# Patient Record
Sex: Female | Born: 1940 | Race: White | Hispanic: No | Marital: Married | Smoking: Never smoker
Health system: Southern US, Community
[De-identification: ages and names within clinical notes are randomized; demographics above are authoritative.]

## PROBLEM LIST (undated history)

## (undated) DIAGNOSIS — R1900 Intra-abdominal and pelvic swelling, mass and lump, unspecified site: Secondary | ICD-10-CM

## (undated) DIAGNOSIS — E7800 Pure hypercholesterolemia, unspecified: Secondary | ICD-10-CM

## (undated) DIAGNOSIS — I10 Essential (primary) hypertension: Secondary | ICD-10-CM

## (undated) DIAGNOSIS — C569 Malignant neoplasm of unspecified ovary: Secondary | ICD-10-CM

## (undated) HISTORY — DX: Intra-abdominal and pelvic swelling, mass and lump, unspecified site: R19.00

## (undated) HISTORY — DX: Malignant neoplasm of unspecified ovary: C56.9

## (undated) HISTORY — DX: Pure hypercholesterolemia, unspecified: E78.00

## (undated) HISTORY — DX: Essential (primary) hypertension: I10

---

## 1998-10-17 ENCOUNTER — Encounter: Payer: Self-pay | Admitting: Internal Medicine

## 1998-10-17 ENCOUNTER — Ambulatory Visit (HOSPITAL_COMMUNITY): Admission: RE | Admit: 1998-10-17 | Discharge: 1998-10-17 | Payer: Self-pay | Admitting: Internal Medicine

## 1999-10-30 ENCOUNTER — Encounter: Payer: Self-pay | Admitting: Internal Medicine

## 1999-10-30 ENCOUNTER — Ambulatory Visit (HOSPITAL_COMMUNITY): Admission: RE | Admit: 1999-10-30 | Discharge: 1999-10-30 | Payer: Self-pay | Admitting: Internal Medicine

## 1999-11-06 ENCOUNTER — Emergency Department (HOSPITAL_COMMUNITY): Admission: EM | Admit: 1999-11-06 | Discharge: 1999-11-06 | Payer: Self-pay | Admitting: Emergency Medicine

## 2000-12-02 ENCOUNTER — Ambulatory Visit (HOSPITAL_COMMUNITY): Admission: RE | Admit: 2000-12-02 | Discharge: 2000-12-02 | Payer: Self-pay | Admitting: Internal Medicine

## 2000-12-02 ENCOUNTER — Encounter: Payer: Self-pay | Admitting: Internal Medicine

## 2001-12-08 ENCOUNTER — Encounter: Payer: Self-pay | Admitting: Internal Medicine

## 2001-12-08 ENCOUNTER — Ambulatory Visit (HOSPITAL_COMMUNITY): Admission: RE | Admit: 2001-12-08 | Discharge: 2001-12-08 | Payer: Self-pay | Admitting: Internal Medicine

## 2003-01-11 ENCOUNTER — Ambulatory Visit (HOSPITAL_COMMUNITY): Admission: RE | Admit: 2003-01-11 | Discharge: 2003-01-11 | Payer: Self-pay | Admitting: Obstetrics and Gynecology

## 2003-01-11 ENCOUNTER — Encounter: Payer: Self-pay | Admitting: Obstetrics and Gynecology

## 2004-06-27 ENCOUNTER — Encounter: Admission: RE | Admit: 2004-06-27 | Discharge: 2004-06-27 | Payer: Self-pay | Admitting: Internal Medicine

## 2005-07-08 ENCOUNTER — Ambulatory Visit (HOSPITAL_COMMUNITY): Admission: RE | Admit: 2005-07-08 | Discharge: 2005-07-08 | Payer: Self-pay | Admitting: Internal Medicine

## 2005-07-30 ENCOUNTER — Encounter: Admission: RE | Admit: 2005-07-30 | Discharge: 2005-07-30 | Payer: Self-pay | Admitting: Internal Medicine

## 2006-07-31 ENCOUNTER — Encounter: Admission: RE | Admit: 2006-07-31 | Discharge: 2006-07-31 | Payer: Self-pay | Admitting: Internal Medicine

## 2010-09-21 ENCOUNTER — Emergency Department (HOSPITAL_COMMUNITY): Payer: Medicare Other

## 2010-09-21 ENCOUNTER — Inpatient Hospital Stay (HOSPITAL_COMMUNITY)
Admission: EM | Admit: 2010-09-21 | Discharge: 2010-09-24 | DRG: 690 | Disposition: A | Discharge status: Home / Self Care | Payer: Medicare Other | Attending: Internal Medicine | Admitting: Internal Medicine

## 2010-09-21 DIAGNOSIS — Z79899 Other long term (current) drug therapy: Secondary | ICD-10-CM

## 2010-09-21 DIAGNOSIS — E785 Hyperlipidemia, unspecified: Secondary | ICD-10-CM | POA: Diagnosis present

## 2010-09-21 DIAGNOSIS — I10 Essential (primary) hypertension: Secondary | ICD-10-CM | POA: Diagnosis present

## 2010-09-21 DIAGNOSIS — N179 Acute kidney failure, unspecified: Secondary | ICD-10-CM | POA: Diagnosis present

## 2010-09-21 DIAGNOSIS — Z7982 Long term (current) use of aspirin: Secondary | ICD-10-CM

## 2010-09-21 DIAGNOSIS — N12 Tubulo-interstitial nephritis, not specified as acute or chronic: Principal | ICD-10-CM | POA: Diagnosis present

## 2010-09-21 DIAGNOSIS — E876 Hypokalemia: Secondary | ICD-10-CM | POA: Diagnosis present

## 2010-09-21 LAB — COMPREHENSIVE METABOLIC PANEL
ALT: 15 U/L (ref 0–35)
AST: 24 U/L (ref 0–37)
Alkaline Phosphatase: 69 U/L (ref 39–117)
BUN: 43 mg/dL — ABNORMAL HIGH (ref 6–23)
CO2: 27 mEq/L (ref 19–32)
Calcium: 9.3 mg/dL (ref 8.4–10.5)
Creatinine, Ser: 2.66 mg/dL — ABNORMAL HIGH (ref 0.4–1.2)
GFR calc Af Amer: 22 mL/min — ABNORMAL LOW (ref >60)
GFR calc non Af Amer: 18 mL/min — ABNORMAL LOW (ref >60)
Glucose, Bld: 156 mg/dL — ABNORMAL HIGH (ref 70–99)
Potassium: 2.8 mEq/L — ABNORMAL LOW (ref 3.5–5.1)
Sodium: 131 mEq/L — ABNORMAL LOW (ref 135–145)
Total Protein: 7 g/dL (ref 6.0–8.3)

## 2010-09-21 LAB — DIFFERENTIAL
Basophils Absolute: 0 10*3/uL (ref 0.0–0.1)
Basophils Relative: 0 % (ref 0–1)
Eosinophils Absolute: 0 10*3/uL (ref 0.0–0.7)
Eosinophils Relative: 0 % (ref 0–5)
Lymphocytes Relative: 4 % — ABNORMAL LOW (ref 12–46)
Lymphs Abs: 0.6 10*3/uL — ABNORMAL LOW (ref 0.7–4.0)
Monocytes Absolute: 0.8 10*3/uL (ref 0.1–1.0)
Monocytes Relative: 6 % (ref 3–12)
Neutro Abs: 12.6 10*3/uL — ABNORMAL HIGH (ref 1.7–7.7)
Neutrophils Relative %: 90 % — ABNORMAL HIGH (ref 43–77)

## 2010-09-21 LAB — URINALYSIS, ROUTINE W REFLEX MICROSCOPIC
Ketones, ur: NEGATIVE mg/dL
Nitrite: NEGATIVE
Protein, ur: 100 mg/dL — AB
Specific Gravity, Urine: 1.018 (ref 1.005–1.030)
Urine Glucose, Fasting: NEGATIVE mg/dL
pH: 5.5 (ref 5.0–8.0)

## 2010-09-21 LAB — URINE MICROSCOPIC-ADD ON

## 2010-09-21 LAB — LIPASE, BLOOD: Lipase: 20 U/L (ref 11–59)

## 2010-09-21 LAB — CBC
HCT: 37.3 % (ref 36.0–46.0)
Hemoglobin: 13.1 g/dL (ref 12.0–15.0)
MCHC: 35.1 g/dL (ref 30.0–36.0)
WBC: 14 10*3/uL — ABNORMAL HIGH (ref 4.0–10.5)

## 2010-09-21 LAB — LACTIC ACID, PLASMA: Lactic Acid, Venous: 1.1 mmol/L (ref 0.5–2.2)

## 2010-09-22 ENCOUNTER — Other Ambulatory Visit: Payer: Self-pay | Admitting: Internal Medicine

## 2010-09-22 LAB — COMPREHENSIVE METABOLIC PANEL
ALT: 23 U/L (ref 0–35)
AST: 43 U/L — ABNORMAL HIGH (ref 0–37)
Albumin: 2.1 g/dL — ABNORMAL LOW (ref 3.5–5.2)
Alkaline Phosphatase: 64 U/L (ref 39–117)
CO2: 24 mEq/L (ref 19–32)
Calcium: 8.4 mg/dL (ref 8.4–10.5)
Creatinine, Ser: 2.08 mg/dL — ABNORMAL HIGH (ref 0.4–1.2)
GFR calc Af Amer: 29 mL/min — ABNORMAL LOW (ref >60)
GFR calc non Af Amer: 24 mL/min — ABNORMAL LOW (ref >60)
Potassium: 3 mEq/L — ABNORMAL LOW (ref 3.5–5.1)
Total Bilirubin: 0.8 mg/dL (ref 0.3–1.2)
Total Protein: 6.1 g/dL (ref 6.0–8.3)

## 2010-09-22 LAB — CBC
HCT: 36.8 % (ref 36.0–46.0)
MCH: 30 pg (ref 26.0–34.0)
MCHC: 34 g/dL (ref 30.0–36.0)
Platelets: 174 10*3/uL (ref 150–400)
RBC: 4.17 MIL/uL (ref 3.87–5.11)
RDW: 12.8 % (ref 11.5–15.5)

## 2010-09-23 LAB — CBC
HCT: 33.5 % — ABNORMAL LOW (ref 36.0–46.0)
Hemoglobin: 11.6 g/dL — ABNORMAL LOW (ref 12.0–15.0)
MCH: 30.4 pg (ref 26.0–34.0)
MCHC: 34.6 g/dL (ref 30.0–36.0)
MCV: 87.7 fL (ref 78.0–100.0)
Platelets: 209 10*3/uL (ref 150–400)
RBC: 3.82 MIL/uL — ABNORMAL LOW (ref 3.87–5.11)
RDW: 12.8 % (ref 11.5–15.5)
WBC: 12.3 10*3/uL — ABNORMAL HIGH (ref 4.0–10.5)

## 2010-09-23 LAB — DIFFERENTIAL
Basophils Absolute: 0 10*3/uL (ref 0.0–0.1)
Basophils Relative: 0 % (ref 0–1)
Eosinophils Absolute: 0.1 10*3/uL (ref 0.0–0.7)
Eosinophils Relative: 1 % (ref 0–5)
Lymphocytes Relative: 11 % — ABNORMAL LOW (ref 12–46)
Lymphs Abs: 1.4 10*3/uL (ref 0.7–4.0)
Monocytes Absolute: 1.4 10*3/uL — ABNORMAL HIGH (ref 0.1–1.0)
Monocytes Relative: 11 % (ref 3–12)
Neutro Abs: 9.4 10*3/uL — ABNORMAL HIGH (ref 1.7–7.7)
Neutrophils Relative %: 77 % (ref 43–77)

## 2010-09-23 LAB — BASIC METABOLIC PANEL
BUN: 25 mg/dL — ABNORMAL HIGH (ref 6–23)
CO2: 23 mEq/L (ref 19–32)
Calcium: 8.3 mg/dL — ABNORMAL LOW (ref 8.4–10.5)
Chloride: 102 mEq/L (ref 96–112)
Creatinine, Ser: 1.68 mg/dL — ABNORMAL HIGH (ref 0.4–1.2)
GFR calc Af Amer: 37 mL/min — ABNORMAL LOW (ref >60)
GFR calc non Af Amer: 30 mL/min — ABNORMAL LOW (ref >60)
Glucose, Bld: 107 mg/dL — ABNORMAL HIGH (ref 70–99)
Potassium: 3.3 mEq/L — ABNORMAL LOW (ref 3.5–5.1)
Sodium: 134 mEq/L — ABNORMAL LOW (ref 135–145)

## 2010-09-24 LAB — CBC
MCH: 29.7 pg (ref 26.0–34.0)
MCHC: 33.9 g/dL (ref 30.0–36.0)
MCV: 87.5 fL (ref 78.0–100.0)
Platelets: 239 10*3/uL (ref 150–400)
RBC: 3.91 MIL/uL (ref 3.87–5.11)
RDW: 13 % (ref 11.5–15.5)
WBC: 13 10*3/uL — ABNORMAL HIGH (ref 4.0–10.5)

## 2010-09-24 LAB — URINE CULTURE
Colony Count: NO GROWTH
Culture: NO GROWTH

## 2010-09-24 LAB — BASIC METABOLIC PANEL
BUN: 21 mg/dL (ref 6–23)
CO2: 23 mEq/L (ref 19–32)
Calcium: 8.7 mg/dL (ref 8.4–10.5)
Chloride: 108 mEq/L (ref 96–112)
Creatinine, Ser: 1.54 mg/dL — ABNORMAL HIGH (ref 0.4–1.2)
GFR calc Af Amer: 41 mL/min — ABNORMAL LOW (ref >60)
GFR calc non Af Amer: 34 mL/min — ABNORMAL LOW (ref >60)
Glucose, Bld: 106 mg/dL — ABNORMAL HIGH (ref 70–99)
Potassium: 3.5 mEq/L (ref 3.5–5.1)

## 2010-09-28 LAB — CULTURE, BLOOD (ROUTINE X 2)
Culture  Setup Time: 201202050047
Culture: NO GROWTH
Culture: NO GROWTH

## 2010-10-07 NOTE — Discharge Summary (Signed)
Joanne Copeland, Joanne Copeland                 ACCOUNT NO.:  [id number]  MEDICAL RECORD NO.:  [id number]           PATIENT TYPE:  I  LOCATION:  1503                         FACILITY:  [facility]  PHYSICIAN:  Jeoffrey Massed, MD    DATE OF BIRTH:  1940/09/13  DATE OF ADMISSION:  09/21/2010 DATE OF DISCHARGE:                        DISCHARGE SUMMARY - REFERRING   PRIMARY CARE PRACTITIONER:  Dr. Gwenyth Bender in [place], [place].  PRIMARY DISCHARGE DIAGNOSES: 1. Bilateral pyelonephritis. 2. Acute renal failure.  SECONDARY DISCHARGE DIAGNOSES: 1. Hypertension. 2. Dyslipidemia.  DISCHARGE MEDICATIONS: 1. Ciprofloxacin 500 mg 1 tablet p.o. twice daily for 7 more days. 2. Aspirin 81 mg 1 tablet p.o. daily. 3. Atenolol 25 mg 1 tablet p.o. daily. 4. Benadryl 25 mg 1 tablet p.o. daily p.r.n. 5. Crestor 5 mg 1 tablet p.o. at bedtime. 6. Ibuprofen 200 mg 2 tablets p.o. daily p.r.n. 7. Fenofibrate acid 45 mg 1 capsule p.o. at bedtime.  CONSULTATIONS:  None.  BRIEF HPI:  The patient is a very pleasant 70 year old female who is from [place]. [place], [place], and was here visiting her daughter when she started having nausea, vomiting, and lower abdominal pain along with fever.  She was then evaluated in the emergency room and was found to have urinary tract infection consistent with pyelonephritis.  She was then admitted to the hospitalist service for further evaluation and treatment.  PERTINENT LABORATORY DATA: 1. Urine culture negative but this was done when the patient was     already on antibiotics. 2. Blood cultures are negative to date. 3. Discharge laboratory data shows a WBC of 13, hemoglobin of 11.6,     and a platelet count of 239, 000. 4. Chemistries on discharge shows a creatinine of 1.54. 5. CBC on admission showed a WBC of 14.0 and a creatinine of 2.66.  RADIOLOGICAL STUDIES:  CT of the abdomen and pelvis showed kidneys appeared to be swollen with perinephric edema.  Findings  suggest bilateral pyelonephritis.  There is no evidence of stone disease or hydroureteronephrosis.  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  BRIEF HOSPITAL COURSE: 1. Bilateral pyelonephritis.  The patient presented with a few days'     history of nausea, vomiting, abdominal pain, and fever.  She had     evidence of leukocytosis on her labs as well as acute renal     failure.  Her urinalysis was positive for leukocytes, and a     microscopic analysis showed many bacteria with too numerous to     count wbc's and rbc's.  Her symptomatology and radiological studies     were consistent with urinary tract infection.  She was then started     on IV fluids and ciprofloxacin.  Urine cultures were obtained, and     blood cultures were also obtained.  However, there are so far     negative.  However, the patient has seen significant clinical     improvement and has been afebrile for the past 48 hours.  Her renal     failure has also been down  trending.  The plan at this point is to     discontinue IV ciprofloxacin and then to transition to p.o.     ciprofloxacin for 7 more days starting from tomorrow.  She has been     told to return to the emergency room if she redevelops some of     these symptoms when she goes home.  Otherwise, she will follow up     with her primary care practitioner in [place] in the next 1-2     weeks. 2. Acute renal failure.  This is almost resolved with IV fluids.  The     patient does not have any nausea or vomiting and is now able to     tolerate oral diet with no issues at all.  It is suggested that she     continue to keep herself well hydrated and not to take any     nonsteroidal anti-inflammatory medications until she sees her     primary care practitioner.  She will need repeat chemistries in the     next few weeks to make sure that her renal failure has totally     resolved. 3.  Hypertension.  This has been stable.  Because of her renal failure,     her diuretics were on hold.  She was only continued on atenolol.     Her blood pressure is controlled only with atenolol while here in     the hospital.  She will follow up with her primary care     practitioner in the next few weeks to see if she needs to come back     on to the diuretics. 4. Dyslipidemia.  This was stable.  She is to resume her usual     medications.  DISPOSITION:  The patient will be discharged home today.  FOLLOWUP INSTRUCTIONS: 1. The patient will follow up with her primary care practitioner     within 1-2 weeks upon discharge.  The patient will need repeat     chemistries at the time of followup. 2. The patient is to return to any local emergency room if she has     recurrence of these symptoms prior to her returning back to     [place].  TOTAL TIME SPENT:  45 minutes.     Jeoffrey Massed, MD     SG/MEDQ  D:  09/24/2010  T:  09/24/2010  Job:  [id number]  cc:   Dr. Gwenyth Bender [address] [place], [address] [place]. [place] [address]  Electronically Signed by Jeoffrey Massed  on 10/07/2010 04:23:26 PM

## 2010-10-09 NOTE — H&P (Signed)
NAMEABIAGEAL, BLOWE NO.:  [id number]  MEDICAL RECORD NO.:  [id number]           PATIENT TYPE:  I  LOCATION:  1503                         FACILITY:  [facility]  PHYSICIAN:  Massie Maroon, MD        DATE OF BIRTH:  05/06/1941  DATE OF ADMISSION:  09/21/2010 DATE OF DISCHARGE:                             HISTORY & PHYSICAL   CHIEF COMPLAINT:  Nausea, vomiting, shaking, and lower abdominal cramping.  HISTORY OF PRESENT ILLNESS:  A 70 year old female with a history of hypertension, hyperlipidemia, apparently started having stomach cramping which she describes as bilateral lower abdominal cramps and shaking and freezing and fever starting Wednesday.  It was unremitting and then she developed nausea and vomiting (no blood) and continued to have this lower abdominal cramping.  She presented to the ED today because of continued fever and was noted to have urinary tract infection as well as evidence of pyelonephritis on CT scan.  The patient will be admitted for nausea, vomiting, and pyelonephritis.  PAST MEDICAL HISTORY: 1. Hypertension. 2. Hyperlipidemia.  PAST SURGICAL HISTORY:  Vein stripping.  SOCIAL HISTORY:  The patient does not smoke.  She occasionally drinks socially.  She has 11 siblings, 8 of whom are alive.  FAMILY HISTORY:  Mother died of stroke at age 52, was a nonsmoker.  Her father died at age 69 of heart attack and was a smoker.  ALLERGIES:  HONEYDEW AND WATERMELON AND CANTALOUPE, but no known drug allergies.  MEDICATIONS: 1. Enteric-coated aspirin 81 mg p.o. daily. 2. Atenolol 25 mg p.o. daily. 3. Triamterene/hydrochlorothiazide 37.5/25 mg 1 p.o. daily. 4. Crestor 5 mg p.o. daily. 5. Trilipix 45 mg p.o. daily  REVIEW OF SYSTEMS:  Negative for all 10 organ systems except for pertinent positives stated above, no evidence of gross hematuria.  PHYSICAL EXAMINATION:  VITAL SIGNS:  Temperature 98.5, pulse 63, blood pressure 98/64, pulse oximetry  100%. HEENT:  Anicteric, EOMI, no nystagmus, pupils 1.5 mm, symmetric direct consensual, near reflexes intact.  Mucous membranes moist. NECK:  No JVD.  No bruit.  No thyromegaly.  No adenopathy. HEART:  Regular rate and rhythm.  S1 and S2.  No murmurs, gallops, or rubs. LUNGS:  Clear to auscultation bilaterally. ABDOMEN:  Soft, nontender, nondistended.  Positive bowel sounds. EXTREMITIES:  No cyanosis, clubbing, or edema. SKIN:  No rashes, lymph nodes, or adenopathy. NEURO EXAM:  Nonfocal, cranial nerves II through XII intact.  Reflexes 2+, symmetric, diffuse with downgoing toes bilaterally, motor strength 5/5 in all 4 extremities, pinprick intact.  LABORATORY DATA:  WBC 14.0, hemoglobin 13.1, platelet count 193.  Sodium 131, potassium 2.8 (low), BUN 43, creatinine 2.66, AST 24, ALT 15, glucose 156.  Lipase 20.  Urinalysis, nitrite negative, leukocyte esterase large.  Urinalysis, WBCs too numerous to count, RBCs too numerous to count, granular casts.  Lactic acid 1.1.  CT scan of the abdomen and pelvis shows, impression, the kidneys appear swollen and there is perinephric edema, suggestive of bilateral pyelonephritis.  I do not see evidence of stone disease or hydronephrosis.  Incidentally, the patient apprise 1 cm angiomyolipoma dorsally  in the right kidney, not of acute clinical relevance.  ASSESSMENT/PLAN: 1. Pyelonephritis:  Blood cultures x2 sets pending.  Urine culture     obviously pending.  The patient placed on Cipro IV, pharmacy to     dose. 2. Acute renal failure:  Check urine sodium, urine creatinine, urine     eosinophils.  The patient's kidney function should improve with     hydration.  If not, may need further evaluation obviously.  We will     also discontinue triamterene/hydrochlorothiazide for now. 3. Hypokalemia:  Replete potassium. 4. Hypertension.  Continue atenolol. 5. Hyperlipidemia.  Continue Crestor and Trilipix. 6. Hyponatremia:  Should resolve with  normal saline.  However, if the     patient remains persistently hyponatremic, consider checking a     serum osm, TSH, cortisol, urine osm, urine sodium. 7. Deep venous thrombosis prophylaxis.  SCDs.     Massie Maroon, MD     JYK/MEDQ  D:  09/21/2010  T:  09/22/2010  Job:  [id number]  cc:   Dr. Mervin Kung, [facility]  Electronically Signed by Pearson Grippe MD on 10/09/2010 02:06:27 PM

## 2014-01-05 DIAGNOSIS — C5700 Malignant neoplasm of unspecified fallopian tube: Secondary | ICD-10-CM | POA: Insufficient documentation

## 2014-01-05 HISTORY — PX: OTHER SURGICAL HISTORY: SHX169

## 2014-01-05 HISTORY — PX: TOTAL ABDOMINAL HYSTERECTOMY W/ BILATERAL SALPINGOOPHORECTOMY: SHX83

## 2014-02-07 ENCOUNTER — Encounter: Payer: Self-pay | Admitting: *Deleted

## 2014-02-09 ENCOUNTER — Telehealth: Payer: Self-pay | Admitting: Oncology

## 2014-02-09 ENCOUNTER — Ambulatory Visit: Payer: Medicare Other | Attending: Gynecologic Oncology | Admitting: Gynecologic Oncology

## 2014-02-09 ENCOUNTER — Encounter: Payer: Self-pay | Admitting: Gynecologic Oncology

## 2014-02-09 VITALS — BP 160/76 | HR 85 | Temp 97.9°F | Resp 18 | Ht 64.5 in | Wt 152.0 lb

## 2014-02-09 DIAGNOSIS — C569 Malignant neoplasm of unspecified ovary: Secondary | ICD-10-CM | POA: Insufficient documentation

## 2014-02-09 DIAGNOSIS — E7800 Pure hypercholesterolemia, unspecified: Secondary | ICD-10-CM | POA: Insufficient documentation

## 2014-02-09 DIAGNOSIS — C563 Malignant neoplasm of bilateral ovaries: Secondary | ICD-10-CM

## 2014-02-09 DIAGNOSIS — Z7982 Long term (current) use of aspirin: Secondary | ICD-10-CM | POA: Insufficient documentation

## 2014-02-09 DIAGNOSIS — Z79899 Other long term (current) drug therapy: Secondary | ICD-10-CM | POA: Insufficient documentation

## 2014-02-09 DIAGNOSIS — C5700 Malignant neoplasm of unspecified fallopian tube: Secondary | ICD-10-CM

## 2014-02-09 DIAGNOSIS — C561 Malignant neoplasm of right ovary: Secondary | ICD-10-CM

## 2014-02-09 DIAGNOSIS — C562 Malignant neoplasm of left ovary: Secondary | ICD-10-CM

## 2014-02-09 DIAGNOSIS — Z90710 Acquired absence of both cervix and uterus: Secondary | ICD-10-CM | POA: Insufficient documentation

## 2014-02-09 DIAGNOSIS — I10 Essential (primary) hypertension: Secondary | ICD-10-CM | POA: Insufficient documentation

## 2014-02-09 NOTE — Telephone Encounter (Signed)
LEFT MESSAGE FOR PATIENT TO RETURN CALL TO SCHEDULE NP APPT.  °

## 2014-02-09 NOTE — Patient Instructions (Signed)
Follow-up with Dr. Marko Plume Please consider reading material at [place].gov for more information about your cancer.  Follow-up with Gyn Onc in 2 months  Thank you very much Ms. KHILA PAPP for allowing me to provide care for you today.  I appreciate your confidence in choosing our Gynecologic Oncology team.  If you have any questions about your visit today please call our office and we will get back to you as soon as possible.  Francetta Found. Brewster MD., PhD Gynecologic Oncology
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
## 2014-02-10 ENCOUNTER — Telehealth: Payer: Self-pay | Admitting: Oncology

## 2014-02-10 NOTE — Telephone Encounter (Signed)
S/W PATIENT AND GAVE NP APPT FOR 07/08 @ 3 W/DR. LIVESAY.  REFERRING DR. Abigail Butts BREWSTER DX- OVARIAN CA, UNSPECIFIED LATERALITY

## 2014-02-10 NOTE — Telephone Encounter (Signed)
C/D 02/10/14 for appt. 02/22/14

## 2014-02-21 ENCOUNTER — Other Ambulatory Visit: Payer: Self-pay | Admitting: Oncology

## 2014-02-21 DIAGNOSIS — C5700 Malignant neoplasm of unspecified fallopian tube: Secondary | ICD-10-CM

## 2014-02-22 ENCOUNTER — Encounter: Payer: Self-pay | Admitting: Oncology

## 2014-02-22 ENCOUNTER — Encounter: Payer: Self-pay | Admitting: *Deleted

## 2014-02-22 ENCOUNTER — Other Ambulatory Visit: Payer: Self-pay | Admitting: Oncology

## 2014-02-22 ENCOUNTER — Telehealth: Payer: Self-pay | Admitting: Oncology

## 2014-02-22 ENCOUNTER — Encounter: Payer: Self-pay | Admitting: General Practice

## 2014-02-22 ENCOUNTER — Ambulatory Visit: Payer: Medicare Other

## 2014-02-22 ENCOUNTER — Ambulatory Visit (HOSPITAL_BASED_OUTPATIENT_CLINIC_OR_DEPARTMENT_OTHER): Payer: Medicare Other | Admitting: Oncology

## 2014-02-22 ENCOUNTER — Other Ambulatory Visit (HOSPITAL_BASED_OUTPATIENT_CLINIC_OR_DEPARTMENT_OTHER): Payer: Medicare Other

## 2014-02-22 ENCOUNTER — Telehealth: Payer: Self-pay | Admitting: *Deleted

## 2014-02-22 ENCOUNTER — Other Ambulatory Visit: Payer: Medicare Other

## 2014-02-22 VITALS — BP 175/85 | HR 85 | Temp 98.2°F | Resp 18 | Ht 64.0 in | Wt 152.0 lb

## 2014-02-22 DIAGNOSIS — C5700 Malignant neoplasm of unspecified fallopian tube: Secondary | ICD-10-CM

## 2014-02-22 DIAGNOSIS — C5702 Malignant neoplasm of left fallopian tube: Secondary | ICD-10-CM

## 2014-02-22 LAB — CBC WITH DIFFERENTIAL/PLATELET
BASO%: 0.7 % (ref 0.0–2.0)
Basophils Absolute: 0.1 10*3/uL (ref 0.0–0.1)
EOS%: 3.2 % (ref 0.0–7.0)
Eosinophils Absolute: 0.2 10*3/uL (ref 0.0–0.5)
HCT: 43.4 % (ref 34.8–46.6)
HGB: 14.1 g/dL (ref 11.6–15.9)
LYMPH%: 29.5 % (ref 14.0–49.7)
MCH: 29.7 pg (ref 25.1–34.0)
MCHC: 32.4 g/dL (ref 31.5–36.0)
MCV: 91.6 fL (ref 79.5–101.0)
MONO#: 0.6 10*3/uL (ref 0.1–0.9)
MONO%: 8.8 % (ref 0.0–14.0)
NEUT#: 4.1 10*3/uL (ref 1.5–6.5)
NEUT%: 57.8 % (ref 38.4–76.8)
PLATELETS: 276 10*3/uL (ref 145–400)
RBC: 4.73 10*6/uL (ref 3.70–5.45)
RDW: 16.3 % — ABNORMAL HIGH (ref 11.2–14.5)
WBC: 7.1 10*3/uL (ref 3.9–10.3)
lymph#: 2.1 10*3/uL (ref 0.9–3.3)

## 2014-02-22 LAB — COMPREHENSIVE METABOLIC PANEL (CC13)
ALT: 13 U/L (ref 0–55)
AST: 17 U/L (ref 5–34)
Albumin: 4 g/dL (ref 3.5–5.0)
Alkaline Phosphatase: 53 U/L (ref 40–150)
Anion Gap: 10 mEq/L (ref 3–11)
BUN: 17.8 mg/dL (ref 7.0–26.0)
CALCIUM: 10.2 mg/dL (ref 8.4–10.4)
CO2: 28 meq/L (ref 22–29)
Chloride: 100 mEq/L (ref 98–109)
Creatinine: 1 mg/dL (ref 0.6–1.1)
Glucose: 120 mg/dl (ref 70–140)
POTASSIUM: 4 meq/L (ref 3.5–5.1)
Sodium: 139 mEq/L (ref 136–145)
Total Bilirubin: 0.5 mg/dL (ref 0.20–1.20)
Total Protein: 7.3 g/dL (ref 6.4–8.3)

## 2014-02-22 NOTE — Telephone Encounter (Signed)
Patient scheduled as a new patient to see Dr. Marko Plume today for discussion of chemotherapy.  Dr. Marko Plume requests that patient attend chemotherapy class prior to her appointment. I arranged for patient to have a class today at 2:00 PM.  I called to discuss with patient who said she would come for the class at 2:00.  Scheduling notified.

## 2014-02-22 NOTE — Progress Notes (Signed)
Met Ms Mowbray and her family (husband, dtr Bonnita Nasuti, [employer]) at chemo class and followed up with family at art table.  Ms Menter is from this area and is staying with her dtr locally, but she also has her own home in [place] and recently sold a home in [place] [facility] (where she and her husband spent ca half their time).  Family members appear to have strong, supportive, loving relationships.  [place], [place]

## 2014-02-22 NOTE — Telephone Encounter (Signed)
per pof to sch pt appt-sent email to MW to sch trmt-adv pt I will call with times for trmt once MW reply-pt understood

## 2014-02-22 NOTE — Patient Instructions (Addendum)
If you have copies of most recent mammograms and of CT scans done around surgery, please bring to next visit; Dr Marko Plume will get these reports from [place] if you don't have them.   We will send prescriptions to your pharmacy   1.decadron (dexamethasone, steroid) 4 mg. Take five tablets +(=20 mg) with food 12 hrs before taxol chemotherapy and five tablets with food 6 hrs before taxol   2.zofran (ondansetron) 8mg  One tablet every 8 hrs as needed for nausea. Will not make you drowsy. Fine to take one tablet AM after chemo whether or not any nausea then, to extend coverage for nausea a bit longer. Other than that dose, fine to take just as needed for nausea   3.ativan (lorazepam) 0.5 mg. One tablet swallow or dissolve under tongue every 6 hrs as needed for nausea. WIll make you drowsy and a little forgetful around each dose. Fine to take one tablet at bedtime night of chemo whether or not any nausea.   You can call any time if needed [phone]

## 2014-02-22 NOTE — Progress Notes (Signed)
[place] NEW PATIENT EVALUATION   Name: Joanne Copeland Date: 02/22/2014 MRN: [id number] DOB: May 14, 1941  REFERRING PHYSICIAN: Janie Morning, MD CC:(Joanne Copeland, gyn onc [address], [place]), ([address] [place], [place], [place], PCP) Joanne Copeland)   REASON FOR REFERRAL: IIIA serous fallopian carcinoma, for adjuvant chemotherapy.  Records from physician in [place], including pathology, as well as Joanne Copeland consultation information in this EMR reviewed by this MD. Records from [place] will be scanned into this EMR Patient reports also having had CT in [place], which I do not find in accompanying records.  History also from patient and daughter now.   HISTORY OF PRESENT ILLNESS:Joanne Copeland is a 73 y.o. female who is seen in consultation, together with daughter and husband, at the request of Joanne Copeland, for consideration of adjuvant chemotherapy for IIIA serous fallopian carcinoma which was optimally debulked in [place] on 01-02-14. Start of treatment has been delayed as patient moved back to [place] after follow up visit with Joanne Joanne Copeland on 01-18-14. She saw Joanne Copeland in consultation on 02-09-14, with discussion of intraperitoneal vs dose dense chemotherapy; at the time of that consultation patient seemed reluctant to consider chemotherapy and mentioned alternative treatments.  She and family attended chemotherapy education class prior to visit today.  Patient had been in usual good health until acute abdominal pain for which she was seen in ED in [place] on 01-02-14 and admitted to [facility] Per patient, CT scans were done, tho I do not have those reports; laboratory results also do not accompany (pre op CA125 not known). She was taken to optimal debulking by Joanne Joanne Copeland on 01-05-14 (hysterectomy, BSO, PPLND,omentectomy, pelvic peritonectomy and appendectomy. 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 No washings reported in this information. Pathologic stage [id number] (stage IIIA). She was discharged home on 01-11-14. She was doing well from the surgery when seen by Joanne Joanne Copeland on 01-18-14, and has contiued to recover from that without problems. She was not on home lovenox.   Daughter very pleasant and supportive; husband pleasant but does not contribute to discussion at all (note some dementia).  REVIEW OF SYSTEMS as above, also: Eating regular diet. Weight down 12-15 lbs with surgery, usual weight ~ 155. No residual pain from zoster left abdomen. No HA. Good visual acuity with contacts No environmental allergies. No dental concerns, no thyroid symptoms, no respiratory or cardiac symptoms Easy IV access. No bleeding or blood clots. Constipation controlled with senokot. No arthritis. No swelling LE.   Remainder of full 10 point review of systems negative.   ALLERGIES: Review of patient's allergies indicates no known allergies.  No known drug allergies, tho possible nausea with Percocet. 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 That report is not included in outside medical records available now Last mammograms done in [place] ~ March 2015, also not included in outside information available presently. Patient tells me that there was  some finding on exam of left breast which apparently was stable by  mammogram (?). Last mammogram in this EMR was from [facility] 2007 without findings of concern, however she also had left follow up in 2006 for question left mass. Zoster left abdomen 10-2013  CURRENT MEDICATIONS: reviewed as listed now in EMR. Prescriptions sent for ativan, zofran and decadron. Written and oral instructions given as follows: "We will send prescriptions to your pharmacy   1.decadron (dexamethasone, steroid) 4 mg. Take five tablets +(=20 mg) with food 12 hrs before taxol chemotherapy and five tablets with food 6 hrs before taxol   2.zofran (ondansetron) 8mg  One tablet every 8 hrs as needed for nausea. Will not make you drowsy. Fine to take one tablet AM after chemo whether or not any nausea then, to extend coverage for nausea a bit longer. Other than that dose, fine to take just as needed for nausea   3.ativan (lorazepam) 0.5 mg. One tablet swallow or dissolve under tongue every 6 hrs as needed for nausea. WIll make you drowsy and a little forgetful around each dose. Fine to take one tablet at bedtime night of chemo whether or not any nausea. "  PHARMACY [facility]  SOCIAL HISTORY:  Originally from [place], moved to [place] 17 years ago, had just sold home in [place] before this diagnosis and surgery. She and husband are staying at a lake in [place] for the summer; daughter lives in [place] and patient plans to stay with her as needed during chemotherapy; daughter runs home cleaning service, can adjust work as needed to assist.  Never smoker, wine + cocktail qhs. Retired from office work, previously volunteered at [employer]. Husband has some dementia. Two daughters, 4 grandchildren ages 43 to 87. Prefer Mondays for treatments.    FAMILY HISTORY:   Father died MI age 60 Mother died CVA age 10 9 siblings: brother with prostate cancer, sister died of "medical overdose", sister with thyroid disease, brother HTN 2 daughters healthy 4 grands healthy          PHYSICAL EXAM:  height is 5\' 4"  (1.626 m) and weight is 152 lb (68.947 kg). Her oral temperature is 98.2 F (36.8 C). Her blood pressure is 175/85 and her pulse is 85. Her respiration is 18.  Alert, pleasant, cooperative lady, easily mobile and ambulatory, looks stated age.  HEENT: normal hair pattern, PERRL, not icteric. Oral mucosa moist and clear, no obvious acute dental problems. Neck supple without JVD or thyroid mass  RESPIRATORY:lungs clear to A and P  CARDIAC/ VASCULAR: heart RRR, no gallop. Peripheral pulses symmetrical and intact  ABDOMEN: Soft, not tender, not distended. Surgical incision appears to be healing well, entirely closed, not tender. Normal bowel sounds. No appreciable mass or HSM.  LYMPH NODES: no cervical, supraclavicular, axillary or inguinal adenopathy  BREASTS: Right without dominant mass, skin or nipple findings, axilla benign. Left breast with thickening from 1200 - 2:00 extending ~ 3 x 4 cm, no overlying skin change, no other dominant mass or skin/nipple findings of concern and nothing palpable left axilla.  NEUROLOGIC: speech fluent and appropriate. CN, motor, sensory, cerebellar nonfocal. PSYCH normal mood and affect  SKIN: without rash, ecchymosis, petechiae  MUSCULOSKELETAL: back not tender. Good and symmetrical muscle mass. LE without edema, cords, tenderness    LABORATORY DATA:  Results for orders placed in visit on 02/22/14 (from the past 48 hour(s))  COMPREHENSIVE METABOLIC PANEL (PF79)     Status: None   Collection Time  02/22/14  3:17 PM      Result Value Ref Range   Sodium 139  136 - 145 mEq/L   Potassium 4.0  3.5 - 5.1 mEq/L   Chloride 100  98 - 109 mEq/L   CO2 28  22 - 29 mEq/L   Glucose 120  70 - 140 mg/dl   BUN 17.8  7.0 - 26.0 mg/dL   Creatinine 1.0  0.6 - 1.1 mg/dL   Total Bilirubin 0.50  0.20 - 1.20 mg/dL   Alkaline Phosphatase 53  40 - 150 U/L   AST 17  5 - 34 U/L   ALT 13  0 - 55 U/L   Total Protein 7.3  6.4 - 8.3  g/dL   Albumin 4.0  3.5 - 5.0 g/dL   Calcium 10.2  8.4 - 10.4 mg/dL   Anion Gap 10  3 - 11 mEq/L  CBC WITH DIFFERENTIAL     Status: Abnormal   Collection Time    02/22/14  3:17 PM      Result Value Ref Range   WBC 7.1  3.9 - 10.3 10e3/uL   NEUT# 4.1  1.5 - 6.5 10e3/uL   HGB 14.1  11.6 - 15.9 g/dL   HCT 43.4  34.8 - 46.6 %   Platelets 276  145 - 400 10e3/uL   MCV 91.6  79.5 - 101.0 fL   MCH 29.7  25.1 - 34.0 pg   MCHC 32.4  31.5 - 36.0 g/dL   RBC 4.73  3.70 - 5.45 10e6/uL   RDW 16.3 (*) 11.2 - 14.5 %   lymph# 2.1  0.9 - 3.3 10e3/uL   MONO# 0.6  0.1 - 0.9 10e3/uL   Eosinophils Absolute 0.2  0.0 - 0.5 10e3/uL   Basophils Absolute 0.1  0.0 - 0.1 10e3/uL   NEUT% 57.8  38.4 - 76.8 %   LYMPH% 29.5  14.0 - 49.7 %   MONO% 8.8  0.0 - 14.0 %   EOS% 3.2  0.0 - 7.0 %   BASO% 0.7  0.0 - 2.0 %      CA 125 11.3.   Note preoperative CA125 not available at time of this visit  PATHOLOGY: As noted above from [facility] 01-05-14  RADIOGRAPHY: No results found. Per patient, CT done during admission in [place] and mammograms done in [place] ~ March 2015. She will bring copies to next visit if she has these in other records at home, otherwise we will need to request this information. (Other information faxed from [facility] by Gigi Gin, phone [phone])  DISCUSSION: Patient and daughter understand reasons for adjuvant chemotherapy recommendation and are now in agreement with this. We have discussed schedule for dose dense carbo taxol, reviewed possible side effects and discussed antiemetics, hydration, diet and activity. She prefers treatment on Mondays if possible. I will see her back prior to day 8 cycle 1. She will do full decadron premeds for day 1 cycle 1, then we will decrease to just 12 hour prior dose if not problems.     IMPRESSION / PLAN:  1.IIIA high grade serous carcinoma of left fallopian tube: post optimal debulking 01-05-14 and to begin dose dense  taxol carboplatin early next week. I will see her back 03-03-14 with labs. 2.hypertension 3.colon polyps: due colonoscopy fall 2015 4.thickening left breast on PE with history of some finding on that side previously: will review recent mammogram information when that is received 5.elevated cholesterol 6.social: has just moved  back to [place] area after 17 years away. Needs to reestablish with PCP etc. Husband with some dementia.      Patient and accompanying individuals have had questions answered to their satisfaction and are in agreement with plan above. They can contact this office for questions or concerns at any time prior to next scheduled visit.  Time spent 55 min, including >50% discussion and coordination of care.  Consent obtained. 6 cycles dose dense carbo taxol planned, in curative attempt. Antiemetics prescribed.    LIVESAY,LENNIS P, MD 02/22/2014 5:18 PM

## 2014-02-22 NOTE — Progress Notes (Signed)
Checked in new patient with no financial issues prior to seeing the dr. She has appt card and has not been out of the country. °

## 2014-02-23 ENCOUNTER — Telehealth: Payer: Self-pay | Admitting: *Deleted

## 2014-02-23 ENCOUNTER — Other Ambulatory Visit: Payer: Self-pay | Admitting: *Deleted

## 2014-02-23 LAB — CA 125: CA 125: 11.3 U/mL (ref 0.0–30.2)

## 2014-02-23 MED ORDER — DEXAMETHASONE 4 MG PO TABS: ORAL_TABLET | ORAL | Status: DC

## 2014-02-23 MED ORDER — ONDANSETRON HCL 8 MG PO TABS: 8.0000 mg | ORAL_TABLET | Freq: Three times a day (TID) | ORAL | Status: DC | PRN

## 2014-02-23 MED ORDER — LORAZEPAM 0.5 MG PO TABS: ORAL_TABLET | ORAL | Status: DC

## 2014-02-23 NOTE — Telephone Encounter (Signed)
Per staff message and POF I have scheduled appts. Advised scheduler of appts. No available on 7/13 for treatment, moved to 7/14. Advised schedule to move labs  JMW

## 2014-02-27 ENCOUNTER — Telehealth: Payer: Self-pay | Admitting: *Deleted

## 2014-02-27 ENCOUNTER — Other Ambulatory Visit (HOSPITAL_BASED_OUTPATIENT_CLINIC_OR_DEPARTMENT_OTHER): Payer: Medicare Other

## 2014-02-27 ENCOUNTER — Other Ambulatory Visit: Payer: Self-pay | Admitting: *Deleted

## 2014-02-27 DIAGNOSIS — C5700 Malignant neoplasm of unspecified fallopian tube: Secondary | ICD-10-CM

## 2014-02-27 LAB — CBC WITH DIFFERENTIAL/PLATELET
BASO%: 0.1 % (ref 0.0–2.0)
Basophils Absolute: 0 10*3/uL (ref 0.0–0.1)
EOS%: 0 % (ref 0.0–7.0)
Eosinophils Absolute: 0 10*3/uL (ref 0.0–0.5)
HCT: 43.9 % (ref 34.8–46.6)
HGB: 14.3 g/dL (ref 11.6–15.9)
LYMPH%: 7.3 % — ABNORMAL LOW (ref 14.0–49.7)
MCH: 29.9 pg (ref 25.1–34.0)
MCHC: 32.6 g/dL (ref 31.5–36.0)
MCV: 91.6 fL (ref 79.5–101.0)
MONO#: 0 10*3/uL — ABNORMAL LOW (ref 0.1–0.9)
MONO%: 0.4 % (ref 0.0–14.0)
NEUT#: 7.2 10*3/uL — ABNORMAL HIGH (ref 1.5–6.5)
NEUT%: 92.2 % — ABNORMAL HIGH (ref 38.4–76.8)
Platelets: 264 10*3/uL (ref 145–400)
RBC: 4.79 10*6/uL (ref 3.70–5.45)
RDW: 16.2 % — ABNORMAL HIGH (ref 11.2–14.5)
WBC: 7.8 10*3/uL (ref 3.9–10.3)
lymph#: 0.6 10*3/uL — ABNORMAL LOW (ref 0.9–3.3)

## 2014-02-27 NOTE — Telephone Encounter (Signed)
Attempted to call patient and daughter to assure understanding of instructions for Dex tonight at 12 and 6hours pre chemo. Left detailed voicemail to call us back if needed and speak to oncall, otherwise we will see with treatment tomorrow.

## 2014-02-27 NOTE — Telephone Encounter (Signed)
Message copied by Verlon Setting on Mon Feb 27, 2014  5:30 PM ------      Message from: Gordy Levan      Created: Sun Feb 26, 2014  8:25 PM       For first chemo 7-14, dose dense taxol (weekly, no skip week) and [place] every 3 weeks.      Please speak with patient or daughter on 7-13:       review decadron five tabs with food 12 hrs and 6 hrs before first taxol. Remind her to take ativan night of first chem whether or not any nausea and take zofran am after first chemo whether or not any nausea.'      She may need to take senokot for a few days after chemo if constipation      Be sure she knows appointments and how to contact office if needed.             ------

## 2014-02-28 ENCOUNTER — Other Ambulatory Visit: Payer: Self-pay | Admitting: Oncology

## 2014-02-28 ENCOUNTER — Ambulatory Visit (HOSPITAL_BASED_OUTPATIENT_CLINIC_OR_DEPARTMENT_OTHER): Payer: Medicare Other

## 2014-02-28 ENCOUNTER — Telehealth: Payer: Self-pay | Admitting: Oncology

## 2014-02-28 VITALS — BP 186/70 | HR 69 | Temp 97.0°F | Resp 18

## 2014-02-28 DIAGNOSIS — C5702 Malignant neoplasm of left fallopian tube: Secondary | ICD-10-CM

## 2014-02-28 DIAGNOSIS — C5700 Malignant neoplasm of unspecified fallopian tube: Secondary | ICD-10-CM

## 2014-02-28 DIAGNOSIS — Z5111 Encounter for antineoplastic chemotherapy: Secondary | ICD-10-CM

## 2014-02-28 MED ORDER — DEXAMETHASONE SODIUM PHOSPHATE 20 MG/5ML IJ SOLN
INTRAMUSCULAR | Status: AC
  Filled 2014-02-28: qty 5

## 2014-02-28 MED ORDER — FAMOTIDINE IN NACL 20-0.9 MG/50ML-% IV SOLN
INTRAVENOUS | Status: AC
  Filled 2014-02-28: qty 50

## 2014-02-28 MED ORDER — DIPHENHYDRAMINE HCL 50 MG/ML IJ SOLN
50.0000 mg | Freq: Once | INTRAMUSCULAR | Status: AC
  Administered 2014-02-28: 50 mg via INTRAVENOUS

## 2014-02-28 MED ORDER — DEXAMETHASONE SODIUM PHOSPHATE 20 MG/5ML IJ SOLN
20.0000 mg | Freq: Once | INTRAMUSCULAR | Status: AC
  Administered 2014-02-28: 20 mg via INTRAVENOUS

## 2014-02-28 MED ORDER — PACLITAXEL CHEMO INJECTION 300 MG/50ML
80.0000 mg/m2 | Freq: Once | INTRAVENOUS | Status: AC
  Administered 2014-02-28: 138 mg via INTRAVENOUS
  Filled 2014-02-28: qty 23

## 2014-02-28 MED ORDER — ONDANSETRON 16 MG/50ML IVPB (CHCC)
16.0000 mg | Freq: Once | INTRAVENOUS | Status: AC
  Administered 2014-02-28: 16 mg via INTRAVENOUS

## 2014-02-28 MED ORDER — CARBOPLATIN CHEMO INJECTION 600 MG/60ML
486.6000 mg | Freq: Once | INTRAVENOUS | Status: AC
  Administered 2014-02-28: 490 mg via INTRAVENOUS
  Filled 2014-02-28: qty 49

## 2014-02-28 MED ORDER — DIPHENHYDRAMINE HCL 50 MG/ML IJ SOLN
INTRAMUSCULAR | Status: AC
  Filled 2014-02-28: qty 1

## 2014-02-28 MED ORDER — ONDANSETRON 16 MG/50ML IVPB (CHCC)
INTRAVENOUS | Status: AC
  Filled 2014-02-28: qty 16

## 2014-02-28 MED ORDER — FAMOTIDINE IN NACL 20-0.9 MG/50ML-% IV SOLN
20.0000 mg | Freq: Once | INTRAVENOUS | Status: AC
Start: 2014-02-28 — End: 2014-02-28
  Administered 2014-02-28: 20 mg via INTRAVENOUS

## 2014-02-28 MED ORDER — SODIUM CHLORIDE 0.9 % IV SOLN
Freq: Once | INTRAVENOUS | Status: AC
  Administered 2014-02-28: 09:00:00 via INTRAVENOUS

## 2014-02-28 NOTE — Progress Notes (Signed)
Ok to treat without magnesium level today per Dr. Marko Plume.  Patients blood pressure slowly increasing, Dr. Marko Plume notified.   Hold Taxol for about 20 minutes, retake blood pressure, if no improvement call back per Dr. Marko Plume.  [place] Dr. Marko Plume paged in regards to patient's blood pressure, continue infusion per Dr. Marko Plume.

## 2014-02-28 NOTE — Telephone Encounter (Signed)
cld pt to adv of time & date of sch-pt stated got updated copy today

## 2014-02-28 NOTE — Patient Instructions (Addendum)
[facility] Discharge Instructions for Patients Receiving Chemotherapy  Today you received the following chemotherapy agents Taxol/Carboplatin To help prevent nausea and vomiting after your treatment, we encourage you to take your nausea medication as prescribed.   If you develop nausea and vomiting that is not controlled by your nausea medication, call the clinic.   BELOW ARE SYMPTOMS THAT SHOULD BE REPORTED IMMEDIATELY:  *FEVER GREATER THAN 100.5 F  *CHILLS WITH OR WITHOUT FEVER  NAUSEA AND VOMITING THAT IS NOT CONTROLLED WITH YOUR NAUSEA MEDICATION  *UNUSUAL SHORTNESS OF BREATH  *UNUSUAL BRUISING OR BLEEDING  TENDERNESS IN MOUTH AND THROAT WITH OR WITHOUT PRESENCE OF ULCERS  *URINARY PROBLEMS  *BOWEL PROBLEMS  UNUSUAL RASH Items with * indicate a potential emergency and should be followed up as soon as possible.  Feel free to call the clinic you have any questions or concerns. The clinic phone number is [phone].    Paclitaxel injection (Taxol) What is this medicine? PACLITAXEL (PAK li TAX el) is a chemotherapy drug. It targets fast dividing cells, like cancer cells, and causes these cells to die. This medicine is used to treat ovarian cancer, breast cancer, and other cancers. This medicine may be used for other purposes; ask your health care provider or pharmacist if you have questions. COMMON BRAND NAME(S): Onxol, Taxol What should I tell my health care provider before I take this medicine? They need to know if you have any of these conditions: -blood disorders -irregular heartbeat -infection (especially a virus infection such as chickenpox, cold sores, or herpes) -liver disease -previous or ongoing radiation therapy -an unusual or allergic reaction to paclitaxel, alcohol, polyoxyethylated castor oil, other chemotherapy agents, other medicines, foods, dyes, or preservatives -pregnant or trying to get pregnant -breast-feeding How should I use  this medicine? This drug is given as an infusion into a vein. It is administered in a hospital or clinic by a specially trained health care professional. Talk to your pediatrician regarding the use of this medicine in children. Special care may be needed. Overdosage: If you think you have taken too much of this medicine contact a poison control center or emergency room at once. NOTE: This medicine is only for you. Do not share this medicine with others. What if I miss a dose? It is important not to miss your dose. Call your doctor or health care professional if you are unable to keep an appointment. What may interact with this medicine? Do not take this medicine with any of the following medications: -disulfiram -metronidazole This medicine may also interact with the following medications: -cyclosporine -diazepam -ketoconazole -medicines to increase blood counts like filgrastim, pegfilgrastim, sargramostim -other chemotherapy drugs like cisplatin, doxorubicin, epirubicin, etoposide, teniposide, vincristine -quinidine -testosterone -vaccines -verapamil Talk to your doctor or health care professional before taking any of these medicines: -acetaminophen -aspirin -ibuprofen -ketoprofen -naproxen This list may not describe all possible interactions. Give your health care provider a list of all the medicines, herbs, non-prescription drugs, or dietary supplements you use. Also tell them if you smoke, drink alcohol, or use illegal drugs. Some items may interact with your medicine. What should I watch for while using this medicine? Your condition will be monitored carefully while you are receiving this medicine. You will need important blood work done while you are taking this medicine. This drug may make you feel generally unwell. This is not uncommon, as chemotherapy can affect healthy cells as well as cancer cells. Report any side effects. Continue your course of   treatment even though you  feel ill unless your doctor tells you to stop. In some cases, you may be given additional medicines to help with side effects. Follow all directions for their use. Call your doctor or health care professional for advice if you get a fever, chills or sore throat, or other symptoms of a cold or flu. Do not treat yourself. This drug decreases your body's ability to fight infections. Try to avoid being around people who are sick. This medicine may increase your risk to bruise or bleed. Call your doctor or health care professional if you notice any unusual bleeding. Be careful brushing and flossing your teeth or using a toothpick because you may get an infection or bleed more easily. If you have any dental work done, tell your dentist you are receiving this medicine. Avoid taking products that contain aspirin, acetaminophen, ibuprofen, naproxen, or ketoprofen unless instructed by your doctor. These medicines may hide a fever. Do not become pregnant while taking this medicine. Women should inform their doctor if they wish to become pregnant or think they might be pregnant. There is a potential for serious side effects to an unborn child. Talk to your health care professional or pharmacist for more information. Do not breast-feed an infant while taking this medicine. Men are advised not to father a child while receiving this medicine. What side effects may I notice from receiving this medicine? Side effects that you should report to your doctor or health care professional as soon as possible: -allergic reactions like skin rash, itching or hives, swelling of the face, lips, or tongue -low blood counts - This drug may decrease the number of white blood cells, red blood cells and platelets. You may be at increased risk for infections and bleeding. -signs of infection - fever or chills, cough, sore throat, pain or difficulty passing urine -signs of decreased platelets or bleeding - bruising, pinpoint red spots on  the skin, black, tarry stools, nosebleeds -signs of decreased red blood cells - unusually weak or tired, fainting spells, lightheadedness -breathing problems -chest pain -high or low blood pressure -mouth sores -nausea and vomiting -pain, swelling, redness or irritation at the injection site -pain, tingling, numbness in the hands or feet -slow or irregular heartbeat -swelling of the ankle, feet, hands Side effects that usually do not require medical attention (report to your doctor or health care professional if they continue or are bothersome): -bone pain -complete hair loss including hair on your head, underarms, pubic hair, eyebrows, and eyelashes -changes in the color of fingernails -diarrhea -loosening of the fingernails -loss of appetite -muscle or joint pain -red flush to skin -sweating This list may not describe all possible side effects. Call your doctor for medical advice about side effects. You may report side effects to FDA at 1-800-FDA-1088. Where should I keep my medicine? This drug is given in a hospital or clinic and will not be stored at home. NOTE: This sheet is a summary. It may not cover all possible information. If you have questions about this medicine, talk to your doctor, pharmacist, or health care provider.  2015, Elsevier/Gold Standard. (2012-09-27 16:41:21)   Carboplatin injection What is this medicine? CARBOPLATIN (KAR boe pla tin) is a chemotherapy drug. It targets fast dividing cells, like cancer cells, and causes these cells to die. This medicine is used to treat ovarian cancer and many other cancers. This medicine may be used for other purposes; ask your health care provider or pharmacist if you have questions.   COMMON BRAND NAME(S): Paraplatin What should I tell my health care provider before I take this medicine? They need to know if you have any of these conditions: -blood disorders -hearing problems -kidney disease -recent or ongoing radiation  therapy -an unusual or allergic reaction to carboplatin, cisplatin, other chemotherapy, other medicines, foods, dyes, or preservatives -pregnant or trying to get pregnant -breast-feeding How should I use this medicine? This drug is usually given as an infusion into a vein. It is administered in a hospital or clinic by a specially trained health care professional. Talk to your pediatrician regarding the use of this medicine in children. Special care may be needed. Overdosage: If you think you have taken too much of this medicine contact a poison control center or emergency room at once. NOTE: This medicine is only for you. Do not share this medicine with others. What if I miss a dose? It is important not to miss a dose. Call your doctor or health care professional if you are unable to keep an appointment. What may interact with this medicine? -medicines for seizures -medicines to increase blood counts like filgrastim, pegfilgrastim, sargramostim -some antibiotics like amikacin, gentamicin, neomycin, streptomycin, tobramycin -vaccines Talk to your doctor or health care professional before taking any of these medicines: -acetaminophen -aspirin -ibuprofen -ketoprofen -naproxen This list may not describe all possible interactions. Give your health care provider a list of all the medicines, herbs, non-prescription drugs, or dietary supplements you use. Also tell them if you smoke, drink alcohol, or use illegal drugs. Some items may interact with your medicine. What should I watch for while using this medicine? Your condition will be monitored carefully while you are receiving this medicine. You will need important blood work done while you are taking this medicine. This drug may make you feel generally unwell. This is not uncommon, as chemotherapy can affect healthy cells as well as cancer cells. Report any side effects. Continue your course of treatment even though you feel ill unless your doctor  tells you to stop. In some cases, you may be given additional medicines to help with side effects. Follow all directions for their use. Call your doctor or health care professional for advice if you get a fever, chills or sore throat, or other symptoms of a cold or flu. Do not treat yourself. This drug decreases your body's ability to fight infections. Try to avoid being around people who are sick. This medicine may increase your risk to bruise or bleed. Call your doctor or health care professional if you notice any unusual bleeding. Be careful brushing and flossing your teeth or using a toothpick because you may get an infection or bleed more easily. If you have any dental work done, tell your dentist you are receiving this medicine. Avoid taking products that contain aspirin, acetaminophen, ibuprofen, naproxen, or ketoprofen unless instructed by your doctor. These medicines may hide a fever. Do not become pregnant while taking this medicine. Women should inform their doctor if they wish to become pregnant or think they might be pregnant. There is a potential for serious side effects to an unborn child. Talk to your health care professional or pharmacist for more information. Do not breast-feed an infant while taking this medicine. What side effects may I notice from receiving this medicine? Side effects that you should report to your doctor or health care professional as soon as possible: -allergic reactions like skin rash, itching or hives, swelling of the face, lips, or tongue -signs of infection -   fever or chills, cough, sore throat, pain or difficulty passing urine -signs of decreased platelets or bleeding - bruising, pinpoint red spots on the skin, black, tarry stools, nosebleeds -signs of decreased red blood cells - unusually weak or tired, fainting spells, lightheadedness -breathing problems -changes in hearing -changes in vision -chest pain -high blood pressure -low blood counts - This  drug may decrease the number of white blood cells, red blood cells and platelets. You may be at increased risk for infections and bleeding. -nausea and vomiting -pain, swelling, redness or irritation at the injection site -pain, tingling, numbness in the hands or feet -problems with balance, talking, walking -trouble passing urine or change in the amount of urine Side effects that usually do not require medical attention (report to your doctor or health care professional if they continue or are bothersome): -hair loss -loss of appetite -metallic taste in the mouth or changes in taste This list may not describe all possible side effects. Call your doctor for medical advice about side effects. You may report side effects to FDA at 1-800-FDA-1088. Where should I keep my medicine? This drug is given in a hospital or clinic and will not be stored at home. NOTE: This sheet is a summary. It may not cover all possible information. If you have questions about this medicine, talk to your doctor, pharmacist, or health care provider.  2015, Elsevier/Gold Standard. (2007-11-09 14:38:05)   

## 2014-03-01 ENCOUNTER — Telehealth: Payer: Self-pay | Admitting: *Deleted

## 2014-03-01 NOTE — Telephone Encounter (Signed)
Called Joanne Copeland for chemotherapy F/U.  Patient is doing well.  Reports feeling nauseated upon arrival home last evening "because I was empty".  I ate after I took a nausea pill and felt better.  Denies vomiting.  Denies any new side effects or symptoms.  Bowel and bladder is functioning well.  Eating and drinking well and I instructed to drink 64 oz minimum daily or at least the day before, of and after treatment.  Denies questions at this time and encouraged to call if needed.  Reviewed how to call after hours in the case of an emergency.

## 2014-03-01 NOTE — Telephone Encounter (Signed)
Message copied by Cherylynn Ridges on Wed Mar 01, 2014  2:47 PM ------      Message from: Perlie Gold      Created: Tue Feb 28, 2014 12:23 PM      Regarding: Chemo Follow up call      Contact: [phone]       First time Taxol/Carboplatin. Dr. Marko Plume patient. Please call.             Thanks,            Barnetta Chapel, RN ------

## 2014-03-02 ENCOUNTER — Other Ambulatory Visit: Payer: Self-pay | Admitting: Oncology

## 2014-03-03 ENCOUNTER — Other Ambulatory Visit: Payer: Self-pay

## 2014-03-03 ENCOUNTER — Encounter: Payer: Self-pay | Admitting: Oncology

## 2014-03-03 ENCOUNTER — Other Ambulatory Visit (HOSPITAL_BASED_OUTPATIENT_CLINIC_OR_DEPARTMENT_OTHER): Payer: Medicare Other

## 2014-03-03 ENCOUNTER — Ambulatory Visit (HOSPITAL_BASED_OUTPATIENT_CLINIC_OR_DEPARTMENT_OTHER): Payer: Medicare Other | Admitting: Oncology

## 2014-03-03 VITALS — BP 108/53 | HR 98 | Temp 96.0°F | Resp 18 | Ht 64.0 in | Wt 152.0 lb

## 2014-03-03 DIAGNOSIS — C5700 Malignant neoplasm of unspecified fallopian tube: Secondary | ICD-10-CM

## 2014-03-03 DIAGNOSIS — I10 Essential (primary) hypertension: Secondary | ICD-10-CM

## 2014-03-03 DIAGNOSIS — D126 Benign neoplasm of colon, unspecified: Secondary | ICD-10-CM

## 2014-03-03 DIAGNOSIS — C5702 Malignant neoplasm of left fallopian tube: Secondary | ICD-10-CM

## 2014-03-03 LAB — CBC WITH DIFFERENTIAL/PLATELET
BASO%: 0.4 % (ref 0.0–2.0)
Basophils Absolute: 0 10*3/uL (ref 0.0–0.1)
EOS%: 2.5 % (ref 0.0–7.0)
Eosinophils Absolute: 0.2 10*3/uL (ref 0.0–0.5)
HEMATOCRIT: 45.2 % (ref 34.8–46.6)
HGB: 14.6 g/dL (ref 11.6–15.9)
LYMPH%: 16.4 % (ref 14.0–49.7)
MCH: 29.6 pg (ref 25.1–34.0)
MCHC: 32.2 g/dL (ref 31.5–36.0)
MCV: 91.7 fL (ref 79.5–101.0)
MONO#: 0.2 10*3/uL (ref 0.1–0.9)
MONO%: 2.6 % (ref 0.0–14.0)
NEUT#: 6.4 10*3/uL (ref 1.5–6.5)
NEUT%: 78.1 % — AB (ref 38.4–76.8)
PLATELETS: 243 10*3/uL (ref 145–400)
RBC: 4.92 10*6/uL (ref 3.70–5.45)
RDW: 15.9 % — ABNORMAL HIGH (ref 11.2–14.5)
WBC: 8.2 10*3/uL (ref 3.9–10.3)
lymph#: 1.3 10*3/uL (ref 0.9–3.3)

## 2014-03-03 LAB — COMPREHENSIVE METABOLIC PANEL (CC13)
ALBUMIN: 3.4 g/dL — AB (ref 3.5–5.0)
ALT: 16 U/L (ref 0–55)
AST: 16 U/L (ref 5–34)
Alkaline Phosphatase: 43 U/L (ref 40–150)
Anion Gap: 9 mEq/L (ref 3–11)
BILIRUBIN TOTAL: 1.24 mg/dL — AB (ref 0.20–1.20)
BUN: 25.5 mg/dL (ref 7.0–26.0)
CO2: 28 mEq/L (ref 22–29)
Calcium: 9.5 mg/dL (ref 8.4–10.4)
Chloride: 100 mEq/L (ref 98–109)
Creatinine: 1.3 mg/dL — ABNORMAL HIGH (ref 0.6–1.1)
Glucose: 115 mg/dl (ref 70–140)
Potassium: 4.2 mEq/L (ref 3.5–5.1)
SODIUM: 137 meq/L (ref 136–145)
Total Protein: 6.5 g/dL (ref 6.4–8.3)

## 2014-03-03 LAB — MAGNESIUM (CC13): MAGNESIUM: 2.1 mg/dL (ref 1.5–2.5)

## 2014-03-03 MED ORDER — DEXAMETHASONE 4 MG PO TABS: ORAL_TABLET | ORAL | Status: DC

## 2014-03-03 NOTE — Progress Notes (Signed)
OFFICE PROGRESS NOTE   03/03/2014   Physicians:Brewster, Abigail Butts, MD  CC:(Megan Indermaur, gyn onc [address] [place]), (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 She took 2 extra doses of steroids in error 2 days prior to chemo, then had elevated blood pressure during treatment which required brief delay, but otherwise did well with chemo administration. She had vomiting abruptly after supper on day 4 and abruptly after breakfast on day 5, none since then using antiemetics regularly. Peripheral IV access was adequate, no peripheral neuropathy symptoms, bowels ok.  She has reestablished with Dr [name] for PCP, antihypertensive changed this week and has follow up with him next week.  She does not have PAC.   ONCOLOGIC HISTORY Patient had been in usual good health until acute abdominal pain for which she was seen in ED in [place] on 01-02-14 and admitted to [facility]. Per patient, CT scans were done, tho I do not have those reports; laboratory results also do not accompany (pre op CA125 not known). She was taken to optimal debulking by Dr Milana Kidney on 01-05-14 (hysterectomy, BSO, PPLND,omentectomy, pelvic peritonectomy and appendectomy. 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 No washings reported in this  information. Pathologic stage pT3aN0 (stage IIIA). She was discharged home on 01-11-14. She was doing well from the surgery when seen by Dr James Ivanoff on 01-18-14, and has contiued to recover from that without problems. She was not on home lovenox. Start of treatment has been delayed as patient moved back to [place] after follow up visit with Dr Robley Fries on 01-18-14. She saw Dr Skeet Latch in consultation on 02-09-14, also with recommendation for adjuvant chemotherapy, which began with dose dense taxol carboplatin on 02-28-14.   Review of systems as above, also: No fever, taste ok, no bladder symptoms, no bleeding. No LE swelling. No SOB. No new or different pain. Remainder of 10 point Review of Systems negative.  Objective:  Vital signs in last 24 hours:  BP 108/53  Pulse 98  Temp(Src) 96 F (35.6 C) (Oral)  Resp 18  Ht 5\' 4"  (1.626 m)  Wt 152 lb (68.947 kg)  BMI 26.08 kg/m2 Weight stable Alert, oriented and appropriate. Ambulatory without difficulty, looks comfortable.  No alopecia  HEENT:PERRL, sclerae not icteric. Oral mucosa moist without lesions, posterior pharynx clear.  Neck supple. No JVD.  Lymphatics:no cervical,suraclavicular, axillary or inguinal adenopathy Resp: clear to auscultation bilaterally and normal percussion bilaterally Cardio: regular rate and rhythm. No gallop. GI: soft, nontender, not distended, no mass or organomegaly. Normally active bowel sounds. Surgical incision not remarkable. Musculoskeletal/ Extremities: without pitting edema, cords, tenderness Neuro: no peripheral neuropathy. Otherwise nonfocal. PSYCH normal mood and affect Skin without rash, ecchymosis, petechiae   Lab Results:  Results for orders placed in visit on 03/03/14  CBC WITH DIFFERENTIAL      Result Value Ref Range   WBC 8.2  3.9 - 10.3 10e3/uL   NEUT# 6.4  1.5 - 6.5 10e3/uL   HGB 14.6  11.6 - 15.9 g/dL   HCT 45.2  34.8 - 46.6 %   Platelets 243  145 - 400 10e3/uL   MCV 91.7  79.5 -  101.0 fL   MCH 29.6  25.1 - 34.0 pg   MCHC 32.2  31.5 - 36.0 g/dL   RBC 4.92  3.70 - 5.45 10e6/uL   RDW 15.9 (*) 11.2 - 14.5 %   lymph# 1.3  0.9 - 3.3 10e3/uL   MONO# 0.2  0.1 - 0.9 10e3/uL   Eosinophils Absolute 0.2  0.0 - 0.5 10e3/uL   Basophils Absolute 0.0  0.0 - 0.1 10e3/uL   NEUT% 78.1 (*) 38.4 - 76.8 %   LYMPH% 16.4  14.0 - 49.7 %   MONO% 2.6  0.0 - 14.0 %   EOS% 2.5  0.0 - 7.0 %   BASO% 0.4  0.0 - 2.0 %  COMPREHENSIVE METABOLIC PANEL (OK59)      Result Value Ref Range   Sodium 137  136 - 145 mEq/L   Potassium 4.2  3.5 - 5.1 mEq/L   Chloride 100  98 - 109 mEq/L   CO2 28  22 - 29 mEq/L   Glucose 115  70 - 140 mg/dl   BUN 25.5  7.0 - 26.0 mg/dL   Creatinine 1.3 (*) 0.6 - 1.1 mg/dL   Total Bilirubin 1.24 (*) 0.20 - 1.20 mg/dL   Alkaline Phosphatase 43  40 - 150 U/L   AST 16  5 - 34 U/L   ALT 16  0 - 55 U/L   Total Protein 6.5  6.4 - 8.3 g/dL   Albumin 3.4 (*) 3.5 - 5.0 g/dL   Calcium 9.5  8.4 - 10.4 mg/dL   Anion Gap 9  3 - 11 mEq/L  MAGNESIUM (CC13)      Result Value Ref Range   Magnesium 2.1  1.5 - 2.5 mg/dl     Studies/Results:  No results found. She did not bring outside mammogram report this visit.  Medications: I have reviewed the patient's current medications. Will decrease premed decadron for taxol to 20 mg 12 hrs prior (no 6 hour prior dose). Have suggested she try back to prn on antiemetics for next 2 days, as she may not need them regularly for full week between each treatment.   DISCUSSION: Patient is pleased with how she has tolerated start of chemotherapy and in agreement with continuing as planned.  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 Decrease decadron premed as above. Day 8 cycle 1 on 7-20 and day 15 cycle 1 on 7-27 as long as ANC >= 1.5 and plt >=100k, then day 1 cycle 2 on 03-21-14.  2.hypertension: appreciate adjustment in  medications by Dr Mancel Bale 3.colon polyps: due colonoscopy fall 2015  4.thickening left breast on PE with history of some finding on that side previously: will review recent mammogram information when that is received  5.elevated cholesterol  6.social: has just moved back to [place] area after 17 years away. Husband with some dementia.    All questions answered. Chemo orders confirmed. Patient to pick up further schedule when she is back for treatment on 03-06-14.    Meghann Landing P, MD   03/03/2014, 1:05 PM

## 2014-03-04 ENCOUNTER — Other Ambulatory Visit: Payer: Self-pay | Admitting: Oncology

## 2014-03-04 DIAGNOSIS — C5700 Malignant neoplasm of unspecified fallopian tube: Secondary | ICD-10-CM

## 2014-03-05 ENCOUNTER — Telehealth: Payer: Self-pay | Admitting: Oncology

## 2014-03-05 NOTE — Telephone Encounter (Signed)
s.w. pt and advised on appts.Marland KitchenMarland Kitchenpt will pick up new sched tomorrow

## 2014-03-06 ENCOUNTER — Other Ambulatory Visit: Payer: PRIVATE HEALTH INSURANCE

## 2014-03-06 ENCOUNTER — Other Ambulatory Visit (HOSPITAL_BASED_OUTPATIENT_CLINIC_OR_DEPARTMENT_OTHER): Payer: Medicare Other

## 2014-03-06 ENCOUNTER — Ambulatory Visit (HOSPITAL_BASED_OUTPATIENT_CLINIC_OR_DEPARTMENT_OTHER): Payer: Medicare Other

## 2014-03-06 VITALS — BP 136/75 | HR 88 | Temp 98.2°F | Resp 18

## 2014-03-06 DIAGNOSIS — Z5111 Encounter for antineoplastic chemotherapy: Secondary | ICD-10-CM

## 2014-03-06 DIAGNOSIS — C5700 Malignant neoplasm of unspecified fallopian tube: Secondary | ICD-10-CM

## 2014-03-06 DIAGNOSIS — C5702 Malignant neoplasm of left fallopian tube: Secondary | ICD-10-CM

## 2014-03-06 LAB — COMPREHENSIVE METABOLIC PANEL (CC13)
ALBUMIN: 3.5 g/dL (ref 3.5–5.0)
ALT: 15 U/L (ref 0–55)
ANION GAP: 12 meq/L — AB (ref 3–11)
AST: 15 U/L (ref 5–34)
Alkaline Phosphatase: 45 U/L (ref 40–150)
BUN: 29.2 mg/dL — ABNORMAL HIGH (ref 7.0–26.0)
CALCIUM: 9.5 mg/dL (ref 8.4–10.4)
CHLORIDE: 102 meq/L (ref 98–109)
CO2: 23 meq/L (ref 22–29)
Creatinine: 1.2 mg/dL — ABNORMAL HIGH (ref 0.6–1.1)
Glucose: 276 mg/dl — ABNORMAL HIGH (ref 70–140)
POTASSIUM: 4.2 meq/L (ref 3.5–5.1)
SODIUM: 137 meq/L (ref 136–145)
TOTAL PROTEIN: 6.9 g/dL (ref 6.4–8.3)
Total Bilirubin: 0.6 mg/dL (ref 0.20–1.20)

## 2014-03-06 LAB — CBC WITH DIFFERENTIAL/PLATELET
BASO%: 0.3 % (ref 0.0–2.0)
Basophils Absolute: 0 10*3/uL (ref 0.0–0.1)
EOS ABS: 0 10*3/uL (ref 0.0–0.5)
EOS%: 0 % (ref 0.0–7.0)
HCT: 41.4 % (ref 34.8–46.6)
HGB: 13.6 g/dL (ref 11.6–15.9)
LYMPH#: 0.3 10*3/uL — AB (ref 0.9–3.3)
LYMPH%: 7.2 % — ABNORMAL LOW (ref 14.0–49.7)
MCH: 29.5 pg (ref 25.1–34.0)
MCHC: 32.9 g/dL (ref 31.5–36.0)
MCV: 89.8 fL (ref 79.5–101.0)
MONO#: 0 10*3/uL — ABNORMAL LOW (ref 0.1–0.9)
MONO%: 0.3 % (ref 0.0–14.0)
NEUT%: 92.2 % — ABNORMAL HIGH (ref 38.4–76.8)
NEUTROS ABS: 3.6 10*3/uL (ref 1.5–6.5)
NRBC: 0 % (ref 0–0)
Platelets: 218 10*3/uL (ref 145–400)
RBC: 4.61 10*6/uL (ref 3.70–5.45)
RDW: 14.8 % — AB (ref 11.2–14.5)
WBC: 3.9 10*3/uL (ref 3.9–10.3)

## 2014-03-06 MED ORDER — DIPHENHYDRAMINE HCL 50 MG/ML IJ SOLN
INTRAMUSCULAR | Status: AC
  Filled 2014-03-06: qty 1

## 2014-03-06 MED ORDER — ONDANSETRON 8 MG/50ML IVPB (CHCC)
8.0000 mg | Freq: Once | INTRAVENOUS | Status: AC
  Administered 2014-03-06: 8 mg via INTRAVENOUS

## 2014-03-06 MED ORDER — SODIUM CHLORIDE 0.9 % IV SOLN
Freq: Once | INTRAVENOUS | Status: AC
  Administered 2014-03-06: 10:00:00 via INTRAVENOUS

## 2014-03-06 MED ORDER — DEXAMETHASONE SODIUM PHOSPHATE 20 MG/5ML IJ SOLN
20.0000 mg | Freq: Once | INTRAMUSCULAR | Status: AC
  Administered 2014-03-06: 20 mg via INTRAVENOUS

## 2014-03-06 MED ORDER — FAMOTIDINE IN NACL 20-0.9 MG/50ML-% IV SOLN
INTRAVENOUS | Status: AC
  Filled 2014-03-06: qty 50

## 2014-03-06 MED ORDER — PACLITAXEL CHEMO INJECTION 300 MG/50ML
80.0000 mg/m2 | Freq: Once | INTRAVENOUS | Status: AC
  Administered 2014-03-06: 138 mg via INTRAVENOUS
  Filled 2014-03-06: qty 23

## 2014-03-06 MED ORDER — DIPHENHYDRAMINE HCL 50 MG/ML IJ SOLN
50.0000 mg | Freq: Once | INTRAMUSCULAR | Status: AC
  Administered 2014-03-06: 50 mg via INTRAVENOUS

## 2014-03-06 MED ORDER — DEXAMETHASONE SODIUM PHOSPHATE 20 MG/5ML IJ SOLN
INTRAMUSCULAR | Status: AC
  Filled 2014-03-06: qty 5

## 2014-03-06 MED ORDER — FAMOTIDINE IN NACL 20-0.9 MG/50ML-% IV SOLN
20.0000 mg | Freq: Once | INTRAVENOUS | Status: AC
  Administered 2014-03-06: 20 mg via INTRAVENOUS

## 2014-03-06 MED ORDER — ONDANSETRON 8 MG/NS 50 ML IVPB
INTRAVENOUS | Status: AC
  Filled 2014-03-06: qty 8

## 2014-03-06 NOTE — Patient Instructions (Signed)
[facility] Discharge Instructions for Patients Receiving Chemotherapy  Today you received the following chemotherapy agents: taxol  To help prevent nausea and vomiting after your treatment, we encourage you to take your nausea medication.  Take it as often as prescribed.     If you develop nausea and vomiting that is not controlled by your nausea medication, call the clinic. If it is after clinic hours your family physician or the after hours number for the clinic or go to the Emergency Department.   BELOW ARE SYMPTOMS THAT SHOULD BE REPORTED IMMEDIATELY:  *FEVER GREATER THAN 100.5 F  *CHILLS WITH OR WITHOUT FEVER  NAUSEA AND VOMITING THAT IS NOT CONTROLLED WITH YOUR NAUSEA MEDICATION  *UNUSUAL SHORTNESS OF BREATH  *UNUSUAL BRUISING OR BLEEDING  TENDERNESS IN MOUTH AND THROAT WITH OR WITHOUT PRESENCE OF ULCERS  *URINARY PROBLEMS  *BOWEL PROBLEMS  UNUSUAL RASH Items with * indicate a potential emergency and should be followed up as soon as possible.  Feel free to call the clinic you have any questions or concerns. The clinic phone number is [phone].   I have been informed and understand all the instructions given to me. I know to contact the clinic, my physician, or go to the Emergency Department if any problems should occur. I do not have any questions at this time, but understand that I may call the clinic during office hours   should I have any questions or need assistance in obtaining follow up care.    __________________________________________  _____________  __________ Signature of Patient or Authorized Representative            Date                   Time    __________________________________________ Nurse's Signature    

## 2014-03-10 ENCOUNTER — Other Ambulatory Visit: Payer: PRIVATE HEALTH INSURANCE

## 2014-03-10 ENCOUNTER — Encounter: Payer: PRIVATE HEALTH INSURANCE | Admitting: Physician Assistant

## 2014-03-13 ENCOUNTER — Ambulatory Visit: Payer: PRIVATE HEALTH INSURANCE

## 2014-03-13 ENCOUNTER — Other Ambulatory Visit: Payer: PRIVATE HEALTH INSURANCE

## 2014-03-13 ENCOUNTER — Other Ambulatory Visit (HOSPITAL_BASED_OUTPATIENT_CLINIC_OR_DEPARTMENT_OTHER): Payer: Medicare Other

## 2014-03-13 DIAGNOSIS — C5700 Malignant neoplasm of unspecified fallopian tube: Secondary | ICD-10-CM

## 2014-03-13 LAB — COMPREHENSIVE METABOLIC PANEL (CC13)
ALBUMIN: 3.5 g/dL (ref 3.5–5.0)
ALK PHOS: 49 U/L (ref 40–150)
ALT: 16 U/L (ref 0–55)
AST: 15 U/L (ref 5–34)
Anion Gap: 11 mEq/L (ref 3–11)
BUN: 19.3 mg/dL (ref 7.0–26.0)
CO2: 23 mEq/L (ref 22–29)
Calcium: 9.5 mg/dL (ref 8.4–10.4)
Chloride: 108 mEq/L (ref 98–109)
Creatinine: 1 mg/dL (ref 0.6–1.1)
Glucose: 278 mg/dl — ABNORMAL HIGH (ref 70–140)
POTASSIUM: 4.1 meq/L (ref 3.5–5.1)
SODIUM: 142 meq/L (ref 136–145)
Total Bilirubin: 0.23 mg/dL (ref 0.20–1.20)
Total Protein: 6.8 g/dL (ref 6.4–8.3)

## 2014-03-13 LAB — CBC WITH DIFFERENTIAL/PLATELET
BASO%: 0.3 % (ref 0.0–2.0)
Basophils Absolute: 0 10*3/uL (ref 0.0–0.1)
EOS ABS: 0 10*3/uL (ref 0.0–0.5)
EOS%: 0 % (ref 0.0–7.0)
HCT: 38.9 % (ref 34.8–46.6)
HGB: 12.6 g/dL (ref 11.6–15.9)
LYMPH%: 32.3 % (ref 14.0–49.7)
MCH: 29.6 pg (ref 25.1–34.0)
MCHC: 32.4 g/dL (ref 31.5–36.0)
MCV: 91.2 fL (ref 79.5–101.0)
MONO#: 0 10*3/uL — ABNORMAL LOW (ref 0.1–0.9)
MONO%: 2.6 % (ref 0.0–14.0)
NEUT%: 64.8 % (ref 38.4–76.8)
NEUTROS ABS: 0.9 10*3/uL — AB (ref 1.5–6.5)
Platelets: 221 10*3/uL (ref 145–400)
RBC: 4.27 10*6/uL (ref 3.70–5.45)
RDW: 15.8 % — ABNORMAL HIGH (ref 11.2–14.5)
WBC: 1.4 10*3/uL — ABNORMAL LOW (ref 3.9–10.3)
lymph#: 0.5 10*3/uL — ABNORMAL LOW (ref 0.9–3.3)

## 2014-03-13 NOTE — Progress Notes (Signed)
Spoke with Awilda Metro PA-C regarding treatment next week on 03-20-14 as being day 15 of Cycle 1 or Day 1 of Cycle 2.   Adrena said that Cycle 1 day 15 will not be given and on 03-20-14 Ms. Pharo will receive Cycle 2 day 1 if lab parameters met.  Notified Lew in [place] of this information.

## 2014-03-13 NOTE — Progress Notes (Signed)
ANC: 0.9. Awilda Metro PA notified. Treatment held. Patient notified.

## 2014-03-20 ENCOUNTER — Ambulatory Visit (HOSPITAL_BASED_OUTPATIENT_CLINIC_OR_DEPARTMENT_OTHER): Payer: Medicare Other

## 2014-03-20 ENCOUNTER — Other Ambulatory Visit: Payer: Self-pay | Admitting: Oncology

## 2014-03-20 ENCOUNTER — Other Ambulatory Visit (HOSPITAL_BASED_OUTPATIENT_CLINIC_OR_DEPARTMENT_OTHER): Payer: Medicare Other

## 2014-03-20 VITALS — BP 149/60 | HR 95 | Temp 98.0°F | Resp 20

## 2014-03-20 DIAGNOSIS — Z5111 Encounter for antineoplastic chemotherapy: Secondary | ICD-10-CM

## 2014-03-20 DIAGNOSIS — C5700 Malignant neoplasm of unspecified fallopian tube: Secondary | ICD-10-CM

## 2014-03-20 DIAGNOSIS — C5702 Malignant neoplasm of left fallopian tube: Secondary | ICD-10-CM

## 2014-03-20 LAB — CBC WITH DIFFERENTIAL/PLATELET
BASO%: 0.2 % (ref 0.0–2.0)
Basophils Absolute: 0 10*3/uL (ref 0.0–0.1)
EOS%: 0 % (ref 0.0–7.0)
Eosinophils Absolute: 0 10*3/uL (ref 0.0–0.5)
HCT: 41.3 % (ref 34.8–46.6)
HGB: 13.5 g/dL (ref 11.6–15.9)
LYMPH%: 11.9 % — AB (ref 14.0–49.7)
MCH: 29.9 pg (ref 25.1–34.0)
MCHC: 32.6 g/dL (ref 31.5–36.0)
MCV: 91.9 fL (ref 79.5–101.0)
MONO#: 0 10*3/uL — ABNORMAL LOW (ref 0.1–0.9)
MONO%: 0.9 % (ref 0.0–14.0)
NEUT#: 4.6 10*3/uL (ref 1.5–6.5)
NEUT%: 87 % — ABNORMAL HIGH (ref 38.4–76.8)
PLATELETS: 190 10*3/uL (ref 145–400)
RBC: 4.49 10*6/uL (ref 3.70–5.45)
RDW: 16.3 % — ABNORMAL HIGH (ref 11.2–14.5)
WBC: 5.3 10*3/uL (ref 3.9–10.3)
lymph#: 0.6 10*3/uL — ABNORMAL LOW (ref 0.9–3.3)

## 2014-03-20 LAB — COMPREHENSIVE METABOLIC PANEL (CC13)
ALBUMIN: 3.5 g/dL (ref 3.5–5.0)
ALK PHOS: 55 U/L (ref 40–150)
ALT: 14 U/L (ref 0–55)
ANION GAP: 13 meq/L — AB (ref 3–11)
AST: 17 U/L (ref 5–34)
BUN: 20.6 mg/dL (ref 7.0–26.0)
CALCIUM: 9.7 mg/dL (ref 8.4–10.4)
CO2: 21 mEq/L — ABNORMAL LOW (ref 22–29)
Chloride: 105 mEq/L (ref 98–109)
Creatinine: 1.1 mg/dL (ref 0.6–1.1)
Glucose: 286 mg/dl — ABNORMAL HIGH (ref 70–140)
Potassium: 4 mEq/L (ref 3.5–5.1)
SODIUM: 139 meq/L (ref 136–145)
TOTAL PROTEIN: 6.9 g/dL (ref 6.4–8.3)
Total Bilirubin: 0.31 mg/dL (ref 0.20–1.20)

## 2014-03-20 MED ORDER — DEXAMETHASONE SODIUM PHOSPHATE 20 MG/5ML IJ SOLN
20.0000 mg | Freq: Once | INTRAMUSCULAR | Status: AC
  Administered 2014-03-20: 20 mg via INTRAVENOUS

## 2014-03-20 MED ORDER — ONDANSETRON 8 MG/NS 50 ML IVPB
INTRAVENOUS | Status: AC
  Filled 2014-03-20: qty 8

## 2014-03-20 MED ORDER — DIPHENHYDRAMINE HCL 50 MG/ML IJ SOLN
50.0000 mg | Freq: Once | INTRAMUSCULAR | Status: AC
  Administered 2014-03-20: 50 mg via INTRAVENOUS

## 2014-03-20 MED ORDER — PACLITAXEL CHEMO INJECTION 300 MG/50ML
80.0000 mg/m2 | Freq: Once | INTRAVENOUS | Status: AC
  Administered 2014-03-20: 138 mg via INTRAVENOUS
  Filled 2014-03-20: qty 23

## 2014-03-20 MED ORDER — DEXAMETHASONE SODIUM PHOSPHATE 20 MG/5ML IJ SOLN
INTRAMUSCULAR | Status: AC
  Filled 2014-03-20: qty 5

## 2014-03-20 MED ORDER — SODIUM CHLORIDE 0.9 % IV SOLN
Freq: Once | INTRAVENOUS | Status: AC
  Administered 2014-03-20: 11:00:00 via INTRAVENOUS

## 2014-03-20 MED ORDER — DIPHENHYDRAMINE HCL 50 MG/ML IJ SOLN
INTRAMUSCULAR | Status: AC
  Filled 2014-03-20: qty 1

## 2014-03-20 MED ORDER — ONDANSETRON 8 MG/50ML IVPB (CHCC)
8.0000 mg | Freq: Once | INTRAVENOUS | Status: AC
  Administered 2014-03-20: 8 mg via INTRAVENOUS

## 2014-03-20 MED ORDER — FAMOTIDINE IN NACL 20-0.9 MG/50ML-% IV SOLN
20.0000 mg | Freq: Once | INTRAVENOUS | Status: AC
  Administered 2014-03-20: 20 mg via INTRAVENOUS

## 2014-03-20 MED ORDER — FAMOTIDINE IN NACL 20-0.9 MG/50ML-% IV SOLN
INTRAVENOUS | Status: AC
  Filled 2014-03-20: qty 50

## 2014-03-20 NOTE — Patient Instructions (Signed)
[facility] Discharge Instructions for Patients Receiving Chemotherapy  Today you received the following chemotherapy agents:  Taxol  To help prevent nausea and vomiting after your treatment, we encourage you to take your nausea medication as ordered per MD.   If you develop nausea and vomiting that is not controlled by your nausea medication, call the clinic.   BELOW ARE SYMPTOMS THAT SHOULD BE REPORTED IMMEDIATELY:  *FEVER GREATER THAN 100.5 F  *CHILLS WITH OR WITHOUT FEVER  NAUSEA AND VOMITING THAT IS NOT CONTROLLED WITH YOUR NAUSEA MEDICATION  *UNUSUAL SHORTNESS OF BREATH  *UNUSUAL BRUISING OR BLEEDING  TENDERNESS IN MOUTH AND THROAT WITH OR WITHOUT PRESENCE OF ULCERS  *URINARY PROBLEMS  *BOWEL PROBLEMS  UNUSUAL RASH Items with * indicate a potential emergency and should be followed up as soon as possible.  Feel free to call the clinic you have any questions or concerns. The clinic phone number is [phone].    

## 2014-03-21 ENCOUNTER — Other Ambulatory Visit: Payer: Self-pay | Admitting: Oncology

## 2014-03-22 ENCOUNTER — Ambulatory Visit: Payer: PRIVATE HEALTH INSURANCE | Admitting: Oncology

## 2014-03-27 ENCOUNTER — Other Ambulatory Visit (HOSPITAL_BASED_OUTPATIENT_CLINIC_OR_DEPARTMENT_OTHER): Payer: Medicare Other

## 2014-03-27 ENCOUNTER — Ambulatory Visit (HOSPITAL_BASED_OUTPATIENT_CLINIC_OR_DEPARTMENT_OTHER): Payer: Medicare Other | Admitting: Oncology

## 2014-03-27 ENCOUNTER — Encounter: Payer: Self-pay | Admitting: Oncology

## 2014-03-27 ENCOUNTER — Ambulatory Visit (HOSPITAL_BASED_OUTPATIENT_CLINIC_OR_DEPARTMENT_OTHER): Payer: Medicare Other

## 2014-03-27 VITALS — BP 149/51 | HR 94 | Temp 98.2°F | Resp 18 | Ht 64.0 in | Wt 152.9 lb

## 2014-03-27 VITALS — BP 140/63 | HR 82

## 2014-03-27 DIAGNOSIS — C5700 Malignant neoplasm of unspecified fallopian tube: Secondary | ICD-10-CM

## 2014-03-27 DIAGNOSIS — Z5111 Encounter for antineoplastic chemotherapy: Secondary | ICD-10-CM

## 2014-03-27 DIAGNOSIS — T380X5A Adverse effect of glucocorticoids and synthetic analogues, initial encounter: Secondary | ICD-10-CM | POA: Insufficient documentation

## 2014-03-27 DIAGNOSIS — C5702 Malignant neoplasm of left fallopian tube: Secondary | ICD-10-CM

## 2014-03-27 DIAGNOSIS — R7309 Other abnormal glucose: Secondary | ICD-10-CM

## 2014-03-27 DIAGNOSIS — R739 Hyperglycemia, unspecified: Secondary | ICD-10-CM | POA: Insufficient documentation

## 2014-03-27 LAB — CBC WITH DIFFERENTIAL/PLATELET
BASO%: 0.5 % (ref 0.0–2.0)
Basophils Absolute: 0 10*3/uL (ref 0.0–0.1)
EOS ABS: 0 10*3/uL (ref 0.0–0.5)
EOS%: 0 % (ref 0.0–7.0)
HCT: 40.9 % (ref 34.8–46.6)
HGB: 13.5 g/dL (ref 11.6–15.9)
LYMPH%: 7.1 % — AB (ref 14.0–49.7)
MCH: 30.3 pg (ref 25.1–34.0)
MCHC: 33 g/dL (ref 31.5–36.0)
MCV: 91.6 fL (ref 79.5–101.0)
MONO#: 0 10*3/uL — AB (ref 0.1–0.9)
MONO%: 0.8 % (ref 0.0–14.0)
NEUT%: 91.6 % — AB (ref 38.4–76.8)
NEUTROS ABS: 5.3 10*3/uL (ref 1.5–6.5)
PLATELETS: 327 10*3/uL (ref 145–400)
RBC: 4.46 10*6/uL (ref 3.70–5.45)
RDW: 16.9 % — ABNORMAL HIGH (ref 11.2–14.5)
WBC: 5.8 10*3/uL (ref 3.9–10.3)
lymph#: 0.4 10*3/uL — ABNORMAL LOW (ref 0.9–3.3)

## 2014-03-27 LAB — COMPREHENSIVE METABOLIC PANEL
ALT: 12 U/L (ref 0–35)
AST: 18 U/L (ref 0–37)
Albumin: 3.8 g/dL (ref 3.5–5.2)
Alkaline Phosphatase: 65 U/L (ref 39–117)
BILIRUBIN TOTAL: 0.2 mg/dL — AB (ref 0.2–1.2)
BUN: 21 mg/dL (ref 6–23)
CO2: 21 meq/L (ref 19–32)
Calcium: 10 mg/dL (ref 8.4–10.5)
Chloride: 97 mEq/L (ref 96–112)
Creatinine, Ser: 0.88 mg/dL (ref 0.50–1.10)
Glucose, Bld: 321 mg/dL — ABNORMAL HIGH (ref 70–99)
Potassium: 4.3 mEq/L (ref 3.5–5.3)
SODIUM: 139 meq/L (ref 135–145)
TOTAL PROTEIN: 7.3 g/dL (ref 6.0–8.3)

## 2014-03-27 MED ORDER — INSULIN REGULAR HUMAN 100 UNIT/ML IJ SOLN
2.0000 [IU] | Freq: Once | INTRAMUSCULAR | Status: AC
  Administered 2014-03-27: 2 [IU] via SUBCUTANEOUS
  Filled 2014-03-27: qty 0.02

## 2014-03-27 MED ORDER — ONDANSETRON 16 MG/50ML IVPB (CHCC)
16.0000 mg | Freq: Once | INTRAVENOUS | Status: AC
  Administered 2014-03-27: 16 mg via INTRAVENOUS

## 2014-03-27 MED ORDER — FAMOTIDINE IN NACL 20-0.9 MG/50ML-% IV SOLN
INTRAVENOUS | Status: AC
  Filled 2014-03-27: qty 50

## 2014-03-27 MED ORDER — DEXAMETHASONE SODIUM PHOSPHATE 20 MG/5ML IJ SOLN
20.0000 mg | Freq: Once | INTRAMUSCULAR | Status: AC
  Administered 2014-03-27: 20 mg via INTRAVENOUS

## 2014-03-27 MED ORDER — DIPHENHYDRAMINE HCL 50 MG/ML IJ SOLN
25.0000 mg | Freq: Once | INTRAMUSCULAR | Status: AC
  Administered 2014-03-27: 25 mg via INTRAVENOUS

## 2014-03-27 MED ORDER — DEXAMETHASONE SODIUM PHOSPHATE 20 MG/5ML IJ SOLN
INTRAMUSCULAR | Status: AC
  Filled 2014-03-27: qty 5

## 2014-03-27 MED ORDER — DEXTROSE 5 % IV SOLN
80.0000 mg/m2 | Freq: Once | INTRAVENOUS | Status: AC
  Administered 2014-03-27: 138 mg via INTRAVENOUS
  Filled 2014-03-27: qty 23

## 2014-03-27 MED ORDER — ONDANSETRON 16 MG/50ML IVPB (CHCC)
INTRAVENOUS | Status: AC
  Filled 2014-03-27: qty 16

## 2014-03-27 MED ORDER — SENNA-DOCUSATE SODIUM 8.6-50 MG PO TABS: 1.0000 | ORAL_TABLET | Freq: Every day | ORAL | Status: DC

## 2014-03-27 MED ORDER — DIPHENHYDRAMINE HCL 50 MG/ML IJ SOLN
INTRAMUSCULAR | Status: AC
  Filled 2014-03-27: qty 1

## 2014-03-27 MED ORDER — SODIUM CHLORIDE 0.9 % IV SOLN
Freq: Once | INTRAVENOUS | Status: AC
  Administered 2014-03-27: 10:00:00 via INTRAVENOUS

## 2014-03-27 MED ORDER — FAMOTIDINE IN NACL 20-0.9 MG/50ML-% IV SOLN
20.0000 mg | Freq: Once | INTRAVENOUS | Status: AC
  Administered 2014-03-27: 20 mg via INTRAVENOUS

## 2014-03-27 MED ORDER — CARBOPLATIN CHEMO INJECTION 450 MG/45ML
341.0000 mg | Freq: Once | INTRAVENOUS | Status: AC
  Administered 2014-03-27: 340 mg via INTRAVENOUS
  Filled 2014-03-27: qty 34

## 2014-03-27 NOTE — Progress Notes (Signed)
OFFICE PROGRESS NOTE   03/27/2014   Physicians:Brewster, Abigail Butts, MD, [name]; (PCP))   INTERVAL HISTORY:  Patient is seen, alone for visit, in continuing attention to adjuvant chemotherapy with dose dense carbo taxol for IIIA left fallopian tube carcinoma. She is due day 1 cycle 2 today. Patient has done well with chemotherapy thus far other than neutropenia on day 15 cycle 1 such that treatment was delayed a week, with "day 15" cycle 1 given on 03-20-14. Due to the neutropenia, carboplatin dose has been reduced slightly today and we will use granix 300 mg on days 2 and 9. Only problems other than the neutropenia have been agitation with benadryl 50 mg premed for taxol, difficulty sleeping after premed decadron (20 mg 12 hrs prior to taxol) and multiple attempts needed to establish IV access for treatments. As she has had no reactions to taxol, will decrease premed benadryl to 25 mg. She is willing to have PAC placed by IR, which we have discussed now. She comes to [facility] on Sundays and returns to [place] on Wednesdays, so will try to coordinate [facility] placement with her schedule. She prefers appointments no earlier than 0900 due to traffic getting to office.  Otherwise, she has had no nausea, taste is fine and appetite good. Bowels are moving regularly with senokot S. She denies peripheral neuropathy symptoms. She is energetic after steroids and cleans house, but is not overly fatigued after that exertion.    ONCOLOGIC HISTORY Patient had been in usual good health until acute abdominal pain for which she was seen in ED in [place] on 01-02-14 and admitted to [facility]. Per patient, CT scans were done, tho I do not have those reports; laboratory results also do not accompany (pre op CA125 not known). She was taken to optimal debulking by Dr Milana Kidney on 01-05-14 (hysterectomy, BSO, PPLND,omentectomy, pelvic peritonectomy and appendectomy. 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 No washings reported in this information. Pathologic stage [id number] (stage IIIA). She was discharged home on 01-11-14. She was doing well from the surgery when seen by Dr James Ivanoff on 01-18-14, and has contiued to recover from that without problems. She was not on home lovenox.  Start of treatment has been delayed as patient moved back to [place] after follow up visit with Dr Robley Fries on 01-18-14. She saw Dr Skeet Latch in consultation on 02-09-14, also with recommendation for adjuvant chemotherapy, which began with dose dense taxol carboplatin on 02-28-14. She was neutropenic by day 15 cycle 1, with that treatment delayed x 1 week.  Review of systems as above, also: No fever or symptoms of infection. She has lost hair. She has started gentle exercises for legs, as she had done prior to surgery.  Remainder of 10 point Review of Systems negative.  Objective:  Vital signs in last 24 hours:  BP 149/51  Pulse 94  Temp(Src) 98.2 F (36.8 C) (Oral)  Resp 18  Ht 5\' 4"  (1.626 m)  Wt 152 lb 14.4 oz (69.355 kg)  BMI 26.23 kg/m2  SpO2 98% weight is up 1 lb.  Alert, oriented and  appropriate. Ambulatory without difficulty.  Alopecia  HEENT:PERRL, sclerae not icteric. Oral mucosa moist without lesions, posterior pharynx clear.  Neck supple. No JVD.  Lymphatics:no cervical,suraclavicular adenopathy Resp: clear to auscultation bilaterally and normal percussion bilaterally Cardio: regular rate and rhythm. No gallop. GI: soft,  nontender, not distended, no mass or organomegaly. Normally active bowel sounds. Surgical incision not remarkable. Musculoskeletal/ Extremities: without pitting edema, cords, tenderness Neuro: no peripheral neuropathy. Otherwise nonfocal Skin without rash, ecchymosis, petechiae. No problems at site of most recent IV   Lab Results:  Results for orders placed in visit on 03/27/14  CBC WITH DIFFERENTIAL      Result Value Ref Range   WBC 5.8  3.9 - 10.3 10e3/uL   NEUT# 5.3  1.5 - 6.5 10e3/uL   HGB 13.5  11.6 - 15.9 g/dL   HCT 40.9  34.8 - 46.6 %   Platelets 327  145 - 400 10e3/uL   MCV 91.6  79.5 - 101.0 fL   MCH 30.3  25.1 - 34.0 pg   MCHC 33.0  31.5 - 36.0 g/dL   RBC 4.46  3.70 - 5.45 10e6/uL   RDW 16.9 (*) 11.2 - 14.5 %   lymph# 0.4 (*) 0.9 - 3.3 10e3/uL   MONO# 0.0 (*) 0.1 - 0.9 10e3/uL   Eosinophils Absolute 0.0  0.0 - 0.5 10e3/uL   Basophils Absolute 0.0  0.0 - 0.1 10e3/uL   NEUT% 91.6 (*) 38.4 - 76.8 %   LYMPH% 7.1 (*) 14.0 - 49.7 %   MONO% 0.8  0.0 - 14.0 %   EOS% 0.0  0.0 - 7.0 %   BASO% 0.5  0.0 - 2.0 %   CMET available prior to treatment today has glucose 321, creat good at 0.88  Studies/Results:  No results found.  Medications: I have reviewed the patient's current medications. Benadryl decreased to 25 mg with taxol premeds. Humulin regular insulin 2 units now in addition to full liter of NS IV today. Add granix 300 days 2 and 9.  DISCUSSION: PAC as above, needed prior to treatment 8-17 if possible. She does not think she wants EMLA cream, tho fine to call this later if prefers it. Overall she is very pleased with how she is tolerating treatment thus far.  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 Adjustments with chemo and premeds as noted. Add granix days 2 and 9 2.steroid induced hyperglycemia: additional NS with [place] today and cover with low dose  insulin x 1 now.  3.inadequate peripheral IV access for full course of chemo: nursing aware for today and will request PAC by IR prior to next week's treatment. 4.thickening left breast on PE with history of some finding on that side previously: will review recent mammogram information when that is received  5.elevated cholesterol  6..hypertension: appreciate adjustment in medications by Dr Mancel Bale 7.colon polyps: due colonoscopy fall 2015  8.social: has just moved back to [place] area after 17 years away. Husband with some dementia.  I will see her at least 8-31 or sooner if needed.   Rondale Nies P, MD   03/27/2014, 9:30 AM

## 2014-03-27 NOTE — Progress Notes (Signed)
Ok to treat today per Dr Marko Plume, will give patient insulin and extra IVF.

## 2014-03-27 NOTE — Patient Instructions (Signed)
[facility] Discharge Instructions for Patients Receiving Chemotherapy  Today you received the following chemotherapy agents taxol/carboplatin  To help prevent nausea and vomiting after your treatment, we encourage you to take your nausea medication as directed   If you develop nausea and vomiting that is not controlled by your nausea medication, call the clinic.   BELOW ARE SYMPTOMS THAT SHOULD BE REPORTED IMMEDIATELY:  *FEVER GREATER THAN 100.5 F  *CHILLS WITH OR WITHOUT FEVER  NAUSEA AND VOMITING THAT IS NOT CONTROLLED WITH YOUR NAUSEA MEDICATION  *UNUSUAL SHORTNESS OF BREATH  *UNUSUAL BRUISING OR BLEEDING  TENDERNESS IN MOUTH AND THROAT WITH OR WITHOUT PRESENCE OF ULCERS  *URINARY PROBLEMS  *BOWEL PROBLEMS  UNUSUAL RASH Items with * indicate a potential emergency and should be followed up as soon as possible.  Feel free to call the clinic you have any questions or concerns. The clinic phone number is [phone].  

## 2014-03-28 ENCOUNTER — Other Ambulatory Visit: Payer: Self-pay | Admitting: Oncology

## 2014-03-29 ENCOUNTER — Telehealth: Payer: Self-pay | Admitting: Oncology

## 2014-03-29 ENCOUNTER — Other Ambulatory Visit: Payer: Self-pay | Admitting: Oncology

## 2014-03-29 DIAGNOSIS — C5702 Malignant neoplasm of left fallopian tube: Secondary | ICD-10-CM

## 2014-03-29 NOTE — Telephone Encounter (Signed)
ADDED APPTS PER 8/10 AND 8/12 POF. PT WAS TO HAVE INJ 8/11 PER 8/12 POF. POF NOT MARKED URGENT AND NOT PICKED UP UNTIL 8/12. S/W DESK NURSE AND INJ CAN BE GIVEN TOMORROW. ALSO LL HAS NO AVIALABILITY FOR F/U 8/31 AND PORT PLACEMENT SCHEDULED FOR 8/18 NOT 8/17. PER DESK NURSE USE NP SLOT FOR 8/31 AND PORT OK ON 8/18 BUT LEAVE TX AS SCHEDULED 8/17. CALLED PT BUT WAS NOT ABLE TO REACH HER. LM RE NEXT FEW APPTS INCLUDING 8/13 AND ASKED THAT PT CALL ME TO CONFIRM AND GET NEW SCHEDULE WHEN SHE COMES IN [facility]

## 2014-03-30 ENCOUNTER — Telehealth: Payer: Self-pay | Admitting: *Deleted

## 2014-03-30 ENCOUNTER — Telehealth: Payer: Self-pay | Admitting: Oncology

## 2014-03-30 ENCOUNTER — Ambulatory Visit: Payer: Medicare Other

## 2014-03-30 NOTE — Telephone Encounter (Signed)
pt lmonvm today that she would not be able to make inj appt due to she is in [place]. message sent to LL re r/s appt. per response from LL - ok not to try to r/s due to pt will not return until sunday night.

## 2014-03-30 NOTE — Telephone Encounter (Signed)
Called patient about missed appointment.  Left message to call back about missed appointment.

## 2014-03-31 ENCOUNTER — Encounter: Payer: Self-pay | Admitting: *Deleted

## 2014-03-31 NOTE — Progress Notes (Signed)
[facility] Psychosocial Distress Screening Clinical Social Work  Clinical Social Work was referred by distress screening protocol.  The patient scored a 5 on the Psychosocial Distress Thermometer which indicates moderate distress. Clinical Social Worker contacted patient by phone to assess for distress and other psychosocial needs. Joanne Copeland is currently in "the [place]"- she shared she lives there during the summer and stays with her daughter the day before, of, and after treatment.  Joanne Copeland requested CSW input her [facility] land line phone # into the system (CSW will input under demographics tab).   The patient shared she feels she is adjusting well to treatment and "really feels good".  She indicated no causes of distress at this time.  CSW encouraged patient to contact CSW with any additional questions or concerns.   ONCBCN DISTRESS SCREENING 03/03/2014  Screening Type Initial Screening  Mark the number that describes how much distress you have been experiencing in the past week 5  Practical problem type Housing  Family Problem type Partner  Emotional problem type Adjusting to illness  Spiritual/Religous concerns type (No Data)  Information Concerns Type (No Data)  Physical Problem type Nausea/vomiting;Sleep/insomnia  Physician notified of physical symptoms Yes    Clinical Social Worker follow up needed: No.  If yes, follow up plan:  Polo Riley, MSW, LCSW, OSW-C Clinical Social Worker [place] [phone]

## 2014-04-03 ENCOUNTER — Other Ambulatory Visit (HOSPITAL_BASED_OUTPATIENT_CLINIC_OR_DEPARTMENT_OTHER): Payer: Medicare Other

## 2014-04-03 ENCOUNTER — Ambulatory Visit (HOSPITAL_BASED_OUTPATIENT_CLINIC_OR_DEPARTMENT_OTHER): Payer: Medicare Other

## 2014-04-03 ENCOUNTER — Encounter (HOSPITAL_COMMUNITY): Payer: Self-pay | Admitting: Pharmacy Technician

## 2014-04-03 ENCOUNTER — Other Ambulatory Visit: Payer: Self-pay | Admitting: Radiology

## 2014-04-03 ENCOUNTER — Other Ambulatory Visit: Payer: Self-pay | Admitting: Oncology

## 2014-04-03 VITALS — BP 148/63 | HR 90 | Temp 97.9°F | Resp 18

## 2014-04-03 DIAGNOSIS — C5702 Malignant neoplasm of left fallopian tube: Secondary | ICD-10-CM

## 2014-04-03 DIAGNOSIS — E119 Type 2 diabetes mellitus without complications: Secondary | ICD-10-CM

## 2014-04-03 DIAGNOSIS — C5700 Malignant neoplasm of unspecified fallopian tube: Secondary | ICD-10-CM

## 2014-04-03 DIAGNOSIS — Z5111 Encounter for antineoplastic chemotherapy: Secondary | ICD-10-CM

## 2014-04-03 LAB — CBC WITH DIFFERENTIAL/PLATELET
BASO%: 0.3 % (ref 0.0–2.0)
BASOS ABS: 0 10*3/uL (ref 0.0–0.1)
EOS%: 0 % (ref 0.0–7.0)
Eosinophils Absolute: 0 10*3/uL (ref 0.0–0.5)
HEMATOCRIT: 37 % (ref 34.8–46.6)
HEMOGLOBIN: 12.3 g/dL (ref 11.6–15.9)
LYMPH#: 0.4 10*3/uL — AB (ref 0.9–3.3)
LYMPH%: 21.6 % (ref 14.0–49.7)
MCH: 30.2 pg (ref 25.1–34.0)
MCHC: 33.2 g/dL (ref 31.5–36.0)
MCV: 90.9 fL (ref 79.5–101.0)
MONO#: 0 10*3/uL — ABNORMAL LOW (ref 0.1–0.9)
MONO%: 1.1 % (ref 0.0–14.0)
NEUT#: 1.5 10*3/uL (ref 1.5–6.5)
NEUT%: 77 % — AB (ref 38.4–76.8)
Platelets: 344 10*3/uL (ref 145–400)
RBC: 4.07 10*6/uL (ref 3.70–5.45)
RDW: 16.4 % — ABNORMAL HIGH (ref 11.2–14.5)
WBC: 2 10*3/uL — ABNORMAL LOW (ref 3.9–10.3)

## 2014-04-03 LAB — COMPREHENSIVE METABOLIC PANEL (CC13)
ALBUMIN: 3.6 g/dL (ref 3.5–5.0)
ALT: 18 U/L (ref 0–55)
ANION GAP: 14 meq/L — AB (ref 3–11)
AST: 18 U/L (ref 5–34)
Alkaline Phosphatase: 56 U/L (ref 40–150)
BUN: 23.1 mg/dL (ref 7.0–26.0)
CALCIUM: 9.7 mg/dL (ref 8.4–10.4)
CHLORIDE: 102 meq/L (ref 98–109)
CO2: 19 mEq/L — ABNORMAL LOW (ref 22–29)
Creatinine: 1.3 mg/dL — ABNORMAL HIGH (ref 0.6–1.1)
GLUCOSE: 334 mg/dL — AB (ref 70–140)
POTASSIUM: 3.8 meq/L (ref 3.5–5.1)
Sodium: 135 mEq/L — ABNORMAL LOW (ref 136–145)
Total Bilirubin: 0.38 mg/dL (ref 0.20–1.20)
Total Protein: 6.9 g/dL (ref 6.4–8.3)

## 2014-04-03 MED ORDER — INSULIN REGULAR HUMAN 100 UNIT/ML IJ SOLN
2.0000 [IU] | Freq: Once | INTRAMUSCULAR | Status: AC | PRN
  Administered 2014-04-03: 2 [IU] via SUBCUTANEOUS
  Filled 2014-04-03: qty 0.02

## 2014-04-03 MED ORDER — FAMOTIDINE IN NACL 20-0.9 MG/50ML-% IV SOLN
INTRAVENOUS | Status: AC
  Filled 2014-04-03: qty 50

## 2014-04-03 MED ORDER — FAMOTIDINE IN NACL 20-0.9 MG/50ML-% IV SOLN
20.0000 mg | Freq: Once | INTRAVENOUS | Status: AC
  Administered 2014-04-03: 20 mg via INTRAVENOUS

## 2014-04-03 MED ORDER — DEXTROSE 5 % IV SOLN
80.0000 mg/m2 | Freq: Once | INTRAVENOUS | Status: AC
  Administered 2014-04-03: 138 mg via INTRAVENOUS
  Filled 2014-04-03: qty 23

## 2014-04-03 MED ORDER — DIPHENHYDRAMINE HCL 50 MG/ML IJ SOLN
25.0000 mg | Freq: Once | INTRAMUSCULAR | Status: AC
  Administered 2014-04-03: 25 mg via INTRAVENOUS

## 2014-04-03 MED ORDER — DEXAMETHASONE SODIUM PHOSPHATE 20 MG/5ML IJ SOLN
INTRAMUSCULAR | Status: AC
  Filled 2014-04-03: qty 5

## 2014-04-03 MED ORDER — ONDANSETRON 8 MG/50ML IVPB (CHCC)
8.0000 mg | Freq: Once | INTRAVENOUS | Status: AC
  Administered 2014-04-03: 8 mg via INTRAVENOUS

## 2014-04-03 MED ORDER — ONDANSETRON 8 MG/NS 50 ML IVPB
INTRAVENOUS | Status: AC
  Filled 2014-04-03: qty 8

## 2014-04-03 MED ORDER — DEXAMETHASONE SODIUM PHOSPHATE 20 MG/5ML IJ SOLN
20.0000 mg | Freq: Once | INTRAMUSCULAR | Status: AC
  Administered 2014-04-03: 20 mg via INTRAVENOUS

## 2014-04-03 MED ORDER — SODIUM CHLORIDE 0.9 % IV SOLN
Freq: Once | INTRAVENOUS | Status: AC
  Administered 2014-04-03: 11:00:00 via INTRAVENOUS

## 2014-04-03 NOTE — Progress Notes (Signed)
Discharged at 1304, ambulatory in no distress.

## 2014-04-03 NOTE — Patient Instructions (Signed)
[facility] Discharge Instructions for Patients Receiving Chemotherapy  Today you received the following chemotherapy agents Taxol  To help prevent nausea and vomiting after your treatment, we encourage you to take your nausea medication zofran and lorazepam as ordered by MD.   If you develop nausea and vomiting that is not controlled by your nausea medication, call the clinic.   BELOW ARE SYMPTOMS THAT SHOULD BE REPORTED IMMEDIATELY:  *FEVER GREATER THAN 100.5 F  *CHILLS WITH OR WITHOUT FEVER  NAUSEA AND VOMITING THAT IS NOT CONTROLLED WITH YOUR NAUSEA MEDICATION  *UNUSUAL SHORTNESS OF BREATH  *UNUSUAL BRUISING OR BLEEDING  TENDERNESS IN MOUTH AND THROAT WITH OR WITHOUT PRESENCE OF ULCERS  *URINARY PROBLEMS  *BOWEL PROBLEMS  UNUSUAL RASH Items with * indicate a potential emergency and should be followed up as soon as possible.  Feel free to call the clinic you have any questions or concerns. The clinic phone number is [phone].

## 2014-04-04 ENCOUNTER — Encounter (HOSPITAL_COMMUNITY): Payer: Self-pay

## 2014-04-04 ENCOUNTER — Ambulatory Visit (HOSPITAL_BASED_OUTPATIENT_CLINIC_OR_DEPARTMENT_OTHER): Payer: Medicare Other

## 2014-04-04 ENCOUNTER — Ambulatory Visit (HOSPITAL_COMMUNITY)
Admission: RE | Admit: 2014-04-04 | Discharge: 2014-04-04 | Disposition: A | Discharge status: Home / Self Care | Payer: Medicare Other | Source: Ambulatory Visit | Attending: Oncology | Admitting: Oncology

## 2014-04-04 ENCOUNTER — Other Ambulatory Visit: Payer: Self-pay | Admitting: Oncology

## 2014-04-04 VITALS — BP 126/84 | HR 76 | Temp 98.0°F

## 2014-04-04 DIAGNOSIS — C5700 Malignant neoplasm of unspecified fallopian tube: Secondary | ICD-10-CM | POA: Insufficient documentation

## 2014-04-04 DIAGNOSIS — Z8543 Personal history of malignant neoplasm of ovary: Secondary | ICD-10-CM | POA: Diagnosis not present

## 2014-04-04 DIAGNOSIS — I10 Essential (primary) hypertension: Secondary | ICD-10-CM | POA: Diagnosis not present

## 2014-04-04 DIAGNOSIS — Z452 Encounter for adjustment and management of vascular access device: Secondary | ICD-10-CM | POA: Diagnosis present

## 2014-04-04 DIAGNOSIS — Z7982 Long term (current) use of aspirin: Secondary | ICD-10-CM | POA: Diagnosis not present

## 2014-04-04 DIAGNOSIS — Z79899 Other long term (current) drug therapy: Secondary | ICD-10-CM | POA: Diagnosis not present

## 2014-04-04 DIAGNOSIS — E7800 Pure hypercholesterolemia, unspecified: Secondary | ICD-10-CM | POA: Insufficient documentation

## 2014-04-04 DIAGNOSIS — Z5189 Encounter for other specified aftercare: Secondary | ICD-10-CM

## 2014-04-04 DIAGNOSIS — C5702 Malignant neoplasm of left fallopian tube: Secondary | ICD-10-CM

## 2014-04-04 LAB — CBC WITH DIFFERENTIAL/PLATELET
Basophils Absolute: 0 10*3/uL (ref 0.0–0.1)
Basophils Relative: 0 % (ref 0–1)
EOS PCT: 0 % (ref 0–5)
Eosinophils Absolute: 0 10*3/uL (ref 0.0–0.7)
HEMATOCRIT: 34 % — AB (ref 36.0–46.0)
HEMOGLOBIN: 11.7 g/dL — AB (ref 12.0–15.0)
LYMPHS ABS: 0.9 10*3/uL (ref 0.7–4.0)
Lymphocytes Relative: 35 % (ref 12–46)
MCH: 30.4 pg (ref 26.0–34.0)
MCHC: 34.4 g/dL (ref 30.0–36.0)
MCV: 88.3 fL (ref 78.0–100.0)
MONOS PCT: 10 % (ref 3–12)
Monocytes Absolute: 0.3 10*3/uL (ref 0.1–1.0)
Neutro Abs: 1.4 10*3/uL — ABNORMAL LOW (ref 1.7–7.7)
Neutrophils Relative %: 55 % (ref 43–77)
Platelets: 335 10*3/uL (ref 150–400)
RBC: 3.85 MIL/uL — ABNORMAL LOW (ref 3.87–5.11)
RDW: 16.1 % — ABNORMAL HIGH (ref 11.5–15.5)
WBC: 2.6 10*3/uL — AB (ref 4.0–10.5)

## 2014-04-04 LAB — PROTIME-INR
INR: 0.89 (ref 0.00–1.49)
Prothrombin Time: 12.1 seconds (ref 11.6–15.2)

## 2014-04-04 MED ORDER — FENTANYL CITRATE 0.05 MG/ML IJ SOLN
INTRAMUSCULAR | Status: AC
  Filled 2014-04-04: qty 4

## 2014-04-04 MED ORDER — MIDAZOLAM HCL 2 MG/2ML IJ SOLN
INTRAMUSCULAR | Status: AC
  Filled 2014-04-04: qty 4

## 2014-04-04 MED ORDER — CEFAZOLIN SODIUM-DEXTROSE 2-3 GM-% IV SOLR
2.0000 g | Freq: Once | INTRAVENOUS | Status: AC
  Administered 2014-04-04: 2 g via INTRAVENOUS

## 2014-04-04 MED ORDER — TBO-FILGRASTIM 300 MCG/0.5ML ~~LOC~~ SOSY
300.0000 ug | PREFILLED_SYRINGE | Freq: Once | SUBCUTANEOUS | Status: AC
  Administered 2014-04-04: 300 ug via SUBCUTANEOUS
  Filled 2014-04-04: qty 0.5

## 2014-04-04 MED ORDER — SODIUM CHLORIDE 0.9 % IV SOLN
INTRAVENOUS | Status: DC
  Administered 2014-04-04: 500 mL via INTRAVENOUS

## 2014-04-04 MED ORDER — FENTANYL CITRATE 0.05 MG/ML IJ SOLN
INTRAMUSCULAR | Status: AC | PRN
  Administered 2014-04-04 (×2): 50 ug via INTRAVENOUS

## 2014-04-04 MED ORDER — HEPARIN SOD (PORK) LOCK FLUSH 100 UNIT/ML IV SOLN
500.0000 [IU] | Freq: Once | INTRAVENOUS | Status: AC
  Administered 2014-04-04: 500 [IU] via INTRAVENOUS

## 2014-04-04 MED ORDER — CEFAZOLIN SODIUM-DEXTROSE 2-3 GM-% IV SOLR
INTRAVENOUS | Status: AC
  Filled 2014-04-04: qty 50

## 2014-04-04 MED ORDER — MIDAZOLAM HCL 2 MG/2ML IJ SOLN
INTRAMUSCULAR | Status: AC | PRN
Start: 2014-04-04 — End: 2014-04-04
  Administered 2014-04-04 (×2): 1 mg via INTRAVENOUS

## 2014-04-04 NOTE — Patient Instructions (Signed)

## 2014-04-04 NOTE — Discharge Instructions (Signed)
Implanted Port Home Guide °An implanted port is a type of central line that is placed under the skin. Central lines are used to provide IV access when treatment or nutrition needs to be given through a person's veins. Implanted ports are used for long-term IV access. An implanted port may be placed because:  °· You need IV medicine that would be irritating to the small veins in your hands or arms.   °· You need long-term IV medicines, such as antibiotics.   °· You need IV nutrition for a long period.   °· You need frequent blood draws for lab tests.   °· You need dialysis.   °Implanted ports are usually placed in the chest area, but they can also be placed in the upper arm, the abdomen, or the leg. An implanted port has two main parts:  °· Reservoir. The reservoir is round and will appear as a small, raised area under your skin. The reservoir is the part where a needle is inserted to give medicines or draw blood.   °· Catheter. The catheter is a thin, flexible tube that extends from the reservoir. The catheter is placed into a large vein. Medicine that is inserted into the reservoir goes into the catheter and then into the vein.   °HOW WILL I CARE FOR MY INCISION SITE? °Do not get the incision site wet. Bathe or shower as directed by your health care provider.  °HOW IS MY PORT ACCESSED? °Special steps must be taken to access the port:  °· Before the port is accessed, a numbing cream can be placed on the skin. This helps numb the skin over the port site.   °· Your health care provider uses a sterile technique to access the port. °¨ Your health care provider must put on a mask and sterile gloves. °¨ The skin over your port is cleaned carefully with an antiseptic and allowed to dry. °¨ The port is gently pinched between sterile gloves, and a needle is inserted into the port. °· Only "non-coring" port needles should be used to access the port. Once the port is accessed, a blood return should be checked. This helps  ensure that the port is in the vein and is not clogged.   °· If your port needs to remain accessed for a constant infusion, a clear (transparent) bandage will be placed over the needle site. The bandage and needle will need to be changed every week, or as directed by your health care provider.   °· Keep the bandage covering the needle clean and dry. Do not get it wet. Follow your health care provider's instructions on how to take a shower or bath while the port is accessed.   °· If your port does not need to stay accessed, no bandage is needed over the port.   °WHAT IS FLUSHING? °Flushing helps keep the port from getting clogged. Follow your health care provider's instructions on how and when to flush the port. Ports are usually flushed with saline solution or a medicine called heparin. The need for flushing will depend on how the port is used.  °· If the port is used for intermittent medicines or blood draws, the port will need to be flushed:   °¨ After medicines have been given.   °¨ After blood has been drawn.   °¨ As part of routine maintenance.   °· If a constant infusion is running, the port may not need to be flushed.   °HOW LONG WILL MY PORT STAY IMPLANTED? °The port can stay in for as long as your health care   provider thinks it is needed. When it is time for the port to come out, surgery will be done to remove it. The procedure is similar to the one performed when the port was put in.  °WHEN SHOULD I SEEK IMMEDIATE MEDICAL CARE? °When you have an implanted port, you should seek immediate medical care if:  °· You notice a bad smell coming from the incision site.   °· You have swelling, redness, or drainage at the incision site.   °· You have more swelling or pain at the port site or the surrounding area.   °· You have a fever that is not controlled with medicine. °Document Released: 08/04/2005 Document Revised: 05/25/2013 Document Reviewed: 04/11/2013 °ExitCare® Patient Information ©2015 [employer]. This  information is not intended to replace advice given to you by your health care provider. Make sure you discuss any questions you have with your health care provider. ° °Conscious Sedation, Adult, Care After °Refer to this sheet in the next few weeks. These instructions provide you with information on caring for yourself after your procedure. Your health care provider may also give you more specific instructions. Your treatment has been planned according to current medical practices, but problems sometimes occur. Call your health care provider if you have any problems or questions after your procedure. °WHAT TO EXPECT AFTER THE PROCEDURE  °After your procedure: °· You may feel sleepy, clumsy, and have poor balance for several hours. °· Vomiting may occur if you eat too soon after the procedure. °HOME CARE INSTRUCTIONS °· Do not participate in any activities where you could become injured for at least 24 hours. Do not: °¨ Drive. °¨ Swim. °¨ Ride a bicycle. °¨ Operate heavy machinery. °¨ Cook. °¨ Use power tools. °¨ Climb ladders. °¨ Work from a high place. °· Do not make important decisions or sign legal documents until you are improved. °· If you vomit, drink water, juice, or soup when you can drink without vomiting. Make sure you have little or no nausea before eating solid foods. °· Only take over-the-counter or prescription medicines for pain, discomfort, or fever as directed by your health care provider. °· Make sure you and your family fully understand everything about the medicines given to you, including what side effects may occur. °· You should not drink alcohol, take sleeping pills, or take medicines that cause drowsiness for at least 24 hours. °· If you smoke, do not smoke without supervision. °· If you are feeling better, you may resume normal activities 24 hours after you were sedated. °· Keep all appointments with your health care provider. °SEEK MEDICAL CARE IF: °· Your skin is pale or bluish in  color. °· You continue to feel nauseous or vomit. °· Your pain is getting worse and is not helped by medicine. °· You have bleeding or swelling. °· You are still sleepy or feeling clumsy after 24 hours. °SEEK IMMEDIATE MEDICAL CARE IF: °· You develop a rash. °· You have difficulty breathing. °· You develop any type of allergic problem. °· You have a fever. °MAKE SURE YOU: °· Understand these instructions. °· Will watch your condition. °· Will get help right away if you are not doing well or get worse. °Document Released: 05/25/2013 Document Reviewed: 05/25/2013 °ExitCare® Patient Information ©2015 [employer]. This information is not intended to replace advice given to you by your health care provider. Make sure you discuss any questions you have with your health care provider. ° ° °

## 2014-04-04 NOTE — H&P (Signed)
Chief Complaint: "I am here for my port." Referring Physician: Dr. Marko Plume  HPI: Joanne Copeland is an 73 y.o. 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 She is here today for a port a catheter. She denies any chest pain, shortness of breath or palpitations. She denies any active signs of bleeding or excessive bruising. She denies any recent fever or chills. The patient denies any history of sleep apnea or chronic oxygen use. She has previously tolerated sedation without complications. She does admit to some nausea and has had (2) sessions of chemotherapy already.   Past Medical History:  Past Medical History  Diagnosis Date  . Pelvic mass   . Hypertension   . Hypercholesterolemia   . Ovarian cancer     Past Surgical History:  Past Surgical History  Procedure Laterality Date  . Total abdominal hysterectomy w/ bilateral salpingoophorectomy  01/05/2014  . Radical tumor debulking  01/05/2014  . Omentectomy pplnd  01/05/2014    Family History: History reviewed. No pertinent family history.  Social History:  reports that she has never smoked. She does not have any smokeless tobacco history on file. She reports that she drinks about 4.2 ounces of alcohol per week. She reports that she does not use illicit drugs.  Allergies:  Allergies  Allergen Reactions  . Other     Melon cucumber    Medications:   Medication List    ASK your doctor about these medications       acidophilus Caps capsule  Take by mouth daily. [facility] brand probiotic     amLODipine 5 MG tablet  Commonly known as:  NORVASC  Take 5 mg by mouth at bedtime.     Arginine 500 MG Caps  Take 1 capsule by mouth daily.     aspirin 325 MG tablet  Take 325 mg by mouth daily.     CAL-MAG-ZINC PO  Take 1 tablet by mouth daily.     cholecalciferol 1000 UNITS tablet  Commonly known as:  VITAMIN D  Take 1,000 Units by mouth daily.     folic acid 1 MG tablet  Commonly known as:  FOLVITE  Take 1 mg by  mouth daily.     GARLIC PO  Take 1 tablet by mouth daily.     KRILL OIL PO  Take 1 tablet by mouth daily.     LORazepam 0.5 MG tablet  Commonly known as:  ATIVAN  Take 0.5 mg by mouth every 6 (six) hours as needed (FOR NAUSEA).     ondansetron 8 MG tablet  Commonly known as:  ZOFRAN  Take 1 tablet (8 mg total) by mouth every 8 (eight) hours as needed for nausea or vomiting. Will not make drowsy     oxyCODONE-acetaminophen 5-325 MG per tablet  Commonly known as:  PERCOCET/ROXICET  Take 1 tablet by mouth every 4 (four) hours as needed for moderate pain or severe pain.     prenatal multivitamin Tabs tablet  Take 1 tablet by mouth daily at 12 noon.     sennosides-docusate sodium 8.6-50 MG tablet  Commonly known as:  SENOKOT-S  Take 1 tablet by mouth at bedtime.     Vitamin E 100 UNITS Tabs  Take by mouth.       Please HPI for pertinent positives, otherwise complete 10 system ROS negative.  Physical Exam: BP 161/68  Pulse 92  Temp(Src) 98.1 F (36.7 C) (Oral)  Resp 18  Ht 5\' 4"  (1.626 m)  Wt 152 lb (68.947 kg)  BMI 26.08 kg/m2  SpO2 100% Body mass index is 26.08 kg/(m^2).  General Appearance:  Alert, cooperative, no distress  Head:  Normocephalic, without obvious abnormality, atraumatic  Neck: Supple, symmetrical, trachea midline  Lungs:   Clear to auscultation bilaterally, no w/r/r, respirations unlabored without use of accessory muscles.  Chest Wall:  No tenderness or deformity  Heart:  Regular rate and rhythm, S1, S2 normal, no murmur, rub or gallop.  Abdomen:   Soft, non-tender, non distended.  Extremities: Extremities normal, atraumatic, no cyanosis or edema  Neurologic: Normal affect, no gross deficits.   Results for orders placed during the hospital encounter of 04/04/14 (from the past 48 hour(s))  CBC WITH DIFFERENTIAL     Status: Abnormal   Collection Time    04/04/14  8:00 AM      Result Value Ref Range   WBC 2.6 (*) 4.0 - 10.5 K/uL   RBC 3.85 (*)  3.87 - 5.11 MIL/uL   Hemoglobin 11.7 (*) 12.0 - 15.0 g/dL   HCT 34.0 (*) 36.0 - 46.0 %   MCV 88.3  78.0 - 100.0 fL   MCH 30.4  26.0 - 34.0 pg   MCHC 34.4  30.0 - 36.0 g/dL   RDW 16.1 (*) 11.5 - 15.5 %   Platelets 335  150 - 400 K/uL   Neutrophils Relative % 55  43 - 77 %   Neutro Abs 1.4 (*) 1.7 - 7.7 K/uL   Lymphocytes Relative 35  12 - 46 %   Lymphs Abs 0.9  0.7 - 4.0 K/uL   Monocytes Relative 10  3 - 12 %   Monocytes Absolute 0.3  0.1 - 1.0 K/uL   Eosinophils Relative 0  0 - 5 %   Eosinophils Absolute 0.0  0.0 - 0.7 K/uL   Basophils Relative 0  0 - 1 %   Basophils Absolute 0.0  0.0 - 0.1 K/uL  PROTIME-INR     Status: None   Collection Time    04/04/14  8:00 AM      Result Value Ref Range   Prothrombin Time 12.1  11.6 - 15.2 seconds   INR 0.89  0.00 - 1.49   No results found.  Assessment/Plan High grade serous carcinoma of left fallopian tube s/p debulking and started chemotherapy Scheduled today for a port a catheter placement with moderate sedation.  Patient has been NPO, no blood thinners taken, afebrile, labs reviewed. Risks and Benefits discussed with the patient. All of the patient's questions were answered, patient is agreeable to proceed. Consent signed and in chart.   Tsosie Billing D PA-C 04/04/2014, 8:48 AM

## 2014-04-04 NOTE — Procedures (Signed)
Interventional Radiology Procedure Note  Procedure: Placement of a right IJ approach single lumen PowerPort.  Tip is positioned at the superior cavoatrial junction and catheter is ready for immediate use.  Complications: No immediate Recommendations:  - Ok to shower tomorrow - Do not submerge for 7 days - Routine line care   Signed,  Alroy Portela K. Alder Murri, MD [facility]   

## 2014-04-10 ENCOUNTER — Ambulatory Visit (HOSPITAL_BASED_OUTPATIENT_CLINIC_OR_DEPARTMENT_OTHER): Payer: Medicare Other

## 2014-04-10 ENCOUNTER — Ambulatory Visit (HOSPITAL_BASED_OUTPATIENT_CLINIC_OR_DEPARTMENT_OTHER): Payer: Medicare Other | Admitting: Adult Health

## 2014-04-10 ENCOUNTER — Other Ambulatory Visit: Payer: Self-pay | Admitting: *Deleted

## 2014-04-10 ENCOUNTER — Encounter: Payer: Self-pay | Admitting: Adult Health

## 2014-04-10 VITALS — BP 149/66 | HR 101 | Temp 98.2°F | Resp 18 | Ht 64.0 in | Wt 153.6 lb

## 2014-04-10 DIAGNOSIS — C5702 Malignant neoplasm of left fallopian tube: Secondary | ICD-10-CM

## 2014-04-10 DIAGNOSIS — C5700 Malignant neoplasm of unspecified fallopian tube: Secondary | ICD-10-CM

## 2014-04-10 DIAGNOSIS — Z5111 Encounter for antineoplastic chemotherapy: Secondary | ICD-10-CM

## 2014-04-10 DIAGNOSIS — I10 Essential (primary) hypertension: Secondary | ICD-10-CM

## 2014-04-10 DIAGNOSIS — E7800 Pure hypercholesterolemia, unspecified: Secondary | ICD-10-CM

## 2014-04-10 DIAGNOSIS — D709 Neutropenia, unspecified: Secondary | ICD-10-CM

## 2014-04-10 LAB — CBC WITH DIFFERENTIAL/PLATELET
BASO%: 0.5 % (ref 0.0–2.0)
BASOS ABS: 0 10*3/uL (ref 0.0–0.1)
EOS%: 0.1 % (ref 0.0–7.0)
Eosinophils Absolute: 0 10*3/uL (ref 0.0–0.5)
HCT: 36.5 % (ref 34.8–46.6)
HEMOGLOBIN: 12.1 g/dL (ref 11.6–15.9)
LYMPH%: 14.7 % (ref 14.0–49.7)
MCH: 30.2 pg (ref 25.1–34.0)
MCHC: 33 g/dL (ref 31.5–36.0)
MCV: 91.6 fL (ref 79.5–101.0)
MONO#: 0 10*3/uL — AB (ref 0.1–0.9)
MONO%: 0.4 % (ref 0.0–14.0)
NEUT%: 84.3 % — ABNORMAL HIGH (ref 38.4–76.8)
NEUTROS ABS: 4.6 10*3/uL (ref 1.5–6.5)
Platelets: 247 10*3/uL (ref 145–400)
RBC: 3.99 10*6/uL (ref 3.70–5.45)
RDW: 17.4 % — ABNORMAL HIGH (ref 11.2–14.5)
WBC: 5.5 10*3/uL (ref 3.9–10.3)
lymph#: 0.8 10*3/uL — ABNORMAL LOW (ref 0.9–3.3)

## 2014-04-10 LAB — COMPREHENSIVE METABOLIC PANEL (CC13)
ALBUMIN: 3.7 g/dL (ref 3.5–5.0)
ALK PHOS: 54 U/L (ref 40–150)
ALT: 14 U/L (ref 0–55)
ANION GAP: 12 meq/L — AB (ref 3–11)
AST: 19 U/L (ref 5–34)
BILIRUBIN TOTAL: 0.36 mg/dL (ref 0.20–1.20)
BUN: 23.8 mg/dL (ref 7.0–26.0)
CO2: 21 mEq/L — ABNORMAL LOW (ref 22–29)
Calcium: 9.6 mg/dL (ref 8.4–10.4)
Chloride: 105 mEq/L (ref 98–109)
Creatinine: 1 mg/dL (ref 0.6–1.1)
GLUCOSE: 170 mg/dL — AB (ref 70–140)
POTASSIUM: 4.1 meq/L (ref 3.5–5.1)
Sodium: 137 mEq/L (ref 136–145)
Total Protein: 7 g/dL (ref 6.4–8.3)

## 2014-04-10 MED ORDER — SODIUM CHLORIDE 0.9 % IV SOLN
Freq: Once | INTRAVENOUS | Status: AC
  Administered 2014-04-10: 11:00:00 via INTRAVENOUS

## 2014-04-10 MED ORDER — ONDANSETRON 8 MG/NS 50 ML IVPB
INTRAVENOUS | Status: AC
  Filled 2014-04-10: qty 8

## 2014-04-10 MED ORDER — FAMOTIDINE IN NACL 20-0.9 MG/50ML-% IV SOLN
20.0000 mg | Freq: Once | INTRAVENOUS | Status: AC
  Administered 2014-04-10: 20 mg via INTRAVENOUS

## 2014-04-10 MED ORDER — SODIUM CHLORIDE 0.9 % IJ SOLN
10.0000 mL | INTRAMUSCULAR | Status: DC | PRN
  Administered 2014-04-10: 10 mL
  Filled 2014-04-10: qty 10

## 2014-04-10 MED ORDER — PACLITAXEL CHEMO INJECTION 300 MG/50ML
80.0000 mg/m2 | Freq: Once | INTRAVENOUS | Status: AC
  Administered 2014-04-10: 138 mg via INTRAVENOUS
  Filled 2014-04-10: qty 23

## 2014-04-10 MED ORDER — FAMOTIDINE IN NACL 20-0.9 MG/50ML-% IV SOLN
INTRAVENOUS | Status: AC
  Filled 2014-04-10: qty 50

## 2014-04-10 MED ORDER — HEPARIN SOD (PORK) LOCK FLUSH 100 UNIT/ML IV SOLN
500.0000 [IU] | Freq: Once | INTRAVENOUS | Status: AC | PRN
  Administered 2014-04-10: 500 [IU]
  Filled 2014-04-10: qty 5

## 2014-04-10 MED ORDER — ONDANSETRON 8 MG/50ML IVPB (CHCC)
8.0000 mg | Freq: Once | INTRAVENOUS | Status: AC
  Administered 2014-04-10: 8 mg via INTRAVENOUS

## 2014-04-10 MED ORDER — DIPHENHYDRAMINE HCL 50 MG/ML IJ SOLN
INTRAMUSCULAR | Status: AC
  Filled 2014-04-10: qty 1

## 2014-04-10 MED ORDER — DIPHENHYDRAMINE HCL 50 MG/ML IJ SOLN
25.0000 mg | Freq: Once | INTRAMUSCULAR | Status: AC
  Administered 2014-04-10: 25 mg via INTRAVENOUS

## 2014-04-10 MED ORDER — DEXAMETHASONE SODIUM PHOSPHATE 20 MG/5ML IJ SOLN
20.0000 mg | Freq: Once | INTRAMUSCULAR | Status: AC
  Administered 2014-04-10: 20 mg via INTRAVENOUS

## 2014-04-10 MED ORDER — DEXAMETHASONE SODIUM PHOSPHATE 20 MG/5ML IJ SOLN
INTRAMUSCULAR | Status: AC
  Filled 2014-04-10: qty 5

## 2014-04-10 NOTE — Patient Instructions (Signed)
[facility] Discharge Instructions for Patients Receiving Chemotherapy  Today you received the following chemotherapy agents Paclitaxel.  To help prevent nausea and vomiting after your treatment, we encourage you to take your nausea medication as directed.    If you develop nausea and vomiting that is not controlled by your nausea medication, call the clinic.   BELOW ARE SYMPTOMS THAT SHOULD BE REPORTED IMMEDIATELY:  *FEVER GREATER THAN 100.5 F  *CHILLS WITH OR WITHOUT FEVER  NAUSEA AND VOMITING THAT IS NOT CONTROLLED WITH YOUR NAUSEA MEDICATION  *UNUSUAL SHORTNESS OF BREATH  *UNUSUAL BRUISING OR BLEEDING  TENDERNESS IN MOUTH AND THROAT WITH OR WITHOUT PRESENCE OF ULCERS  *URINARY PROBLEMS  *BOWEL PROBLEMS  UNUSUAL RASH Items with * indicate a potential emergency and should be followed up as soon as possible.  Feel free to call the clinic you have any questions or concerns. The clinic phone number is [phone].    

## 2014-04-10 NOTE — Progress Notes (Signed)
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 She is due day 15 cycle 2 today. Joanne Copeland has done well with chemotherapy thus far other than neutropenia on day 15 cycle 1 such that treatment was delayed a week, with "day 15" cycle 1 given on 03-20-14. Due to the neutropenia, carboplatin dose was reduced slightly and granix 300 mg was added on days 2 and 9. Only problems other than the neutropenia have been agitation with benadryl 50 mg premed for taxol, difficulty sleeping after premed decadron (20 mg 12 hrs prior to taxol) and multiple attempts needed to establish IV access for treatments. As she has had no reactions to taxol, her Benadryl was dose reduced.  She had a port placed in IR last week and tolerated it well.  She is doing well today and tells me that with the exception of being very awake the day of treatment she is doing very well with it.  She denies fevers, chills, nausea, vomiting, constipation, diarrhea, numbness/tingling, nail changes, skin changes, or any further concerns.      ONCOLOGIC HISTORY Joanne Copeland had been in usual good health until acute abdominal pain for which she was seen in ED in [place] on 01-02-14 and admitted to [facility] Per Joanne Copeland, CT scans were done, tho I do not have those reports; laboratory results also do not accompany (pre op CA125 not known). She was taken to optimal debulking by Dr Milana Kidney on 01-05-14 (hysterectomy, BSO, PPLND,omentectomy, pelvic peritonectomy and appendectomy. 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 No washings reported in this information. Pathologic stage [id number] (stage IIIA). She was discharged home on 01-11-14. She was doing well from the surgery when seen by Dr James Ivanoff on 01-18-14, and has contiued to recover from that without problems. She was not on home lovenox.  Start of treatment has been delayed as Joanne Copeland moved back to [place] after follow up visit with Dr Robley Fries on 01-18-14. She saw Dr Skeet Latch in consultation on 02-09-14, also with recommendation for adjuvant chemotherapy, which began with dose dense taxol carboplatin on 02-28-14. She was neutropenic by day 15 cycle 1, with that treatment delayed x 1 week.  ROS: A 10 point review of systems was conducted and is otherwise negative except for what is noted above.    Objective:  Vital signs in last 24 hours:  BP 149/66  Pulse 101  Temp(Src) 98.2 F (36.8 C) (Oral)  Resp 18  Ht 5\' 4"  (1.626 m)  Wt 153 lb 9.6 oz (69.673 kg)  BMI 26.35 kg/m2 GENERAL: Joanne Copeland is a well appearing female in no acute distress HEENT:  Sclerae anicteric.  Oropharynx clear and moist. No ulcerations or evidence of oropharyngeal candidiasis. Neck is supple.  NODES:  No cervical, supraclavicular, or axillary lymphadenopathy palpated.  BREAST EXAM:  Deferred. LUNGS:  Clear to auscultation bilaterally.  No wheezes or rhonchi. HEART:  Regular rate and rhythm. No  murmur appreciated. ABDOMEN:  Soft, nontender.  Positive, normoactive bowel sounds. No organomegaly palpated. MSK:  No focal spinal tenderness to palpation. Full range of motion bilaterally in the upper extremities. EXTREMITIES:  No peripheral edema.   SKIN:  Clear with no obvious rashes or skin changes. No nail dyscrasia. NEURO:   Nonfocal. Well oriented.  Appropriate affect.     Lab Results:  Results for orders placed in visit on 04/10/14  CBC WITH DIFFERENTIAL      Result Value Ref Range   WBC 5.5  3.9 - 10.3 10e3/uL   NEUT# 4.6  1.5 - 6.5 10e3/uL   HGB 12.1  11.6 - 15.9 g/dL   HCT 36.5  34.8 - 46.6 %   Platelets 247  145 - 400 10e3/uL   MCV 91.6  79.5 - 101.0 fL   MCH 30.2  25.1 - 34.0 pg   MCHC 33.0  31.5 - 36.0 g/dL   RBC 3.99  3.70 - 5.45 10e6/uL   RDW 17.4 (*) 11.2 - 14.5 %   lymph# 0.8 (*) 0.9 - 3.3 10e3/uL   MONO# 0.0 (*) 0.1 - 0.9 10e3/uL   Eosinophils Absolute 0.0  0.0 - 0.5 10e3/uL   Basophils Absolute 0.0  0.0 - 0.1 10e3/uL   NEUT% 84.3 (*) 38.4 - 76.8 %   LYMPH% 14.7  14.0 - 49.7 %   MONO% 0.4  0.0 - 14.0 %   EOS% 0.1  0.0 - 7.0 %   BASO% 0.5  0.0 - 2.0 %  COMPREHENSIVE METABOLIC PANEL (JJ88)      Result Value Ref Range   Sodium 137  136 - 145 mEq/L   Potassium 4.1  3.5 - 5.1 mEq/L   Chloride 105  98 - 109 mEq/L   CO2 21 (*) 22 - 29 mEq/L   Glucose 170 (*) 70 - 140 mg/dl   BUN 23.8  7.0 - 26.0 mg/dL   Creatinine 1.0  0.6 - 1.1 mg/dL   Total Bilirubin 0.36  0.20 - 1.20 mg/dL   Alkaline Phosphatase 54  40 - 150 U/L   AST 19  5 - 34 U/L   ALT 14  0 - 55 U/L   Total Protein 7.0  6.4 - 8.3 g/dL   Albumin 3.7  3.5 - 5.0 g/dL   Calcium 9.6  8.4 - 10.4 mg/dL   Anion Gap 12 (*) 3 - 11 mEq/L    Studies/Results:  No results found.  Medications: Current Outpatient Prescriptions  Medication Sig Dispense Refill  . acidophilus (RISAQUAD) CAPS capsule Take by mouth daily. [facility] brand probiotic      . amLODipine (NORVASC) 5 MG tablet Take 5 mg by mouth at bedtime.      . Arginine 500 MG CAPS Take 1 capsule by mouth daily.      Marland Kitchen aspirin 325 MG tablet Take 325 mg by mouth daily.      . Calcium-Magnesium-Zinc (CAL-MAG-ZINC PO) Take 1 tablet by mouth daily.      . cholecalciferol (VITAMIN D) 1000 UNITS tablet Take 1,000 Units by mouth daily.      . folic acid (FOLVITE) 1 MG tablet Take 1 mg  by mouth daily.      Marland Kitchen GARLIC PO Take 1 tablet by mouth daily.      Marland Kitchen KRILL OIL PO Take 1 tablet by mouth daily.      Marland Kitchen LORazepam (ATIVAN) 0.5 MG tablet Take 0.5 mg by mouth every 6 (six) hours as needed (FOR NAUSEA).      Marland Kitchen  ondansetron (ZOFRAN) 8 MG tablet Take 1 tablet (8 mg total) by mouth every 8 (eight) hours as needed for nausea or vomiting. Will not make drowsy  30 tablet  1  . Prenatal Vit-Fe Fumarate-FA (PRENATAL MULTIVITAMIN) TABS tablet Take 1 tablet by mouth daily at 12 noon.      . sennosides-docusate sodium (SENOKOT-S) 8.6-50 MG tablet Take 1 tablet by mouth at bedtime.  30 tablet  5  . Vitamin E 100 UNITS TABS Take by mouth.      . oxyCODONE-acetaminophen (PERCOCET/ROXICET) 5-325 MG per tablet Take 1 tablet by mouth every 4 (four) hours as needed for moderate pain or severe pain.        No current facility-administered medications for this visit.   Facility-Administered Medications Ordered in Other Visits  Medication Dose Route Frequency Provider Last Rate Last Dose  . sodium chloride 0.9 % injection 10 mL  10 mL Intracatheter PRN Lennis Marion Downer, MD   10 mL at 04/10/14 1244     DISCUSSION: Joanne Copeland is doing very well today.  Her lab work is stable and I did review this with her in detail.  She will proceed with cycle 2 day 15 of treatment which consists of Paclitaxel alone.  She has no sign of peripheral neuropathy and seems to be tolerating treatment rather well.  She did have an elevated blood glucose last week, and that is better today.   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 Adjustments with chemo and premeds as noted. Added granix days 2 and 9, labs are stable today.  2.steroid induced hyperglycemia:  Her blood sugar is 174 today.   3.inadequate peripheral IV access for full course of chemo: port in place 4.thickening left breast on PE with history of  some finding on that side previously:  5.elevated cholesterol  6..hypertension: appreciate adjustment in medications by Dr Mancel Bale 7.colon polyps: due colonoscopy fall 2015  8.social: has just moved back to [place] area after 17 years away. Husband with some dementia.  Latishia will return in one week for labs and f/u with dr. Marko Plume and treatment.   She knows to call us in the interim for any questions or concerns.  We can certainly see her sooner if needed.  I spent 25 minutes counseling the Joanne Copeland face to face.  The total time spent in the appointment was 30 minutes.  Minette Headland, [place] [phone] 04/10/2014, 2:36 PM

## 2014-04-15 ENCOUNTER — Other Ambulatory Visit: Payer: Self-pay | Admitting: Oncology

## 2014-04-17 ENCOUNTER — Ambulatory Visit: Payer: Medicare Other

## 2014-04-17 ENCOUNTER — Ambulatory Visit (HOSPITAL_BASED_OUTPATIENT_CLINIC_OR_DEPARTMENT_OTHER): Payer: Medicare Other

## 2014-04-17 ENCOUNTER — Other Ambulatory Visit (HOSPITAL_BASED_OUTPATIENT_CLINIC_OR_DEPARTMENT_OTHER): Payer: Medicare Other

## 2014-04-17 ENCOUNTER — Ambulatory Visit (HOSPITAL_BASED_OUTPATIENT_CLINIC_OR_DEPARTMENT_OTHER): Payer: Medicare Other | Admitting: Oncology

## 2014-04-17 VITALS — BP 148/63 | HR 85 | Temp 98.0°F | Resp 24 | Ht 64.0 in | Wt 155.0 lb

## 2014-04-17 DIAGNOSIS — C5700 Malignant neoplasm of unspecified fallopian tube: Secondary | ICD-10-CM

## 2014-04-17 DIAGNOSIS — Z5111 Encounter for antineoplastic chemotherapy: Secondary | ICD-10-CM

## 2014-04-17 DIAGNOSIS — C5702 Malignant neoplasm of left fallopian tube: Secondary | ICD-10-CM

## 2014-04-17 DIAGNOSIS — R7309 Other abnormal glucose: Secondary | ICD-10-CM

## 2014-04-17 DIAGNOSIS — Z95828 Presence of other vascular implants and grafts: Secondary | ICD-10-CM

## 2014-04-17 LAB — CBC WITH DIFFERENTIAL/PLATELET
BASO%: 0.2 % (ref 0.0–2.0)
BASOS ABS: 0 10*3/uL (ref 0.0–0.1)
EOS ABS: 0 10*3/uL (ref 0.0–0.5)
EOS%: 0 % (ref 0.0–7.0)
HEMATOCRIT: 33.4 % — AB (ref 34.8–46.6)
HGB: 11.2 g/dL — ABNORMAL LOW (ref 11.6–15.9)
LYMPH#: 0.5 10*3/uL — AB (ref 0.9–3.3)
LYMPH%: 8.5 % — ABNORMAL LOW (ref 14.0–49.7)
MCH: 30.9 pg (ref 25.1–34.0)
MCHC: 33.5 g/dL (ref 31.5–36.0)
MCV: 92 fL (ref 79.5–101.0)
MONO#: 0 10*3/uL — ABNORMAL LOW (ref 0.1–0.9)
MONO%: 0.4 % (ref 0.0–14.0)
NEUT%: 90.9 % — AB (ref 38.4–76.8)
NEUTROS ABS: 5 10*3/uL (ref 1.5–6.5)
Platelets: 174 10*3/uL (ref 145–400)
RBC: 3.63 10*6/uL — ABNORMAL LOW (ref 3.70–5.45)
RDW: 17.3 % — ABNORMAL HIGH (ref 11.2–14.5)
WBC: 5.5 10*3/uL (ref 3.9–10.3)

## 2014-04-17 LAB — COMPREHENSIVE METABOLIC PANEL (CC13)
ALBUMIN: 3.5 g/dL (ref 3.5–5.0)
ALT: 18 U/L (ref 0–55)
ANION GAP: 11 meq/L (ref 3–11)
AST: 18 U/L (ref 5–34)
Alkaline Phosphatase: 52 U/L (ref 40–150)
BUN: 18.9 mg/dL (ref 7.0–26.0)
CALCIUM: 9.6 mg/dL (ref 8.4–10.4)
CHLORIDE: 104 meq/L (ref 98–109)
CO2: 23 mEq/L (ref 22–29)
CREATININE: 1 mg/dL (ref 0.6–1.1)
GLUCOSE: 163 mg/dL — AB (ref 70–140)
POTASSIUM: 4 meq/L (ref 3.5–5.1)
Sodium: 139 mEq/L (ref 136–145)
Total Bilirubin: 0.49 mg/dL (ref 0.20–1.20)
Total Protein: 6.8 g/dL (ref 6.4–8.3)

## 2014-04-17 MED ORDER — SODIUM CHLORIDE 0.9 % IV SOLN
Freq: Once | INTRAVENOUS | Status: AC
  Administered 2014-04-17: 12:00:00 via INTRAVENOUS

## 2014-04-17 MED ORDER — HEPARIN SOD (PORK) LOCK FLUSH 100 UNIT/ML IV SOLN
500.0000 [IU] | Freq: Once | INTRAVENOUS | Status: AC
  Administered 2014-04-17: 500 [IU] via INTRAVENOUS
  Filled 2014-04-17: qty 5

## 2014-04-17 MED ORDER — FAMOTIDINE IN NACL 20-0.9 MG/50ML-% IV SOLN
INTRAVENOUS | Status: AC
  Filled 2014-04-17: qty 50

## 2014-04-17 MED ORDER — HEPARIN SOD (PORK) LOCK FLUSH 100 UNIT/ML IV SOLN
500.0000 [IU] | Freq: Once | INTRAVENOUS | Status: AC | PRN
  Administered 2014-04-17: 500 [IU]
  Filled 2014-04-17: qty 5

## 2014-04-17 MED ORDER — FAMOTIDINE IN NACL 20-0.9 MG/50ML-% IV SOLN
20.0000 mg | Freq: Once | INTRAVENOUS | Status: AC
  Administered 2014-04-17: 20 mg via INTRAVENOUS

## 2014-04-17 MED ORDER — SODIUM CHLORIDE 0.9 % IJ SOLN
10.0000 mL | INTRAMUSCULAR | Status: DC | PRN
  Administered 2014-04-17: 10 mL
  Filled 2014-04-17: qty 10

## 2014-04-17 MED ORDER — SODIUM CHLORIDE 0.9 % IJ SOLN
10.0000 mL | INTRAMUSCULAR | Status: DC | PRN
  Administered 2014-04-17: 10 mL via INTRAVENOUS
  Filled 2014-04-17: qty 10

## 2014-04-17 MED ORDER — ONDANSETRON 16 MG/50ML IVPB (CHCC)
INTRAVENOUS | Status: AC
  Filled 2014-04-17: qty 16

## 2014-04-17 MED ORDER — DEXAMETHASONE SODIUM PHOSPHATE 20 MG/5ML IJ SOLN
20.0000 mg | Freq: Once | INTRAMUSCULAR | Status: AC
Start: 2014-04-17 — End: 2014-04-17
  Administered 2014-04-17: 20 mg via INTRAVENOUS

## 2014-04-17 MED ORDER — DIPHENHYDRAMINE HCL 50 MG/ML IJ SOLN
INTRAMUSCULAR | Status: AC
  Filled 2014-04-17: qty 1

## 2014-04-17 MED ORDER — DEXAMETHASONE SODIUM PHOSPHATE 20 MG/5ML IJ SOLN
INTRAMUSCULAR | Status: AC
  Filled 2014-04-17: qty 5

## 2014-04-17 MED ORDER — ONDANSETRON 16 MG/50ML IVPB (CHCC)
16.0000 mg | Freq: Once | INTRAVENOUS | Status: AC
  Administered 2014-04-17: 16 mg via INTRAVENOUS

## 2014-04-17 MED ORDER — PACLITAXEL CHEMO INJECTION 300 MG/50ML
80.0000 mg/m2 | Freq: Once | INTRAVENOUS | Status: AC
  Administered 2014-04-17: 138 mg via INTRAVENOUS
  Filled 2014-04-17: qty 23

## 2014-04-17 MED ORDER — DIPHENHYDRAMINE HCL 50 MG/ML IJ SOLN
25.0000 mg | Freq: Once | INTRAMUSCULAR | Status: AC
  Administered 2014-04-17: 25 mg via INTRAVENOUS

## 2014-04-17 MED ORDER — SODIUM CHLORIDE 0.9 % IV SOLN
405.5000 mg | Freq: Once | INTRAVENOUS | Status: AC
  Administered 2014-04-17: 410 mg via INTRAVENOUS
  Filled 2014-04-17: qty 41

## 2014-04-17 NOTE — Patient Instructions (Signed)

## 2014-04-17 NOTE — Patient Instructions (Signed)
Carboplatin injection  What is this medicine?  CARBOPLATIN (KAR boe pla tin) is a chemotherapy drug. It targets fast dividing cells, like cancer cells, and causes these cells to die. This medicine is used to treat ovarian cancer and many other cancers.  This medicine may be used for other purposes; ask your health care provider or pharmacist if you have questions.  COMMON BRAND NAME(S): Paraplatin  What should I tell my health care provider before I take this medicine?  They need to know if you have any of these conditions:  -blood disorders  -hearing problems  -kidney disease  -recent or ongoing radiation therapy  -an unusual or allergic reaction to carboplatin, cisplatin, other chemotherapy, other medicines, foods, dyes, or preservatives  -pregnant or trying to get pregnant  -breast-feeding  How should I use this medicine?  This drug is usually given as an infusion into a vein. It is administered in a hospital or clinic by a specially trained health care professional.  Talk to your pediatrician regarding the use of this medicine in children. Special care may be needed.  Overdosage: If you think you have taken too much of this medicine contact a poison control center or emergency room at once.  NOTE: This medicine is only for you. Do not share this medicine with others.  What if I miss a dose?  It is important not to miss a dose. Call your doctor or health care professional if you are unable to keep an appointment.  What may interact with this medicine?  -medicines for seizures  -medicines to increase blood counts like filgrastim, pegfilgrastim, sargramostim  -some antibiotics like amikacin, gentamicin, neomycin, streptomycin, tobramycin  -vaccines  Talk to your doctor or health care professional before taking any of these medicines:  -acetaminophen  -aspirin  -ibuprofen  -ketoprofen  -naproxen  This list may not describe all possible interactions. Give your health care provider a list of all the medicines, herbs,  non-prescription drugs, or dietary supplements you use. Also tell them if you smoke, drink alcohol, or use illegal drugs. Some items may interact with your medicine.  What should I watch for while using this medicine?  Your condition will be monitored carefully while you are receiving this medicine. You will need important blood work done while you are taking this medicine.  This drug may make you feel generally unwell. This is not uncommon, as chemotherapy can affect healthy cells as well as cancer cells. Report any side effects. Continue your course of treatment even though you feel ill unless your doctor tells you to stop.  In some cases, you may be given additional medicines to help with side effects. Follow all directions for their use.  Call your doctor or health care professional for advice if you get a fever, chills or sore throat, or other symptoms of a cold or flu. Do not treat yourself. This drug decreases your body's ability to fight infections. Try to avoid being around people who are sick.  This medicine may increase your risk to bruise or bleed. Call your doctor or health care professional if you notice any unusual bleeding.  Be careful brushing and flossing your teeth or using a toothpick because you may get an infection or bleed more easily. If you have any dental work done, tell your dentist you are receiving this medicine.  Avoid taking products that contain aspirin, acetaminophen, ibuprofen, naproxen, or ketoprofen unless instructed by your doctor. These medicines may hide a fever.  Do not become   pregnant while taking this medicine. Women should inform their doctor if they wish to become pregnant or think they might be pregnant. There is a potential for serious side effects to an unborn child. Talk to your health care professional or pharmacist for more information. Do not breast-feed an infant while taking this medicine.  What side effects may I notice from receiving this medicine?  Side effects  that you should report to your doctor or health care professional as soon as possible:  -allergic reactions like skin rash, itching or hives, swelling of the face, lips, or tongue  -signs of infection - fever or chills, cough, sore throat, pain or difficulty passing urine  -signs of decreased platelets or bleeding - bruising, pinpoint red spots on the skin, black, tarry stools, nosebleeds  -signs of decreased red blood cells - unusually weak or tired, fainting spells, lightheadedness  -breathing problems  -changes in hearing  -changes in vision  -chest pain  -high blood pressure  -low blood counts - This drug may decrease the number of white blood cells, red blood cells and platelets. You may be at increased risk for infections and bleeding.  -nausea and vomiting  -pain, swelling, redness or irritation at the injection site  -pain, tingling, numbness in the hands or feet  -problems with balance, talking, walking  -trouble passing urine or change in the amount of urine  Side effects that usually do not require medical attention (report to your doctor or health care professional if they continue or are bothersome):  -hair loss  -loss of appetite  -metallic taste in the mouth or changes in taste  This list may not describe all possible side effects. Call your doctor for medical advice about side effects. You may report side effects to FDA at 1-800-FDA-1088.  Where should I keep my medicine?  This drug is given in a hospital or clinic and will not be stored at home.  NOTE: This sheet is a summary. It may not cover all possible information. If you have questions about this medicine, talk to your doctor, pharmacist, or health care provider.   2015, Elsevier/Gold Standard. (2007-11-09 14:38:05)  Paclitaxel injection  What is this medicine?  PACLITAXEL (PAK li TAX el) is a chemotherapy drug. It targets fast dividing cells, like cancer cells, and causes these cells to die. This medicine is used to treat ovarian cancer,  breast cancer, and other cancers.  This medicine may be used for other purposes; ask your health care provider or pharmacist if you have questions.  COMMON BRAND NAME(S): Onxol, Taxol  What should I tell my health care provider before I take this medicine?  They need to know if you have any of these conditions:  -blood disorders  -irregular heartbeat  -infection (especially a virus infection such as chickenpox, cold sores, or herpes)  -liver disease  -previous or ongoing radiation therapy  -an unusual or allergic reaction to paclitaxel, alcohol, polyoxyethylated castor oil, other chemotherapy agents, other medicines, foods, dyes, or preservatives  -pregnant or trying to get pregnant  -breast-feeding  How should I use this medicine?  This drug is given as an infusion into a vein. It is administered in a hospital or clinic by a specially trained health care professional.  Talk to your pediatrician regarding the use of this medicine in children. Special care may be needed.  Overdosage: If you think you have taken too much of this medicine contact a poison control center or emergency room at once.  NOTE:   This medicine is only for you. Do not share this medicine with others.  What if I miss a dose?  It is important not to miss your dose. Call your doctor or health care professional if you are unable to keep an appointment.  What may interact with this medicine?  Do not take this medicine with any of the following medications:  -disulfiram  -metronidazole  This medicine may also interact with the following medications:  -cyclosporine  -diazepam  -ketoconazole  -medicines to increase blood counts like filgrastim, pegfilgrastim, sargramostim  -other chemotherapy drugs like cisplatin, doxorubicin, epirubicin, etoposide, teniposide, vincristine  -quinidine  -testosterone  -vaccines  -verapamil  Talk to your doctor or health care professional before taking any of these  medicines:  -acetaminophen  -aspirin  -ibuprofen  -ketoprofen  -naproxen  This list may not describe all possible interactions. Give your health care provider a list of all the medicines, herbs, non-prescription drugs, or dietary supplements you use. Also tell them if you smoke, drink alcohol, or use illegal drugs. Some items may interact with your medicine.  What should I watch for while using this medicine?  Your condition will be monitored carefully while you are receiving this medicine. You will need important blood work done while you are taking this medicine.  This drug may make you feel generally unwell. This is not uncommon, as chemotherapy can affect healthy cells as well as cancer cells. Report any side effects. Continue your course of treatment even though you feel ill unless your doctor tells you to stop.  In some cases, you may be given additional medicines to help with side effects. Follow all directions for their use.  Call your doctor or health care professional for advice if you get a fever, chills or sore throat, or other symptoms of a cold or flu. Do not treat yourself. This drug decreases your body's ability to fight infections. Try to avoid being around people who are sick.  This medicine may increase your risk to bruise or bleed. Call your doctor or health care professional if you notice any unusual bleeding.  Be careful brushing and flossing your teeth or using a toothpick because you may get an infection or bleed more easily. If you have any dental work done, tell your dentist you are receiving this medicine.  Avoid taking products that contain aspirin, acetaminophen, ibuprofen, naproxen, or ketoprofen unless instructed by your doctor. These medicines may hide a fever.  Do not become pregnant while taking this medicine. Women should inform their doctor if they wish to become pregnant or think they might be pregnant. There is a potential for serious side effects to an unborn child. Talk to  your health care professional or pharmacist for more information. Do not breast-feed an infant while taking this medicine.  Men are advised not to father a child while receiving this medicine.  What side effects may I notice from receiving this medicine?  Side effects that you should report to your doctor or health care professional as soon as possible:  -allergic reactions like skin rash, itching or hives, swelling of the face, lips, or tongue  -low blood counts - This drug may decrease the number of white blood cells, red blood cells and platelets. You may be at increased risk for infections and bleeding.  -signs of infection - fever or chills, cough, sore throat, pain or difficulty passing urine  -signs of decreased platelets or bleeding - bruising, pinpoint red spots on the skin, black, tarry   stools, nosebleeds  -signs of decreased red blood cells - unusually weak or tired, fainting spells, lightheadedness  -breathing problems  -chest pain  -high or low blood pressure  -mouth sores  -nausea and vomiting  -pain, swelling, redness or irritation at the injection site  -pain, tingling, numbness in the hands or feet  -slow or irregular heartbeat  -swelling of the ankle, feet, hands  Side effects that usually do not require medical attention (report to your doctor or health care professional if they continue or are bothersome):  -bone pain  -complete hair loss including hair on your head, underarms, pubic hair, eyebrows, and eyelashes  -changes in the color of fingernails  -diarrhea  -loosening of the fingernails  -loss of appetite  -muscle or joint pain  -red flush to skin  -sweating  This list may not describe all possible side effects. Call your doctor for medical advice about side effects. You may report side effects to FDA at 1-800-FDA-1088.  Where should I keep my medicine?  This drug is given in a hospital or clinic and will not be stored at home.  NOTE: This sheet is a summary. It may not cover all possible  information. If you have questions about this medicine, talk to your doctor, pharmacist, or health care provider.   2015, Elsevier/Gold Standard. (2012-09-27 16:41:21)

## 2014-04-18 ENCOUNTER — Ambulatory Visit (HOSPITAL_BASED_OUTPATIENT_CLINIC_OR_DEPARTMENT_OTHER): Payer: Medicare Other

## 2014-04-18 ENCOUNTER — Telehealth: Payer: Self-pay | Admitting: *Deleted

## 2014-04-18 ENCOUNTER — Telehealth: Payer: Self-pay | Admitting: Gynecologic Oncology

## 2014-04-18 ENCOUNTER — Telehealth: Payer: Self-pay | Admitting: Oncology

## 2014-04-18 VITALS — BP 153/72 | HR 85 | Temp 98.4°F

## 2014-04-18 DIAGNOSIS — Z5189 Encounter for other specified aftercare: Secondary | ICD-10-CM

## 2014-04-18 DIAGNOSIS — C5702 Malignant neoplasm of left fallopian tube: Secondary | ICD-10-CM

## 2014-04-18 DIAGNOSIS — C5700 Malignant neoplasm of unspecified fallopian tube: Secondary | ICD-10-CM

## 2014-04-18 LAB — CA 125(PREVIOUS METHOD): CA 125: 5.4 U/mL (ref 0.0–30.2)

## 2014-04-18 LAB — CA 125: CA 125: 8 U/mL (ref <35)

## 2014-04-18 MED ORDER — TBO-FILGRASTIM 300 MCG/0.5ML ~~LOC~~ SOSY
300.0000 ug | PREFILLED_SYRINGE | Freq: Once | SUBCUTANEOUS | Status: AC
  Administered 2014-04-18: 300 ug via SUBCUTANEOUS
  Filled 2014-04-18: qty 0.5

## 2014-04-18 NOTE — Telephone Encounter (Signed)
pt called to see why 9/21 trmt not sch-adv will send email to MW to check trmt order.Adv to ask for an updated sch when she comes in today. Pt understood
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
## 2014-04-18 NOTE — Telephone Encounter (Signed)
Per staff message and POF I have scheduled appts. Advised scheduler of appts.Advised scheduler patient needs lab appt JMW

## 2014-04-18 NOTE — Progress Notes (Signed)
OFFICE PROGRESS NOTE   04/17/2014  Physicians:Brewster, Abigail Butts, MD, [name]; (PCP)   INTERVAL HISTORY:   Patient is seen, alone for visit, as she continues dose dense carboplatin taxol for IIIA left fallopian carcinoma, due day 1 cycle 3 today. Counts have maintained well with gCSF on days 2 and 9, this added after neutropenia with cycle 1. She is to see Dr Skeet Latch on 04-20-14.  Patient is very energetic from steroids day of treatments and the following day, then is tired for one day, then back to usual activities. She has had no nausea and no peripheral neuropathy. Bowels are moving regularly. 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   She has PAC, placed by IR on 04-04-14.  ONCOLOGIC HISTORY Patient had been in usual good health until acute abdominal pain for which she was seen in ED in [place] on 01-02-14 and admitted to [facility]. Per patient, CT scans were done, tho I do not have those reports; laboratory results also do not accompany (pre op CA125 not known). She was taken to optimal debulking by Dr Milana Kidney on 01-05-14 (hysterectomy, BSO, PPLND,omentectomy, pelvic peritonectomy and appendectomy. 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 No washings reported in this information. Pathologic stage [id number] (stage IIIA). She was discharged home on 01-11-14. She was doing well from the surgery when seen by Dr James Ivanoff on 01-18-14, and has contiued to recover from that without problems. She was not on home lovenox.   Start of treatment has been delayed as patient moved back to [place] after follow up visit with Dr Robley Fries on 01-18-14. She saw Dr Skeet Latch in consultation on 02-09-14, also with recommendation for adjuvant chemotherapy, which began with dose dense taxol carboplatin on 02-28-14. She was neutropenic by day 15 cycle 1, with that treatment delayed x 1 week and gCSF added.  Review of systems as above, also: No fever or symptoms of infection. No SOB, no LE swelling, no bleeding, no new or different pain. Husband's progressive dementia is stressful for her. Remainder of 10 point Review of Systems negative.  Objective:  Vital signs in last 24 hours:  BP 148/63  Pulse 85  Temp(Src) 98 F (36.7 C) (Oral)  Resp 24  Ht 5\' 4"  (1.626 m)  Wt 155 lb (70.308 kg)  BMI 26.59 kg/m2 Weight is up 2 lbs. Alert, oriented and appropriate. Ambulatory without difficulty.  Alopecia  HEENT:PERRL, sclerae not icteric. Oral mucosa moist without lesions, posterior pharynx clear.  Neck supple. No JVD.  Lymphatics:no cervical,suraclavicular, axillary or inguinal adenopathy Resp: clear to auscultation bilaterally and normal percussion bilaterally Cardio: regular rate and rhythm. No gallop. GI: soft, nontender, not distended, no mass or organomegaly. Normally active bowel sounds. Surgical incision not remarkable. Musculoskeletal/ Extremities: without pitting edema, cords, tenderness Neuro: no peripheral neuropathy. Otherwise nonfocal Skin without rash, ecchymosis, petechiae Portacath-without erythema or tenderness, good position  Lab Results:  Results for orders placed in visit on 04/17/14  CBC WITH DIFFERENTIAL      Result Value Ref Range   WBC 5.5  3.9 - 10.3 10e3/uL   NEUT# 5.0  1.5 - 6.5  10e3/uL   HGB 11.2 (*) 11.6 - 15.9 g/dL   HCT 33.4 (*) 34.8 - 46.6 %   Platelets 174  145 - 400 10e3/uL   MCV 92.0  79.5 - 101.0 fL   MCH 30.9  25.1 - 34.0 pg   MCHC 33.5  31.5 - 36.0 g/dL   RBC 3.63 (*) 3.70 -  5.45 10e6/uL   RDW 17.3 (*) 11.2 - 14.5 %   lymph# 0.5 (*) 0.9 - 3.3 10e3/uL   MONO# 0.0 (*) 0.1 - 0.9 10e3/uL   Eosinophils Absolute 0.0  0.0 - 0.5 10e3/uL   Basophils Absolute 0.0  0.0 - 0.1 10e3/uL   NEUT% 90.9 (*) 38.4 - 76.8 %   LYMPH% 8.5 (*) 14.0 - 49.7 %   MONO% 0.4  0.0 - 14.0 %   EOS% 0.0  0.0 - 7.0 %   BASO% 0.2  0.0 - 2.0 %  COMPREHENSIVE METABOLIC PANEL (WU98)      Result Value Ref Range   Sodium 139  136 - 145 mEq/L   Potassium 4.0  3.5 - 5.1 mEq/L   Chloride 104  98 - 109 mEq/L   CO2 23  22 - 29 mEq/L   Glucose 163 (*) 70 - 140 mg/dl   BUN 18.9  7.0 - 26.0 mg/dL   Creatinine 1.0  0.6 - 1.1 mg/dL   Total Bilirubin 0.49  0.20 - 1.20 mg/dL   Alkaline Phosphatase 52  40 - 150 U/L   AST 18  5 - 34 U/L   ALT 18  0 - 55 U/L   Total Protein 6.8  6.4 - 8.3 g/dL   Albumin 3.5  3.5 - 5.0 g/dL   Calcium 9.6  8.4 - 10.4 mg/dL   Anion Gap 11  3 - 11 mEq/L  CA 125      Result Value Ref Range   CA 125 8  <35 U/mL  CA 125(PREVIOUS METHOD)      Result Value Ref Range   CA 125 5.4  0.0 - 30.2 U/mL     Studies/Results:  No results found.  Medications: I have reviewed the patient's current medications.  DISCUSSION: continue treatment as planned. Keep appointment with Dr Skeet Latch on 04-20-14  Assessment/Plan:  1IIIA high grade serous carcinoma of left fallopian tube: post optimal debulking 01-05-14, day 1 cycle 1 dose dense carbo taxol given 02-28-14. Now using granix days 2 and 9.   2.steroid induced hyperglycemia: IVF with chemo is NS at 150 cc/hr with SS regular insulin coverage 3.PAC in 4.thickening left breast on PE with history of some finding on that side previously: will review recent mammogram information when that is received  5.elevated cholesterol  6..hypertension: appreciate adjustment in medications by Dr Mancel Bale  7.colon polyps: due colonoscopy fall 2015  8.social: has just moved back to [place] area after 17 years away. Husband with dementia.   Patient  understands discussion and is in agreement with plan. Chemo and gCSF orders confirmed Time spent 25 min including >50% counseling and coordination of care   Gordy Levan, MD

## 2014-04-19 ENCOUNTER — Telehealth: Payer: Self-pay | Admitting: Oncology

## 2014-04-19 NOTE — Telephone Encounter (Signed)
cld & left message of trmt time for 9/21

## 2014-04-20 ENCOUNTER — Ambulatory Visit: Payer: Medicare Other | Attending: Gynecologic Oncology | Admitting: Gynecologic Oncology

## 2014-04-20 ENCOUNTER — Encounter: Payer: Self-pay | Admitting: Gynecologic Oncology

## 2014-04-20 VITALS — BP 141/56 | HR 106 | Temp 98.5°F | Resp 20 | Ht 64.0 in | Wt 154.4 lb

## 2014-04-20 DIAGNOSIS — C5700 Malignant neoplasm of unspecified fallopian tube: Secondary | ICD-10-CM | POA: Insufficient documentation

## 2014-04-20 DIAGNOSIS — Z9079 Acquired absence of other genital organ(s): Secondary | ICD-10-CM | POA: Diagnosis not present

## 2014-04-20 DIAGNOSIS — Z90710 Acquired absence of both cervix and uterus: Secondary | ICD-10-CM | POA: Insufficient documentation

## 2014-04-20 DIAGNOSIS — Z8042 Family history of malignant neoplasm of prostate: Secondary | ICD-10-CM | POA: Diagnosis not present

## 2014-04-20 DIAGNOSIS — I10 Essential (primary) hypertension: Secondary | ICD-10-CM | POA: Insufficient documentation

## 2014-04-20 DIAGNOSIS — E7800 Pure hypercholesterolemia, unspecified: Secondary | ICD-10-CM | POA: Diagnosis not present

## 2014-04-20 DIAGNOSIS — Z8543 Personal history of malignant neoplasm of ovary: Secondary | ICD-10-CM | POA: Diagnosis not present

## 2014-04-20 NOTE — Patient Instructions (Signed)
Follow-up in three months Please discuss genetic counseling with your daughters.   Thank you very much [name] for allowing me to provide care for you today.  I appreciate your confidence in choosing our Gynecologic Oncology team.  If you have any questions about your visit today please call our office and we will get back to you as soon as possible.  Please consider using the website Medlineplus.gov as an Geneticist, molecular.   Francetta Found. Aime Carreras MD., PhD Gynecologic Oncology
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
## 2014-04-21 ENCOUNTER — Encounter: Payer: Self-pay | Admitting: Oncology

## 2014-04-25 ENCOUNTER — Other Ambulatory Visit (HOSPITAL_BASED_OUTPATIENT_CLINIC_OR_DEPARTMENT_OTHER): Payer: Medicare Other

## 2014-04-25 ENCOUNTER — Other Ambulatory Visit: Payer: Self-pay | Admitting: Oncology

## 2014-04-25 ENCOUNTER — Ambulatory Visit: Payer: Medicare Other

## 2014-04-25 ENCOUNTER — Ambulatory Visit (HOSPITAL_BASED_OUTPATIENT_CLINIC_OR_DEPARTMENT_OTHER): Payer: Medicare Other

## 2014-04-25 VITALS — BP 157/73 | HR 88 | Temp 98.2°F

## 2014-04-25 DIAGNOSIS — Z5111 Encounter for antineoplastic chemotherapy: Secondary | ICD-10-CM

## 2014-04-25 DIAGNOSIS — C5700 Malignant neoplasm of unspecified fallopian tube: Secondary | ICD-10-CM

## 2014-04-25 DIAGNOSIS — C5702 Malignant neoplasm of left fallopian tube: Secondary | ICD-10-CM

## 2014-04-25 DIAGNOSIS — Z95828 Presence of other vascular implants and grafts: Secondary | ICD-10-CM

## 2014-04-25 DIAGNOSIS — R7309 Other abnormal glucose: Secondary | ICD-10-CM

## 2014-04-25 LAB — CBC WITH DIFFERENTIAL/PLATELET
BASO%: 0.2 % (ref 0.0–2.0)
BASOS ABS: 0 10*3/uL (ref 0.0–0.1)
EOS%: 0 % (ref 0.0–7.0)
Eosinophils Absolute: 0 10*3/uL (ref 0.0–0.5)
HCT: 30.8 % — ABNORMAL LOW (ref 34.8–46.6)
HGB: 10.2 g/dL — ABNORMAL LOW (ref 11.6–15.9)
LYMPH%: 15.9 % (ref 14.0–49.7)
MCH: 31.2 pg (ref 25.1–34.0)
MCHC: 33.1 g/dL (ref 31.5–36.0)
MCV: 94.4 fL (ref 79.5–101.0)
MONO#: 0 10*3/uL — ABNORMAL LOW (ref 0.1–0.9)
MONO%: 1.4 % (ref 0.0–14.0)
NEUT#: 2.8 10*3/uL (ref 1.5–6.5)
NEUT%: 82.5 % — ABNORMAL HIGH (ref 38.4–76.8)
Platelets: 215 10*3/uL (ref 145–400)
RBC: 3.26 10*6/uL — AB (ref 3.70–5.45)
RDW: 19 % — ABNORMAL HIGH (ref 11.2–14.5)
WBC: 3.4 10*3/uL — ABNORMAL LOW (ref 3.9–10.3)
lymph#: 0.5 10*3/uL — ABNORMAL LOW (ref 0.9–3.3)

## 2014-04-25 LAB — COMPREHENSIVE METABOLIC PANEL (CC13)
ALBUMIN: 3.5 g/dL (ref 3.5–5.0)
ALT: 12 U/L (ref 0–55)
AST: 16 U/L (ref 5–34)
Alkaline Phosphatase: 51 U/L (ref 40–150)
Anion Gap: 10 mEq/L (ref 3–11)
BUN: 28.2 mg/dL — AB (ref 7.0–26.0)
CO2: 23 mEq/L (ref 22–29)
Calcium: 9.3 mg/dL (ref 8.4–10.4)
Chloride: 104 mEq/L (ref 98–109)
Creatinine: 1 mg/dL (ref 0.6–1.1)
Glucose: 271 mg/dl — ABNORMAL HIGH (ref 70–140)
Potassium: 3.9 mEq/L (ref 3.5–5.1)
SODIUM: 137 meq/L (ref 136–145)
Total Bilirubin: 0.29 mg/dL (ref 0.20–1.20)
Total Protein: 6.6 g/dL (ref 6.4–8.3)

## 2014-04-25 MED ORDER — SODIUM CHLORIDE 0.9 % IV SOLN
Freq: Once | INTRAVENOUS | Status: AC
  Administered 2014-04-25: 12:00:00 via INTRAVENOUS

## 2014-04-25 MED ORDER — FAMOTIDINE IN NACL 20-0.9 MG/50ML-% IV SOLN
20.0000 mg | Freq: Once | INTRAVENOUS | Status: AC
  Administered 2014-04-25: 20 mg via INTRAVENOUS

## 2014-04-25 MED ORDER — ONDANSETRON 8 MG/50ML IVPB (CHCC)
8.0000 mg | Freq: Once | INTRAVENOUS | Status: AC
  Administered 2014-04-25: 8 mg via INTRAVENOUS

## 2014-04-25 MED ORDER — DEXAMETHASONE SODIUM PHOSPHATE 20 MG/5ML IJ SOLN
20.0000 mg | Freq: Once | INTRAMUSCULAR | Status: AC
  Administered 2014-04-25: 20 mg via INTRAVENOUS

## 2014-04-25 MED ORDER — SODIUM CHLORIDE 0.9 % IJ SOLN
10.0000 mL | INTRAMUSCULAR | Status: DC | PRN
  Administered 2014-04-25: 10 mL via INTRAVENOUS
  Filled 2014-04-25: qty 10

## 2014-04-25 MED ORDER — DIPHENHYDRAMINE HCL 50 MG/ML IJ SOLN
INTRAMUSCULAR | Status: AC
  Filled 2014-04-25: qty 1

## 2014-04-25 MED ORDER — HEPARIN SOD (PORK) LOCK FLUSH 100 UNIT/ML IV SOLN
500.0000 [IU] | Freq: Once | INTRAVENOUS | Status: AC | PRN
  Administered 2014-04-25: 500 [IU]
  Filled 2014-04-25: qty 5

## 2014-04-25 MED ORDER — DEXAMETHASONE SODIUM PHOSPHATE 20 MG/5ML IJ SOLN
INTRAMUSCULAR | Status: AC
  Filled 2014-04-25: qty 5

## 2014-04-25 MED ORDER — SODIUM CHLORIDE 0.9 % IJ SOLN
10.0000 mL | INTRAMUSCULAR | Status: DC | PRN
  Administered 2014-04-25: 10 mL
  Filled 2014-04-25: qty 10

## 2014-04-25 MED ORDER — ONDANSETRON 8 MG/NS 50 ML IVPB
INTRAVENOUS | Status: AC
  Filled 2014-04-25: qty 8

## 2014-04-25 MED ORDER — DEXTROSE 5 % IV SOLN
80.0000 mg/m2 | Freq: Once | INTRAVENOUS | Status: AC
  Administered 2014-04-25: 138 mg via INTRAVENOUS
  Filled 2014-04-25: qty 23

## 2014-04-25 MED ORDER — ONDANSETRON 16 MG/50ML IVPB (CHCC)
INTRAVENOUS | Status: AC
  Filled 2014-04-25: qty 16

## 2014-04-25 MED ORDER — INSULIN REGULAR HUMAN 100 UNIT/ML IJ SOLN
2.0000 [IU] | Freq: Once | INTRAMUSCULAR | Status: AC | PRN
  Administered 2014-04-25: 2 [IU] via SUBCUTANEOUS
  Filled 2014-04-25: qty 0.02

## 2014-04-25 MED ORDER — DIPHENHYDRAMINE HCL 50 MG/ML IJ SOLN
25.0000 mg | Freq: Once | INTRAMUSCULAR | Status: AC
  Administered 2014-04-25: 25 mg via INTRAVENOUS

## 2014-04-25 MED ORDER — FAMOTIDINE IN NACL 20-0.9 MG/50ML-% IV SOLN
INTRAVENOUS | Status: AC
  Filled 2014-04-25: qty 50

## 2014-04-25 NOTE — Patient Instructions (Signed)

## 2014-04-25 NOTE — Patient Instructions (Addendum)
[facility] Discharge Instructions for Patients Receiving Chemotherapy  Today you received the following chemotherapy agents taxol  To help prevent nausea and vomiting after your treatment, we encourage you to take your nausea medication zofran 8mg  every 8 hours as needed.   If you develop nausea and vomiting that is not controlled by your nausea medication, call the clinic.   BELOW ARE SYMPTOMS THAT SHOULD BE REPORTED IMMEDIATELY:  *FEVER GREATER THAN 100.5 F  *CHILLS WITH OR WITHOUT FEVER  NAUSEA AND VOMITING THAT IS NOT CONTROLLED WITH YOUR NAUSEA MEDICATION  *UNUSUAL SHORTNESS OF BREATH  *UNUSUAL BRUISING OR BLEEDING  TENDERNESS IN MOUTH AND THROAT WITH OR WITHOUT PRESENCE OF ULCERS  *URINARY PROBLEMS  *BOWEL PROBLEMS  UNUSUAL RASH Items with * indicate a potential emergency and should be followed up as soon as possible.  Feel free to call the clinic you have any questions or concerns. The clinic phone number is [phone].

## 2014-04-26 ENCOUNTER — Ambulatory Visit (HOSPITAL_BASED_OUTPATIENT_CLINIC_OR_DEPARTMENT_OTHER): Payer: Medicare Other

## 2014-04-26 ENCOUNTER — Telehealth: Payer: Self-pay | Admitting: Oncology

## 2014-04-26 ENCOUNTER — Other Ambulatory Visit: Payer: Medicare Other

## 2014-04-26 VITALS — BP 139/61 | HR 86 | Temp 98.0°F

## 2014-04-26 DIAGNOSIS — C5700 Malignant neoplasm of unspecified fallopian tube: Secondary | ICD-10-CM

## 2014-04-26 DIAGNOSIS — C5702 Malignant neoplasm of left fallopian tube: Secondary | ICD-10-CM

## 2014-04-26 DIAGNOSIS — Z5189 Encounter for other specified aftercare: Secondary | ICD-10-CM

## 2014-04-26 MED ORDER — TBO-FILGRASTIM 300 MCG/0.5ML ~~LOC~~ SOSY
300.0000 ug | PREFILLED_SYRINGE | Freq: Once | SUBCUTANEOUS | Status: AC
  Administered 2014-04-26: 300 ug via SUBCUTANEOUS
  Filled 2014-04-26: qty 0.5

## 2014-04-26 NOTE — Telephone Encounter (Signed)
cld pt to give pt new inj times-pt understood-disregard prev note

## 2014-04-26 NOTE — Telephone Encounter (Signed)
per pof to sch pt appt-sent MW email to sch pt trmt-pt has lab time and will get updated appt 9/14 appt-adv WL wil call to adv C

## 2014-05-01 ENCOUNTER — Ambulatory Visit (HOSPITAL_BASED_OUTPATIENT_CLINIC_OR_DEPARTMENT_OTHER): Payer: Medicare Other

## 2014-05-01 ENCOUNTER — Other Ambulatory Visit (HOSPITAL_BASED_OUTPATIENT_CLINIC_OR_DEPARTMENT_OTHER): Payer: Medicare Other

## 2014-05-01 ENCOUNTER — Encounter: Payer: Self-pay | Admitting: Oncology

## 2014-05-01 ENCOUNTER — Ambulatory Visit: Payer: Medicare Other

## 2014-05-01 ENCOUNTER — Ambulatory Visit (HOSPITAL_BASED_OUTPATIENT_CLINIC_OR_DEPARTMENT_OTHER): Payer: Medicare Other | Admitting: Oncology

## 2014-05-01 VITALS — BP 149/61 | HR 96 | Temp 98.1°F | Resp 22 | Ht 64.0 in | Wt 156.2 lb

## 2014-05-01 DIAGNOSIS — C5702 Malignant neoplasm of left fallopian tube: Secondary | ICD-10-CM

## 2014-05-01 DIAGNOSIS — R7309 Other abnormal glucose: Secondary | ICD-10-CM

## 2014-05-01 DIAGNOSIS — C5700 Malignant neoplasm of unspecified fallopian tube: Secondary | ICD-10-CM

## 2014-05-01 DIAGNOSIS — Z5111 Encounter for antineoplastic chemotherapy: Secondary | ICD-10-CM

## 2014-05-01 DIAGNOSIS — Z95828 Presence of other vascular implants and grafts: Secondary | ICD-10-CM

## 2014-05-01 LAB — CBC WITH DIFFERENTIAL/PLATELET
BASO%: 0.2 % (ref 0.0–2.0)
Basophils Absolute: 0 10*3/uL (ref 0.0–0.1)
EOS%: 0 % (ref 0.0–7.0)
Eosinophils Absolute: 0 10*3/uL (ref 0.0–0.5)
HCT: 30.8 % — ABNORMAL LOW (ref 34.8–46.6)
HGB: 10.3 g/dL — ABNORMAL LOW (ref 11.6–15.9)
LYMPH#: 0.5 10*3/uL — AB (ref 0.9–3.3)
LYMPH%: 13 % — ABNORMAL LOW (ref 14.0–49.7)
MCH: 32.1 pg (ref 25.1–34.0)
MCHC: 33.5 g/dL (ref 31.5–36.0)
MCV: 95.7 fL (ref 79.5–101.0)
MONO#: 0 10*3/uL — ABNORMAL LOW (ref 0.1–0.9)
MONO%: 0.6 % (ref 0.0–14.0)
NEUT#: 3.4 10*3/uL (ref 1.5–6.5)
NEUT%: 86.2 % — ABNORMAL HIGH (ref 38.4–76.8)
Platelets: 253 10*3/uL (ref 145–400)
RBC: 3.22 10*6/uL — ABNORMAL LOW (ref 3.70–5.45)
RDW: 20.9 % — AB (ref 11.2–14.5)
WBC: 4 10*3/uL (ref 3.9–10.3)

## 2014-05-01 LAB — COMPREHENSIVE METABOLIC PANEL (CC13)
ALBUMIN: 3.6 g/dL (ref 3.5–5.0)
ALT: 13 U/L (ref 0–55)
AST: 16 U/L (ref 5–34)
Alkaline Phosphatase: 58 U/L (ref 40–150)
Anion Gap: 15 mEq/L — ABNORMAL HIGH (ref 3–11)
BUN: 20.4 mg/dL (ref 7.0–26.0)
CHLORIDE: 105 meq/L (ref 98–109)
CO2: 20 mEq/L — ABNORMAL LOW (ref 22–29)
Calcium: 9.6 mg/dL (ref 8.4–10.4)
Creatinine: 1.1 mg/dL (ref 0.6–1.1)
Glucose: 267 mg/dl — ABNORMAL HIGH (ref 70–140)
POTASSIUM: 3.8 meq/L (ref 3.5–5.1)
Sodium: 140 mEq/L (ref 136–145)
Total Bilirubin: 0.33 mg/dL (ref 0.20–1.20)
Total Protein: 6.7 g/dL (ref 6.4–8.3)

## 2014-05-01 MED ORDER — SODIUM CHLORIDE 0.9 % IJ SOLN
10.0000 mL | INTRAMUSCULAR | Status: DC | PRN
  Administered 2014-05-01: 10 mL
  Filled 2014-05-01: qty 10

## 2014-05-01 MED ORDER — SODIUM CHLORIDE 0.9 % IV SOLN
Freq: Once | INTRAVENOUS | Status: AC
  Administered 2014-05-01: 11:00:00 via INTRAVENOUS

## 2014-05-01 MED ORDER — INSULIN REGULAR HUMAN 100 UNIT/ML IJ SOLN
2.0000 [IU] | Freq: Once | INTRAMUSCULAR | Status: AC | PRN
  Administered 2014-05-01: 2 [IU] via SUBCUTANEOUS
  Filled 2014-05-01: qty 0.02

## 2014-05-01 MED ORDER — HEPARIN SOD (PORK) LOCK FLUSH 100 UNIT/ML IV SOLN
500.0000 [IU] | Freq: Once | INTRAVENOUS | Status: AC | PRN
  Administered 2014-05-01: 500 [IU]
  Filled 2014-05-01: qty 5

## 2014-05-01 MED ORDER — ONDANSETRON 8 MG/NS 50 ML IVPB
INTRAVENOUS | Status: AC
  Filled 2014-05-01: qty 8

## 2014-05-01 MED ORDER — DEXAMETHASONE SODIUM PHOSPHATE 20 MG/5ML IJ SOLN
INTRAMUSCULAR | Status: AC
  Filled 2014-05-01: qty 5

## 2014-05-01 MED ORDER — DEXAMETHASONE 4 MG PO TABS: ORAL_TABLET | ORAL | Status: DC

## 2014-05-01 MED ORDER — DEXAMETHASONE SODIUM PHOSPHATE 20 MG/5ML IJ SOLN
20.0000 mg | Freq: Once | INTRAMUSCULAR | Status: AC
  Administered 2014-05-01: 20 mg via INTRAVENOUS

## 2014-05-01 MED ORDER — FAMOTIDINE IN NACL 20-0.9 MG/50ML-% IV SOLN
INTRAVENOUS | Status: AC
  Filled 2014-05-01: qty 50

## 2014-05-01 MED ORDER — FAMOTIDINE IN NACL 20-0.9 MG/50ML-% IV SOLN
20.0000 mg | Freq: Once | INTRAVENOUS | Status: AC
  Administered 2014-05-01: 20 mg via INTRAVENOUS

## 2014-05-01 MED ORDER — DIPHENHYDRAMINE HCL 50 MG/ML IJ SOLN
INTRAMUSCULAR | Status: AC
  Filled 2014-05-01: qty 1

## 2014-05-01 MED ORDER — DIPHENHYDRAMINE HCL 50 MG/ML IJ SOLN
25.0000 mg | Freq: Once | INTRAMUSCULAR | Status: AC
  Administered 2014-05-01: 25 mg via INTRAVENOUS

## 2014-05-01 MED ORDER — SODIUM CHLORIDE 0.9 % IJ SOLN
10.0000 mL | INTRAMUSCULAR | Status: DC | PRN
  Administered 2014-05-01: 10 mL via INTRAVENOUS
  Filled 2014-05-01: qty 10

## 2014-05-01 MED ORDER — PACLITAXEL CHEMO INJECTION 300 MG/50ML
80.0000 mg/m2 | Freq: Once | INTRAVENOUS | Status: AC
  Administered 2014-05-01: 138 mg via INTRAVENOUS
  Filled 2014-05-01: qty 23

## 2014-05-01 MED ORDER — ONDANSETRON 8 MG/50ML IVPB (CHCC)
8.0000 mg | Freq: Once | INTRAVENOUS | Status: AC
  Administered 2014-05-01: 8 mg via INTRAVENOUS

## 2014-05-01 NOTE — Patient Instructions (Signed)
[facility] Discharge Instructions for Patients Receiving Chemotherapy  Today you received the following chemotherapy agents Taxol.  To help prevent nausea and vomiting after your treatment, we encourage you to take your nausea medication Zofran 8 mg and ativan 0.5 mg as ordered by providers.   If you develop nausea and vomiting that is not controlled by your nausea medication, call the clinic.   BELOW ARE SYMPTOMS THAT SHOULD BE REPORTED IMMEDIATELY:  *FEVER GREATER THAN 100.5 F  *CHILLS WITH OR WITHOUT FEVER  NAUSEA AND VOMITING THAT IS NOT CONTROLLED WITH YOUR NAUSEA MEDICATION  *UNUSUAL SHORTNESS OF BREATH  *UNUSUAL BRUISING OR BLEEDING  TENDERNESS IN MOUTH AND THROAT WITH OR WITHOUT PRESENCE OF ULCERS  *URINARY PROBLEMS  *BOWEL PROBLEMS  UNUSUAL RASH Items with * indicate a potential emergency and should be followed up as soon as possible.  Feel free to call the clinic you have any questions or concerns. The clinic phone number is [phone].

## 2014-05-01 NOTE — Progress Notes (Signed)
OFFICE PROGRESS NOTE   05/01/2014   Physicians:Brewster, Abigail Butts, MD, [name]; (PCP)   INTERVAL HISTORY:  Patient is seen, alone for visit, continuing adjuvant chemotherapy with dose dense carboplatin taxol for IIIA left fallopian carcinoma, due day 15 cycle 3 today. [place] has maintained well this cycle with neupogen/ granix 300 mcg on days 2 and 9. She saw Dr Skeet Latch on 04-20-14, with no concerns identified and recommendation to continue treatment, and will see her again in early Dec.   Patient continues to tolerate the chemotherapy very well, with no nausea and no peripheral neuropathy. She is energetic from premed steroids day of chemo and day after, then fatigued but still functional the following day. Bowels do not move well for 2-3 days after each chemo, despite increase in senokot, then are fine. PAC is functioning well. No significant aches with this taxol dose or gCSF.    She has PAC. She need flu vaccine on day that she is not receiving chemotherapy, here or outside facility fine.  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  ONCOLOGIC HISTORY Patient had been in usual good health until acute abdominal pain for which she was seen in ED in [place] on 01-02-14 and admitted to [facility]. Per patient, CT scans were done, tho I do not have those reports; laboratory results also do not accompany (pre op CA125 not known). She was taken to optimal debulking by Dr Milana Kidney on 01-05-14 (hysterectomy, BSO, PPLND,omentectomy, pelvic peritonectomy and appendectomy. 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 No washings reported in this information. Pathologic stage pT3aN0 (stage IIIA). She was discharged home on 01-11-14. She was doing well from the surgery when seen by Dr James Ivanoff on 01-18-14, and has contiued to recover from that without problems. She was not on home lovenox.  Start of treatment has been delayed as patient moved back to [place] after follow up visit with Dr Robley Fries on 01-18-14. She saw Dr Skeet Latch in consultation on 02-09-14, also with recommendation for adjuvant chemotherapy, which began with dose dense taxol carboplatin on 02-28-14. She was neutropenic by day 15 cycle 1, with that treatment delayed x 1 week and gCSF added.  Review of systems as above, also: No fever or symptoms of infection. Chronic skin changes in sun exposed areas seem to be improving after initial erythema there related to chemo. No bleeding. No LE swelling. No abdominal or pelvic pain. Remainder of 10 point Review of Systems negative.  Objective:  Vital signs in last 24 hours:  BP 149/61  Pulse 96  Temp(Src) 98.1 F (36.7 C) (Oral)  Resp 22  Ht 5\' 4"  (1.626 m)  Wt 156 lb 3.2 oz (70.852 kg)  BMI 26.80 kg/m2 Weight is up 2 lbs. Alert, oriented and appropriate. Ambulatory without difficulty.   HEENT:PERRL, sclerae not icteric. Oral mucosa moist without lesions, posterior pharynx clear.  Neck supple. No JVD.  Lymphatics:no cervical,suraclavicular or inguinal adenopathy Resp: clear to auscultation bilaterally and normal percussion bilaterally Cardio: regular rate and rhythm. No gallop. GI: soft, nontender,  not distended, no mass or organomegaly. Normally active bowel sounds. Surgical incision not remarkable. Musculoskeletal/ Extremities: without pitting edema, cords, tenderness Neuro: no peripheral neuropathy. Otherwise nonfocal. PSYCH normal  mood and affect Skin areas of probable sun damage/ precancerous changes dorsal forearms and hands especially with slight erythema, otherwise without rash, ecchymosis, petechiae Portacath-without erythema or tenderness  Lab Results:  Results for orders placed in visit on 05/01/14  CBC WITH DIFFERENTIAL      Result Value Ref Range   WBC 4.0  3.9 - 10.3 10e3/uL   NEUT# 3.4  1.5 - 6.5 10e3/uL   HGB 10.3 (*) 11.6 - 15.9 g/dL   HCT 30.8 (*) 34.8 - 46.6 %   Platelets 253  145 - 400 10e3/uL   MCV 95.7  79.5 - 101.0 fL   MCH 32.1  25.1 - 34.0 pg   MCHC 33.5  31.5 - 36.0 g/dL   RBC 3.22 (*) 3.70 - 5.45 10e6/uL   RDW 20.9 (*) 11.2 - 14.5 %   lymph# 0.5 (*) 0.9 - 3.3 10e3/uL   MONO# 0.0 (*) 0.1 - 0.9 10e3/uL   Eosinophils Absolute 0.0  0.0 - 0.5 10e3/uL   Basophils Absolute 0.0  0.0 - 0.1 10e3/uL   NEUT% 86.2 (*) 38.4 - 76.8 %   LYMPH% 13.0 (*) 14.0 - 49.7 %   MONO% 0.6  0.0 - 14.0 %   EOS% 0.0  0.0 - 7.0 %   BASO% 0.2  0.0 - 2.0 %  COMPREHENSIVE METABOLIC PANEL (GE95)      Result Value Ref Range   Sodium 140  136 - 145 mEq/L   Potassium 3.8  3.5 - 5.1 mEq/L   Chloride 105  98 - 109 mEq/L   CO2 20 (*) 22 - 29 mEq/L   Glucose 267 (*) 70 - 140 mg/dl   BUN 20.4  7.0 - 26.0 mg/dL   Creatinine 1.1  0.6 - 1.1 mg/dL   Total Bilirubin 0.33  0.20 - 1.20 mg/dL   Alkaline Phosphatase 58  40 - 150 U/L   AST 16  5 - 34 U/L   ALT 13  0 - 55 U/L   Total Protein 6.7  6.4 - 8.3 g/dL   Albumin 3.6  3.5 - 5.0 g/dL   Calcium 9.6  8.4 - 10.4 mg/dL   Anion Gap 15 (*) 3 - 11 mEq/L     Studies/Results:  No results found.  Medications: I have reviewed the patient's current medications.  DISCUSSION: patient would like to go back to [place] to look for condo for herself and husband, which she thinks she could do between chemo treatments. We discussed logistics of transferring chemotherapy to [place] just for 1-2 treatments, or consideration of changing carbo and taxol to q 3 week regimen. At completion  of discussion, as she is really doing very well with present regimen, she prefers to continue as planned here and she will work any [place] trip around the treatments.  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 Now using granix days 2 and 9. She will have day 15 cycle 3 today. Continue treatment, planning 6 cycles. I will see her at least 9-18. Labs weekly. 2.steroid induced hyperglycemia: IVF with chemo is NS at 150 cc/hr with SS regular insulin coverage  3.PAC in  4.thickening left breast on PE with history of some finding on that side previously: last  mammograms in [place] and I do not yet see that report in this EMR 5.elevated cholesterol  6..hypertension: appreciate adjustment in medications by Dr Mancel Bale  7.colon polyps: due colonoscopy fall 2015  8.social: has just moved back to [place] area after 17 years away. Husband with dementia.   All questions answered. Chemo and gCSF orders confirmed. Time spent 25 min including >50% counseling and coordination of care.    [place] Nunley P, MD   05/01/2014, 10:38 AM

## 2014-05-01 NOTE — Patient Instructions (Signed)

## 2014-05-02 ENCOUNTER — Telehealth: Payer: Self-pay | Admitting: *Deleted

## 2014-05-02 ENCOUNTER — Telehealth: Payer: Self-pay | Admitting: Oncology

## 2014-05-02 NOTE — Telephone Encounter (Signed)
per pof to sch pt appt-sent MW emailt o sch trmt-pt to get updated appt on next visit

## 2014-05-02 NOTE — Telephone Encounter (Signed)
Per staff message and POF I have scheduled appts. Advised scheduler of appts. JMW  

## 2014-05-08 ENCOUNTER — Ambulatory Visit: Payer: Medicare Other

## 2014-05-08 ENCOUNTER — Ambulatory Visit (HOSPITAL_BASED_OUTPATIENT_CLINIC_OR_DEPARTMENT_OTHER): Payer: Medicare Other

## 2014-05-08 ENCOUNTER — Other Ambulatory Visit (HOSPITAL_BASED_OUTPATIENT_CLINIC_OR_DEPARTMENT_OTHER): Payer: Medicare Other

## 2014-05-08 VITALS — BP 157/67 | HR 89 | Temp 98.2°F | Resp 19

## 2014-05-08 DIAGNOSIS — C5702 Malignant neoplasm of left fallopian tube: Secondary | ICD-10-CM

## 2014-05-08 DIAGNOSIS — C5700 Malignant neoplasm of unspecified fallopian tube: Secondary | ICD-10-CM

## 2014-05-08 DIAGNOSIS — Z95828 Presence of other vascular implants and grafts: Secondary | ICD-10-CM

## 2014-05-08 DIAGNOSIS — Z5111 Encounter for antineoplastic chemotherapy: Secondary | ICD-10-CM

## 2014-05-08 LAB — COMPREHENSIVE METABOLIC PANEL (CC13)
ALT: 15 U/L (ref 0–55)
ANION GAP: 13 meq/L — AB (ref 3–11)
AST: 18 U/L (ref 5–34)
Albumin: 3.6 g/dL (ref 3.5–5.0)
Alkaline Phosphatase: 56 U/L (ref 40–150)
BUN: 21.8 mg/dL (ref 7.0–26.0)
CO2: 22 meq/L (ref 22–29)
Calcium: 9.8 mg/dL (ref 8.4–10.4)
Chloride: 105 mEq/L (ref 98–109)
Creatinine: 0.9 mg/dL (ref 0.6–1.1)
GLUCOSE: 175 mg/dL — AB (ref 70–140)
Potassium: 4 mEq/L (ref 3.5–5.1)
SODIUM: 141 meq/L (ref 136–145)
TOTAL PROTEIN: 7 g/dL (ref 6.4–8.3)
Total Bilirubin: 0.36 mg/dL (ref 0.20–1.20)

## 2014-05-08 LAB — CBC WITH DIFFERENTIAL/PLATELET
BASO%: 0.3 % (ref 0.0–2.0)
Basophils Absolute: 0 10*3/uL (ref 0.0–0.1)
EOS ABS: 0 10*3/uL (ref 0.0–0.5)
EOS%: 0.1 % (ref 0.0–7.0)
HCT: 30.7 % — ABNORMAL LOW (ref 34.8–46.6)
HGB: 10.3 g/dL — ABNORMAL LOW (ref 11.6–15.9)
LYMPH%: 18.1 % (ref 14.0–49.7)
MCH: 32.2 pg (ref 25.1–34.0)
MCHC: 33.4 g/dL (ref 31.5–36.0)
MCV: 96.5 fL (ref 79.5–101.0)
MONO#: 0 10*3/uL — AB (ref 0.1–0.9)
MONO%: 1.1 % (ref 0.0–14.0)
NEUT%: 80.4 % — ABNORMAL HIGH (ref 38.4–76.8)
NEUTROS ABS: 3.4 10*3/uL (ref 1.5–6.5)
Platelets: 217 10*3/uL (ref 145–400)
RBC: 3.18 10*6/uL — AB (ref 3.70–5.45)
RDW: 21.2 % — AB (ref 11.2–14.5)
WBC: 4.2 10*3/uL (ref 3.9–10.3)
lymph#: 0.8 10*3/uL — ABNORMAL LOW (ref 0.9–3.3)

## 2014-05-08 MED ORDER — DEXTROSE 5 % IV SOLN
80.0000 mg/m2 | Freq: Once | INTRAVENOUS | Status: AC
  Administered 2014-05-08: 138 mg via INTRAVENOUS
  Filled 2014-05-08: qty 23

## 2014-05-08 MED ORDER — DEXAMETHASONE SODIUM PHOSPHATE 20 MG/5ML IJ SOLN
INTRAMUSCULAR | Status: AC
  Filled 2014-05-08: qty 5

## 2014-05-08 MED ORDER — CARBOPLATIN CHEMO INJECTION 600 MG/60ML
405.5000 mg | Freq: Once | INTRAVENOUS | Status: AC
  Administered 2014-05-08: 410 mg via INTRAVENOUS
  Filled 2014-05-08: qty 41

## 2014-05-08 MED ORDER — FAMOTIDINE IN NACL 20-0.9 MG/50ML-% IV SOLN
20.0000 mg | Freq: Once | INTRAVENOUS | Status: AC
  Administered 2014-05-08: 20 mg via INTRAVENOUS

## 2014-05-08 MED ORDER — INSULIN REGULAR HUMAN 100 UNIT/ML IJ SOLN: 2.0000 [IU] | Freq: Once | INTRAMUSCULAR | Status: DC | PRN

## 2014-05-08 MED ORDER — DIPHENHYDRAMINE HCL 50 MG/ML IJ SOLN
INTRAMUSCULAR | Status: AC
  Filled 2014-05-08: qty 1

## 2014-05-08 MED ORDER — SODIUM CHLORIDE 0.9 % IJ SOLN
10.0000 mL | INTRAMUSCULAR | Status: DC | PRN
  Administered 2014-05-08: 10 mL
  Filled 2014-05-08: qty 10

## 2014-05-08 MED ORDER — HEPARIN SOD (PORK) LOCK FLUSH 100 UNIT/ML IV SOLN
500.0000 [IU] | Freq: Once | INTRAVENOUS | Status: AC | PRN
  Administered 2014-05-08: 500 [IU]
  Filled 2014-05-08: qty 5

## 2014-05-08 MED ORDER — ONDANSETRON 16 MG/50ML IVPB (CHCC)
16.0000 mg | Freq: Once | INTRAVENOUS | Status: AC
  Administered 2014-05-08: 16 mg via INTRAVENOUS

## 2014-05-08 MED ORDER — SODIUM CHLORIDE 0.9 % IJ SOLN
10.0000 mL | INTRAMUSCULAR | Status: DC | PRN
  Administered 2014-05-08: 10 mL via INTRAVENOUS
  Filled 2014-05-08: qty 10

## 2014-05-08 MED ORDER — DEXAMETHASONE SODIUM PHOSPHATE 20 MG/5ML IJ SOLN
20.0000 mg | Freq: Once | INTRAMUSCULAR | Status: AC
  Administered 2014-05-08: 20 mg via INTRAVENOUS

## 2014-05-08 MED ORDER — ONDANSETRON 16 MG/50ML IVPB (CHCC)
INTRAVENOUS | Status: AC
  Filled 2014-05-08: qty 16

## 2014-05-08 MED ORDER — DIPHENHYDRAMINE HCL 50 MG/ML IJ SOLN
25.0000 mg | Freq: Once | INTRAMUSCULAR | Status: AC
  Administered 2014-05-08: 25 mg via INTRAVENOUS

## 2014-05-08 MED ORDER — FAMOTIDINE IN NACL 20-0.9 MG/50ML-% IV SOLN
INTRAVENOUS | Status: AC
  Filled 2014-05-08: qty 50

## 2014-05-08 MED ORDER — SODIUM CHLORIDE 0.9 % IV SOLN
Freq: Once | INTRAVENOUS | Status: AC
  Administered 2014-05-08: 10:00:00 via INTRAVENOUS

## 2014-05-08 NOTE — Patient Instructions (Signed)
[facility] Discharge Instructions for Patients Receiving Chemotherapy  Today you received the following chemotherapy agents: Taxol, Carboplatin   To help prevent nausea and vomiting after your treatment, we encourage you to take your nausea medication as prescribed by your physician.    If you develop nausea and vomiting that is not controlled by your nausea medication, call the clinic.   BELOW ARE SYMPTOMS THAT SHOULD BE REPORTED IMMEDIATELY:  *FEVER GREATER THAN 100.5 F  *CHILLS WITH OR WITHOUT FEVER  NAUSEA AND VOMITING THAT IS NOT CONTROLLED WITH YOUR NAUSEA MEDICATION  *UNUSUAL SHORTNESS OF BREATH  *UNUSUAL BRUISING OR BLEEDING  TENDERNESS IN MOUTH AND THROAT WITH OR WITHOUT PRESENCE OF ULCERS  *URINARY PROBLEMS  *BOWEL PROBLEMS  UNUSUAL RASH Items with * indicate a potential emergency and should be followed up as soon as possible.  Feel free to call the clinic you have any questions or concerns. The clinic phone number is [phone].    

## 2014-05-08 NOTE — Patient Instructions (Signed)

## 2014-05-09 ENCOUNTER — Other Ambulatory Visit: Payer: Self-pay | Admitting: Oncology

## 2014-05-09 ENCOUNTER — Ambulatory Visit: Payer: Medicare Other

## 2014-05-09 ENCOUNTER — Ambulatory Visit (HOSPITAL_BASED_OUTPATIENT_CLINIC_OR_DEPARTMENT_OTHER): Payer: Medicare Other

## 2014-05-09 VITALS — BP 142/58 | HR 82 | Temp 98.2°F

## 2014-05-09 DIAGNOSIS — C5700 Malignant neoplasm of unspecified fallopian tube: Secondary | ICD-10-CM

## 2014-05-09 DIAGNOSIS — Z5189 Encounter for other specified aftercare: Secondary | ICD-10-CM

## 2014-05-09 DIAGNOSIS — C5702 Malignant neoplasm of left fallopian tube: Secondary | ICD-10-CM

## 2014-05-09 MED ORDER — TBO-FILGRASTIM 300 MCG/0.5ML ~~LOC~~ SOSY
300.0000 ug | PREFILLED_SYRINGE | Freq: Once | SUBCUTANEOUS | Status: AC
  Administered 2014-05-09: 300 ug via SUBCUTANEOUS
  Filled 2014-05-09: qty 0.5

## 2014-05-15 ENCOUNTER — Ambulatory Visit (HOSPITAL_BASED_OUTPATIENT_CLINIC_OR_DEPARTMENT_OTHER): Payer: Medicare Other | Admitting: Oncology

## 2014-05-15 ENCOUNTER — Encounter: Payer: Self-pay | Admitting: Oncology

## 2014-05-15 ENCOUNTER — Ambulatory Visit (HOSPITAL_BASED_OUTPATIENT_CLINIC_OR_DEPARTMENT_OTHER): Payer: Medicare Other

## 2014-05-15 ENCOUNTER — Telehealth: Payer: Self-pay | Admitting: *Deleted

## 2014-05-15 ENCOUNTER — Ambulatory Visit: Payer: Medicare Other

## 2014-05-15 ENCOUNTER — Other Ambulatory Visit (HOSPITAL_BASED_OUTPATIENT_CLINIC_OR_DEPARTMENT_OTHER): Payer: Medicare Other

## 2014-05-15 VITALS — BP 129/55 | HR 106 | Temp 98.3°F | Resp 18 | Ht 64.0 in | Wt 155.7 lb

## 2014-05-15 DIAGNOSIS — Z95828 Presence of other vascular implants and grafts: Secondary | ICD-10-CM

## 2014-05-15 DIAGNOSIS — Z5111 Encounter for antineoplastic chemotherapy: Secondary | ICD-10-CM | POA: Diagnosis not present

## 2014-05-15 DIAGNOSIS — C5702 Malignant neoplasm of left fallopian tube: Secondary | ICD-10-CM

## 2014-05-15 DIAGNOSIS — C5700 Malignant neoplasm of unspecified fallopian tube: Secondary | ICD-10-CM

## 2014-05-15 DIAGNOSIS — R7309 Other abnormal glucose: Secondary | ICD-10-CM

## 2014-05-15 DIAGNOSIS — I10 Essential (primary) hypertension: Secondary | ICD-10-CM

## 2014-05-15 LAB — CBC WITH DIFFERENTIAL/PLATELET
BASO%: 0.3 % (ref 0.0–2.0)
Basophils Absolute: 0 10*3/uL (ref 0.0–0.1)
EOS%: 0.1 % (ref 0.0–7.0)
Eosinophils Absolute: 0 10*3/uL (ref 0.0–0.5)
HEMATOCRIT: 28.4 % — AB (ref 34.8–46.6)
HGB: 9.6 g/dL — ABNORMAL LOW (ref 11.6–15.9)
LYMPH#: 0.5 10*3/uL — AB (ref 0.9–3.3)
LYMPH%: 14.1 % (ref 14.0–49.7)
MCH: 33 pg (ref 25.1–34.0)
MCHC: 33.8 g/dL (ref 31.5–36.0)
MCV: 97.6 fL (ref 79.5–101.0)
MONO#: 0 10*3/uL — AB (ref 0.1–0.9)
MONO%: 0.8 % (ref 0.0–14.0)
NEUT#: 2.8 10*3/uL (ref 1.5–6.5)
NEUT%: 84.7 % — AB (ref 38.4–76.8)
Platelets: 236 10*3/uL (ref 145–400)
RBC: 2.91 10*6/uL — ABNORMAL LOW (ref 3.70–5.45)
RDW: 20.5 % — AB (ref 11.2–14.5)
WBC: 3.3 10*3/uL — AB (ref 3.9–10.3)

## 2014-05-15 LAB — COMPREHENSIVE METABOLIC PANEL (CC13)
ALT: 15 U/L (ref 0–55)
AST: 23 U/L (ref 5–34)
Albumin: 3.6 g/dL (ref 3.5–5.0)
Alkaline Phosphatase: 61 U/L (ref 40–150)
Anion Gap: 13 meq/L — ABNORMAL HIGH (ref 3–11)
BUN: 26.9 mg/dL — ABNORMAL HIGH (ref 7.0–26.0)
CO2: 24 meq/L (ref 22–29)
Calcium: 9.8 mg/dL (ref 8.4–10.4)
Chloride: 103 meq/L (ref 98–109)
Creatinine: 1.3 mg/dL — ABNORMAL HIGH (ref 0.6–1.1)
Glucose: 264 mg/dL — ABNORMAL HIGH (ref 70–140)
Potassium: 3.7 meq/L (ref 3.5–5.1)
Sodium: 140 meq/L (ref 136–145)
Total Bilirubin: 0.62 mg/dL (ref 0.20–1.20)
Total Protein: 6.9 g/dL (ref 6.4–8.3)

## 2014-05-15 MED ORDER — DIPHENHYDRAMINE HCL 50 MG/ML IJ SOLN
INTRAMUSCULAR | Status: AC
  Filled 2014-05-15: qty 1

## 2014-05-15 MED ORDER — SODIUM CHLORIDE 0.9 % IV SOLN
Freq: Once | INTRAVENOUS | Status: AC
  Administered 2014-05-15: 10:00:00 via INTRAVENOUS

## 2014-05-15 MED ORDER — LIDOCAINE-PRILOCAINE 2.5-2.5 % EX CREA: TOPICAL_CREAM | CUTANEOUS | Status: DC

## 2014-05-15 MED ORDER — SODIUM CHLORIDE 0.9 % IJ SOLN
10.0000 mL | INTRAMUSCULAR | Status: DC | PRN
  Administered 2014-05-15: 10 mL via INTRAVENOUS
  Filled 2014-05-15: qty 10

## 2014-05-15 MED ORDER — INSULIN REGULAR HUMAN 100 UNIT/ML IJ SOLN
2.0000 [IU] | Freq: Once | INTRAMUSCULAR | Status: AC | PRN
  Administered 2014-05-15: 2 [IU] via SUBCUTANEOUS
  Filled 2014-05-15: qty 0.02

## 2014-05-15 MED ORDER — HEPARIN SOD (PORK) LOCK FLUSH 100 UNIT/ML IV SOLN
500.0000 [IU] | Freq: Once | INTRAVENOUS | Status: AC | PRN
  Administered 2014-05-15: 500 [IU]
  Filled 2014-05-15: qty 5

## 2014-05-15 MED ORDER — SODIUM CHLORIDE 0.9 % IJ SOLN
10.0000 mL | INTRAMUSCULAR | Status: DC | PRN
  Administered 2014-05-15: 10 mL
  Filled 2014-05-15: qty 10

## 2014-05-15 MED ORDER — DEXTROSE 5 % IV SOLN
80.0000 mg/m2 | Freq: Once | INTRAVENOUS | Status: AC
  Administered 2014-05-15: 138 mg via INTRAVENOUS
  Filled 2014-05-15: qty 23

## 2014-05-15 MED ORDER — FAMOTIDINE IN NACL 20-0.9 MG/50ML-% IV SOLN
INTRAVENOUS | Status: AC
  Filled 2014-05-15: qty 50

## 2014-05-15 MED ORDER — DIPHENHYDRAMINE HCL 50 MG/ML IJ SOLN
25.0000 mg | Freq: Once | INTRAMUSCULAR | Status: AC
  Administered 2014-05-15: 25 mg via INTRAVENOUS

## 2014-05-15 MED ORDER — DEXAMETHASONE SODIUM PHOSPHATE 20 MG/5ML IJ SOLN
INTRAMUSCULAR | Status: AC
  Filled 2014-05-15: qty 5

## 2014-05-15 MED ORDER — FAMOTIDINE IN NACL 20-0.9 MG/50ML-% IV SOLN
20.0000 mg | Freq: Once | INTRAVENOUS | Status: AC
  Administered 2014-05-15: 20 mg via INTRAVENOUS

## 2014-05-15 MED ORDER — DEXAMETHASONE SODIUM PHOSPHATE 20 MG/5ML IJ SOLN
20.0000 mg | Freq: Once | INTRAMUSCULAR | Status: AC
Start: 2014-05-15 — End: 2014-05-15
  Administered 2014-05-15: 20 mg via INTRAVENOUS

## 2014-05-15 MED ORDER — ONDANSETRON 8 MG/50ML IVPB (CHCC)
8.0000 mg | Freq: Once | INTRAVENOUS | Status: AC
  Administered 2014-05-15: 8 mg via INTRAVENOUS

## 2014-05-15 MED ORDER — ONDANSETRON 8 MG/NS 50 ML IVPB
INTRAVENOUS | Status: AC
  Filled 2014-05-15: qty 8

## 2014-05-15 NOTE — Patient Instructions (Signed)
[facility] Discharge Instructions for Patients Receiving Chemotherapy  Today you received the following chemotherapy agents Taxol  To help prevent nausea and vomiting after your treatment, we encourage you to take your nausea medication    If you develop nausea and vomiting that is not controlled by your nausea medication, call the clinic.   BELOW ARE SYMPTOMS THAT SHOULD BE REPORTED IMMEDIATELY:  *FEVER GREATER THAN 100.5 F  *CHILLS WITH OR WITHOUT FEVER  NAUSEA AND VOMITING THAT IS NOT CONTROLLED WITH YOUR NAUSEA MEDICATION  *UNUSUAL SHORTNESS OF BREATH  *UNUSUAL BRUISING OR BLEEDING  TENDERNESS IN MOUTH AND THROAT WITH OR WITHOUT PRESENCE OF ULCERS  *URINARY PROBLEMS  *BOWEL PROBLEMS  UNUSUAL RASH Items with * indicate a potential emergency and should be followed up as soon as possible.  Feel free to call the clinic you have any questions or concerns. The clinic phone number is [phone].    

## 2014-05-15 NOTE — Progress Notes (Signed)
OFFICE PROGRESS NOTE   05/15/2014   Physicians:Brewster, Abigail Butts, MD, [name]; (PCP)   INTERVAL HISTORY:  Patient is seen, alone for visit, as she continues adjuvant dose dense carboplatin taxol chemotherapy for IIIA left fallopian carcinoma, due day 8 cycle 4 today. ANC has been adequate with granix on days 2 and 9.  Patient tells me today that she had viral URI this past week, tho no fever and no lower respiratory symptoms. Husband had similar illness previously. Upper respiratory congestion is improved, with clear to whitish nasal drainage resolving. She was more fatigued with the URI and rested much of week. She also notices more fatigue with exertion such as climbing stairs, no SOB at rest and no chest pain. She has had no bleeding. Otherwise nausea is controlled with occasional antiemetic, po intake seems adequate tho likely not as much fluid as optimal this week, bowels ok, no significant peripheral neuropathy.  She has PAC. She had flu vaccine by PCP on 04-28-14.   ONCOLOGIC HISTORY Oncology History   LEFT     Fallopian tube cancer, carcinoma   01/05/2014 Initial Diagnosis Fallopian tube cancer, carcinoma LEFT  Patient had been in usual good health until acute abdominal pain for which she was seen in ED in [place] on 01-02-14 and admitted to [facility]. Per patient, CT scans were done, tho I do not have those reports; laboratory results also do not accompany (pre op CA125 not known). She was taken to optimal debulking by Dr Milana Kidney on 01-05-14 (hysterectomy, BSO, PPLND,omentectomy, pelvic peritonectomy and appendectomy. 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 No washings reported in this information. Pathologic stage pT3aN0 (stage IIIA). She was discharged home on 01-11-14. She was doing well from the surgery when seen by Dr James Ivanoff on 01-18-14, and has contiued to recover from that without problems. She was not on home lovenox.  Start of treatment has been delayed as patient moved back to [place] after follow up visit with Dr Robley Fries on 01-18-14. She saw Dr Skeet Latch in consultation on 02-09-14, also with recommendation for adjuvant chemotherapy, which began with dose dense taxol carboplatin on 02-28-14. She was neutropenic by day 15 cycle 1, with that treatment delayed x 1 week and gCSF added.   Review of systems as above, also: No new or different pain. Skin improving after initial increased erythema at scattered areas of sun damage as chemo began. Remainder of 10 point Review of Systems negative.  Objective:  Vital signs in last 24 hours:  BP 129/55  Pulse 106  Temp(Src) 98.3 F (36.8 C) (Oral)  Resp 18  Ht 5\' 4"  (1.626 m)  Wt 155 lb 11.2 oz (70.625 kg)  BMI 26.71 kg/m2 Weight down 1 lb Alert, oriented and appropriate. Ambulatory without difficulty. Looks a little tired but NAD, sounds minimally stuffy, no cough, respirations not labored in exam room Alopecia  HEENT:PERRL, sclerae not icteric. Oral mucosa moist without lesions, posterior pharynx clear. Nasal turbinates boggy without purulent drainage Neck supple. No JVD.  Lymphatics:no cervical,supraclavicular adenopathy Resp: clear to auscultation bilaterally and normal percussion bilaterally Cardio: regular rate  and rhythm. No gallop. GI: soft, nontender, not distended, no mass or organomegaly. Normally active bowel sounds. Surgical incision not remarkable. Musculoskeletal/ Extremities: without pitting edema,  cords, tenderness Neuro: no peripheral neuropathy. Otherwise nonfocal.PSYCH normal mood and affect Skin scattered erythema anterior chest at areas of sun damage, forearms improving. No ecchymosis, petechiae Portacath-without erythema or tenderness  Lab Results:  Results for orders placed in visit on 05/15/14  CBC WITH DIFFERENTIAL      Result Value Ref Range   WBC 3.3 (*) 3.9 - 10.3 10e3/uL   NEUT# 2.8  1.5 - 6.5 10e3/uL   HGB 9.6 (*) 11.6 - 15.9 g/dL   HCT 28.4 (*) 34.8 - 46.6 %   Platelets 236  145 - 400 10e3/uL   MCV 97.6  79.5 - 101.0 fL   MCH 33.0  25.1 - 34.0 pg   MCHC 33.8  31.5 - 36.0 g/dL   RBC 2.91 (*) 3.70 - 5.45 10e6/uL   RDW 20.5 (*) 11.2 - 14.5 %   lymph# 0.5 (*) 0.9 - 3.3 10e3/uL   MONO# 0.0 (*) 0.1 - 0.9 10e3/uL   Eosinophils Absolute 0.0  0.0 - 0.5 10e3/uL   Basophils Absolute 0.0  0.0 - 0.1 10e3/uL   NEUT% 84.7 (*) 38.4 - 76.8 %   LYMPH% 14.1  14.0 - 49.7 %   MONO% 0.8  0.0 - 14.0 %   EOS% 0.1  0.0 - 7.0 %   BASO% 0.3  0.0 - 2.0 %  COMPREHENSIVE METABOLIC PANEL (YH06)      Result Value Ref Range   Sodium 140  136 - 145 mEq/L   Potassium 3.7  3.5 - 5.1 mEq/L   Chloride 103  98 - 109 mEq/L   CO2 24  22 - 29 mEq/L   Glucose 264 (*) 70 - 140 mg/dl   BUN 26.9 (*) 7.0 - 26.0 mg/dL   Creatinine 1.3 (*) 0.6 - 1.1 mg/dL   Total Bilirubin 0.62  0.20 - 1.20 mg/dL   Alkaline Phosphatase 61  40 - 150 U/L   AST 23  5 - 34 U/L   ALT 15  0 - 55 U/L   Total Protein 6.9  6.4 - 8.3 g/dL   Albumin 3.6  3.5 - 5.0 g/dL   Calcium 9.8  8.4 - 10.4 mg/dL   Anion Gap 13 (*) 3 - 11 mEq/L   Chemistries ok for taxol today, RN to let her know needs to push fluids  Studies/Results:  No results found.  Medications: I have reviewed the patient's current medications. Will resume ferrous fumarate, which she has taken prior to this illness. Discussed taking this on empty stomach with OJ for best absorption. Prn benadryl or claritin ok with URI.  DISCUSSION: discussed chemo anemia,  oral iron as above. F/u re po fluids as above. Call if URI symptoms do not continue to resolve, but ok for short taxol today Requested that she let us know if symptoms of infection while on chemo.  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 Now using granix days 2 and 9. Day 8 cycle 4 today. Weekly counts and chemistries, MD in 2 weeks or sooner if needed.  2.viral URI: resolving. OK for low dose taxol today. Push fluids, otherwise as above. 3.steroid induced hyperglycemia: IVF with chemo is NS at 150 cc/hr with SS regular insulin coverage  4.thickening left breast on PE  with history of some finding on that side previously: last mammograms in [place] and I do not yet see that report in this EMR  5.higher creatinine today: increase po fluids and recheck next week 6..hypertension: appreciate adjustment in medications by Dr Mancel Bale. 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 Follow.   All questions answered. Request to schedulers to change time of subsequent chemo due to travel. Chemo orders and granix confirmed.   Carolann Brazell P, MD   05/15/2014, 9:58 AM

## 2014-05-15 NOTE — Telephone Encounter (Signed)
Per patient request I have adjusted appts

## 2014-05-15 NOTE — Patient Instructions (Signed)

## 2014-05-16 ENCOUNTER — Telehealth: Payer: Self-pay | Admitting: *Deleted

## 2014-05-16 ENCOUNTER — Telehealth: Payer: Self-pay | Admitting: Oncology

## 2014-05-16 ENCOUNTER — Ambulatory Visit (HOSPITAL_BASED_OUTPATIENT_CLINIC_OR_DEPARTMENT_OTHER): Payer: Medicare Other

## 2014-05-16 ENCOUNTER — Ambulatory Visit: Payer: Medicare Other

## 2014-05-16 VITALS — BP 147/73 | HR 88 | Temp 98.0°F

## 2014-05-16 DIAGNOSIS — C5702 Malignant neoplasm of left fallopian tube: Secondary | ICD-10-CM

## 2014-05-16 DIAGNOSIS — Z5189 Encounter for other specified aftercare: Secondary | ICD-10-CM

## 2014-05-16 DIAGNOSIS — C5700 Malignant neoplasm of unspecified fallopian tube: Secondary | ICD-10-CM

## 2014-05-16 MED ORDER — TBO-FILGRASTIM 300 MCG/0.5ML ~~LOC~~ SOSY
300.0000 ug | PREFILLED_SYRINGE | Freq: Once | SUBCUTANEOUS | Status: AC
  Administered 2014-05-16: 300 ug via SUBCUTANEOUS
  Filled 2014-05-16: qty 0.5

## 2014-05-16 NOTE — Telephone Encounter (Signed)
Message copied by Christa See on Tue May 16, 2014  2:54 PM ------      Message from: Gordy Levan      Created: Tue May 16, 2014  8:02 AM       Labs seen and need follow up: please let her know she was probably a little dehydrated yesterday with the recent UTI. Push fluids please ------

## 2014-05-16 NOTE — Telephone Encounter (Signed)
Called patient as noted below by Dr. Marko Plume. Encouraged her to push fluids over the next week - told patient to try her best to get 64 oz of fluid in daily. Confirmed with patient that she will be here next Monday, 05/22/14, for labs and chemo.

## 2014-05-16 NOTE — Telephone Encounter (Signed)
, °

## 2014-05-17 ENCOUNTER — Other Ambulatory Visit: Payer: Self-pay | Admitting: Oncology

## 2014-05-17 ENCOUNTER — Telehealth: Payer: Self-pay | Admitting: Oncology

## 2014-05-17 NOTE — Telephone Encounter (Signed)
, °

## 2014-05-18 ENCOUNTER — Telehealth: Payer: Self-pay | Admitting: *Deleted

## 2014-05-18 ENCOUNTER — Telehealth: Payer: Self-pay

## 2014-05-18 NOTE — Telephone Encounter (Signed)
Told Joanne Copeland the results of the labs from 05-15-14 as noted below by Dr. Marko Plume.  Pt. Stated that she was on decongestants for URI. Suggested that she take in at least 64 oz of fluid a day.  Patient verbalized understanding.

## 2014-05-18 NOTE — Telephone Encounter (Signed)
I have adjusted appts in Oct. Per scheduler

## 2014-05-18 NOTE — Telephone Encounter (Signed)
Message copied by Baruch Merl on Thu May 18, 2014  3:07 PM ------      Message from: Gordy Levan      Created: Tue May 16, 2014  8:02 AM       Labs seen and need follow up: please let her know she was probably a little dehydrated yesterday with the recent UTI. Push fluids please ------

## 2014-05-19 ENCOUNTER — Telehealth: Payer: Self-pay | Admitting: Genetic Counselor

## 2014-05-19 NOTE — Telephone Encounter (Signed)
Pt aware of genetic appt. On 05/31/14@9 :00

## 2014-05-22 ENCOUNTER — Ambulatory Visit (HOSPITAL_BASED_OUTPATIENT_CLINIC_OR_DEPARTMENT_OTHER): Payer: Medicare Other

## 2014-05-22 ENCOUNTER — Other Ambulatory Visit (HOSPITAL_BASED_OUTPATIENT_CLINIC_OR_DEPARTMENT_OTHER): Payer: Medicare Other

## 2014-05-22 VITALS — BP 138/65 | HR 93 | Temp 98.3°F | Resp 20

## 2014-05-22 DIAGNOSIS — C5700 Malignant neoplasm of unspecified fallopian tube: Secondary | ICD-10-CM

## 2014-05-22 DIAGNOSIS — Z5111 Encounter for antineoplastic chemotherapy: Secondary | ICD-10-CM

## 2014-05-22 DIAGNOSIS — Z95828 Presence of other vascular implants and grafts: Secondary | ICD-10-CM

## 2014-05-22 DIAGNOSIS — C5702 Malignant neoplasm of left fallopian tube: Secondary | ICD-10-CM

## 2014-05-22 LAB — COMPREHENSIVE METABOLIC PANEL (CC13)
ALBUMIN: 3.5 g/dL (ref 3.5–5.0)
ALT: 15 U/L (ref 0–55)
AST: 14 U/L (ref 5–34)
Alkaline Phosphatase: 53 U/L (ref 40–150)
Anion Gap: 13 mEq/L — ABNORMAL HIGH (ref 3–11)
BUN: 19.3 mg/dL (ref 7.0–26.0)
CO2: 23 mEq/L (ref 22–29)
Calcium: 9.8 mg/dL (ref 8.4–10.4)
Chloride: 105 mEq/L (ref 98–109)
Creatinine: 1 mg/dL (ref 0.6–1.1)
Glucose: 224 mg/dl — ABNORMAL HIGH (ref 70–140)
POTASSIUM: 4 meq/L (ref 3.5–5.1)
SODIUM: 140 meq/L (ref 136–145)
TOTAL PROTEIN: 6.5 g/dL (ref 6.4–8.3)
Total Bilirubin: 0.37 mg/dL (ref 0.20–1.20)

## 2014-05-22 LAB — CBC WITH DIFFERENTIAL/PLATELET
BASO%: 0.3 % (ref 0.0–2.0)
Basophils Absolute: 0 10*3/uL (ref 0.0–0.1)
EOS ABS: 0 10*3/uL (ref 0.0–0.5)
EOS%: 0 % (ref 0.0–7.0)
HCT: 25.9 % — ABNORMAL LOW (ref 34.8–46.6)
HGB: 8.6 g/dL — ABNORMAL LOW (ref 11.6–15.9)
LYMPH%: 15.7 % (ref 14.0–49.7)
MCH: 33.2 pg (ref 25.1–34.0)
MCHC: 33.3 g/dL (ref 31.5–36.0)
MCV: 99.7 fL (ref 79.5–101.0)
MONO#: 0 10*3/uL — AB (ref 0.1–0.9)
MONO%: 0.8 % (ref 0.0–14.0)
NEUT#: 3.4 10*3/uL (ref 1.5–6.5)
NEUT%: 83.2 % — ABNORMAL HIGH (ref 38.4–76.8)
PLATELETS: 189 10*3/uL (ref 145–400)
RBC: 2.6 10*6/uL — AB (ref 3.70–5.45)
RDW: 21 % — AB (ref 11.2–14.5)
WBC: 4 10*3/uL (ref 3.9–10.3)
lymph#: 0.6 10*3/uL — ABNORMAL LOW (ref 0.9–3.3)

## 2014-05-22 MED ORDER — SODIUM CHLORIDE 0.9 % IV SOLN
Freq: Once | INTRAVENOUS | Status: AC
  Administered 2014-05-22: 11:00:00 via INTRAVENOUS

## 2014-05-22 MED ORDER — DEXAMETHASONE SODIUM PHOSPHATE 20 MG/5ML IJ SOLN
INTRAMUSCULAR | Status: AC
  Filled 2014-05-22: qty 5

## 2014-05-22 MED ORDER — FAMOTIDINE IN NACL 20-0.9 MG/50ML-% IV SOLN
INTRAVENOUS | Status: AC
  Filled 2014-05-22: qty 50

## 2014-05-22 MED ORDER — ONDANSETRON 8 MG/50ML IVPB (CHCC)
8.0000 mg | Freq: Once | INTRAVENOUS | Status: AC
  Administered 2014-05-22: 8 mg via INTRAVENOUS

## 2014-05-22 MED ORDER — ONDANSETRON 8 MG/NS 50 ML IVPB
INTRAVENOUS | Status: AC
  Filled 2014-05-22: qty 8

## 2014-05-22 MED ORDER — SODIUM CHLORIDE 0.9 % IJ SOLN
10.0000 mL | INTRAMUSCULAR | Status: DC | PRN
  Administered 2014-05-22: 10 mL
  Filled 2014-05-22: qty 10

## 2014-05-22 MED ORDER — PACLITAXEL CHEMO INJECTION 300 MG/50ML
80.0000 mg/m2 | Freq: Once | INTRAVENOUS | Status: AC
  Administered 2014-05-22: 138 mg via INTRAVENOUS
  Filled 2014-05-22: qty 23

## 2014-05-22 MED ORDER — SODIUM CHLORIDE 0.9 % IJ SOLN
10.0000 mL | INTRAMUSCULAR | Status: DC | PRN
  Administered 2014-05-22: 10 mL via INTRAVENOUS
  Filled 2014-05-22: qty 10

## 2014-05-22 MED ORDER — INSULIN REGULAR HUMAN 100 UNIT/ML IJ SOLN
2.0000 [IU] | Freq: Once | INTRAMUSCULAR | Status: DC | PRN
  Filled 2014-05-22: qty 0.02

## 2014-05-22 MED ORDER — DIPHENHYDRAMINE HCL 50 MG/ML IJ SOLN
INTRAMUSCULAR | Status: AC
  Filled 2014-05-22: qty 1

## 2014-05-22 MED ORDER — FAMOTIDINE IN NACL 20-0.9 MG/50ML-% IV SOLN
20.0000 mg | Freq: Once | INTRAVENOUS | Status: AC
  Administered 2014-05-22: 20 mg via INTRAVENOUS

## 2014-05-22 MED ORDER — DIPHENHYDRAMINE HCL 50 MG/ML IJ SOLN
25.0000 mg | Freq: Once | INTRAMUSCULAR | Status: AC
  Administered 2014-05-22: 25 mg via INTRAVENOUS

## 2014-05-22 MED ORDER — HEPARIN SOD (PORK) LOCK FLUSH 100 UNIT/ML IV SOLN
500.0000 [IU] | Freq: Once | INTRAVENOUS | Status: AC | PRN
  Administered 2014-05-22: 500 [IU]
  Filled 2014-05-22: qty 5

## 2014-05-22 MED ORDER — DEXAMETHASONE SODIUM PHOSPHATE 20 MG/5ML IJ SOLN
20.0000 mg | Freq: Once | INTRAMUSCULAR | Status: AC
  Administered 2014-05-22: 20 mg via INTRAVENOUS

## 2014-05-22 NOTE — Patient Instructions (Signed)
[facility] Discharge Instructions for Patients Receiving Chemotherapy  Today you received the following chemotherapy agents Taxol  To help prevent nausea and vomiting after your treatment, we encourage you to take your nausea medication as needed.   If you develop nausea and vomiting that is not controlled by your nausea medication, call the clinic.   BELOW ARE SYMPTOMS THAT SHOULD BE REPORTED IMMEDIATELY:  *FEVER GREATER THAN 100.5 F  *CHILLS WITH OR WITHOUT FEVER  NAUSEA AND VOMITING THAT IS NOT CONTROLLED WITH YOUR NAUSEA MEDICATION  *UNUSUAL SHORTNESS OF BREATH  *UNUSUAL BRUISING OR BLEEDING  TENDERNESS IN MOUTH AND THROAT WITH OR WITHOUT PRESENCE OF ULCERS  *URINARY PROBLEMS  *BOWEL PROBLEMS  UNUSUAL RASH Items with * indicate a potential emergency and should be followed up as soon as possible.  Feel free to call the clinic you have any questions or concerns. The clinic phone number is [phone].    

## 2014-05-22 NOTE — Patient Instructions (Signed)

## 2014-05-29 ENCOUNTER — Other Ambulatory Visit (HOSPITAL_BASED_OUTPATIENT_CLINIC_OR_DEPARTMENT_OTHER): Payer: Medicare Other

## 2014-05-29 ENCOUNTER — Ambulatory Visit: Payer: Medicare Other

## 2014-05-29 ENCOUNTER — Other Ambulatory Visit: Payer: Self-pay | Admitting: Oncology

## 2014-05-29 ENCOUNTER — Ambulatory Visit (HOSPITAL_BASED_OUTPATIENT_CLINIC_OR_DEPARTMENT_OTHER): Payer: Medicare Other

## 2014-05-29 VITALS — BP 154/39 | HR 95 | Temp 98.1°F | Resp 18

## 2014-05-29 DIAGNOSIS — C5702 Malignant neoplasm of left fallopian tube: Secondary | ICD-10-CM

## 2014-05-29 DIAGNOSIS — Z5111 Encounter for antineoplastic chemotherapy: Secondary | ICD-10-CM

## 2014-05-29 DIAGNOSIS — Z95828 Presence of other vascular implants and grafts: Secondary | ICD-10-CM

## 2014-05-29 LAB — CBC WITH DIFFERENTIAL/PLATELET
BASO%: 0.6 % (ref 0.0–2.0)
BASOS ABS: 0 10*3/uL (ref 0.0–0.1)
EOS ABS: 0 10*3/uL (ref 0.0–0.5)
EOS%: 0.1 % (ref 0.0–7.0)
HCT: 24.1 % — ABNORMAL LOW (ref 34.8–46.6)
HEMOGLOBIN: 8 g/dL — AB (ref 11.6–15.9)
LYMPH#: 0.4 10*3/uL — AB (ref 0.9–3.3)
LYMPH%: 12.2 % — ABNORMAL LOW (ref 14.0–49.7)
MCH: 34.1 pg — ABNORMAL HIGH (ref 25.1–34.0)
MCHC: 33.4 g/dL (ref 31.5–36.0)
MCV: 102.3 fL — ABNORMAL HIGH (ref 79.5–101.0)
MONO#: 0 10*3/uL — ABNORMAL LOW (ref 0.1–0.9)
MONO%: 0.6 % (ref 0.0–14.0)
NEUT#: 3 10*3/uL (ref 1.5–6.5)
NEUT%: 86.5 % — ABNORMAL HIGH (ref 38.4–76.8)
Platelets: 162 10*3/uL (ref 145–400)
RBC: 2.36 10*6/uL — ABNORMAL LOW (ref 3.70–5.45)
RDW: 21.9 % — AB (ref 11.2–14.5)
WBC: 3.4 10*3/uL — ABNORMAL LOW (ref 3.9–10.3)

## 2014-05-29 LAB — COMPREHENSIVE METABOLIC PANEL (CC13)
ALBUMIN: 3.4 g/dL — AB (ref 3.5–5.0)
ALT: 15 U/L (ref 0–55)
AST: 15 U/L (ref 5–34)
Alkaline Phosphatase: 46 U/L (ref 40–150)
Anion Gap: 14 mEq/L — ABNORMAL HIGH (ref 3–11)
BUN: 23.1 mg/dL (ref 7.0–26.0)
CHLORIDE: 106 meq/L (ref 98–109)
CO2: 20 mEq/L — ABNORMAL LOW (ref 22–29)
Calcium: 9.6 mg/dL (ref 8.4–10.4)
Creatinine: 1.1 mg/dL (ref 0.6–1.1)
GLUCOSE: 279 mg/dL — AB (ref 70–140)
Potassium: 3.8 mEq/L (ref 3.5–5.1)
Sodium: 140 mEq/L (ref 136–145)
Total Bilirubin: 0.39 mg/dL (ref 0.20–1.20)
Total Protein: 6.2 g/dL — ABNORMAL LOW (ref 6.4–8.3)

## 2014-05-29 MED ORDER — PACLITAXEL CHEMO INJECTION 300 MG/50ML
80.0000 mg/m2 | Freq: Once | INTRAVENOUS | Status: AC
  Administered 2014-05-29: 138 mg via INTRAVENOUS
  Filled 2014-05-29: qty 23

## 2014-05-29 MED ORDER — INSULIN REGULAR HUMAN 100 UNIT/ML IJ SOLN
2.0000 [IU] | Freq: Once | INTRAMUSCULAR | Status: AC | PRN
  Administered 2014-05-29: 2 [IU] via SUBCUTANEOUS
  Filled 2014-05-29: qty 0.02

## 2014-05-29 MED ORDER — ONDANSETRON 16 MG/50ML IVPB (CHCC)
16.0000 mg | Freq: Once | INTRAVENOUS | Status: AC
  Administered 2014-05-29: 16 mg via INTRAVENOUS

## 2014-05-29 MED ORDER — SODIUM CHLORIDE 0.9 % IV SOLN
Freq: Once | INTRAVENOUS | Status: AC
  Administered 2014-05-29: 11:00:00 via INTRAVENOUS

## 2014-05-29 MED ORDER — DIPHENHYDRAMINE HCL 50 MG/ML IJ SOLN
INTRAMUSCULAR | Status: AC
  Filled 2014-05-29: qty 1

## 2014-05-29 MED ORDER — SODIUM CHLORIDE 0.9 % IV SOLN
380.0000 mg | Freq: Once | INTRAVENOUS | Status: AC
  Administered 2014-05-29: 380 mg via INTRAVENOUS
  Filled 2014-05-29: qty 38

## 2014-05-29 MED ORDER — FAMOTIDINE IN NACL 20-0.9 MG/50ML-% IV SOLN
20.0000 mg | Freq: Once | INTRAVENOUS | Status: AC
  Administered 2014-05-29: 20 mg via INTRAVENOUS

## 2014-05-29 MED ORDER — DIPHENHYDRAMINE HCL 50 MG/ML IJ SOLN
25.0000 mg | Freq: Once | INTRAMUSCULAR | Status: AC
  Administered 2014-05-29: 25 mg via INTRAVENOUS

## 2014-05-29 MED ORDER — DEXAMETHASONE SODIUM PHOSPHATE 20 MG/5ML IJ SOLN
INTRAMUSCULAR | Status: AC
  Filled 2014-05-29: qty 5

## 2014-05-29 MED ORDER — SODIUM CHLORIDE 0.9 % IJ SOLN
10.0000 mL | INTRAMUSCULAR | Status: DC | PRN
  Administered 2014-05-29: 10 mL
  Filled 2014-05-29: qty 10

## 2014-05-29 MED ORDER — FAMOTIDINE IN NACL 20-0.9 MG/50ML-% IV SOLN
INTRAVENOUS | Status: AC
Start: 2014-05-29 — End: 2014-05-29
  Filled 2014-05-29: qty 50

## 2014-05-29 MED ORDER — SODIUM CHLORIDE 0.9 % IJ SOLN
10.0000 mL | INTRAMUSCULAR | Status: DC | PRN
  Administered 2014-05-29: 10 mL via INTRAVENOUS
  Filled 2014-05-29: qty 10

## 2014-05-29 MED ORDER — ONDANSETRON 16 MG/50ML IVPB (CHCC)
INTRAVENOUS | Status: AC
  Filled 2014-05-29: qty 16

## 2014-05-29 MED ORDER — HEPARIN SOD (PORK) LOCK FLUSH 100 UNIT/ML IV SOLN
500.0000 [IU] | Freq: Once | INTRAVENOUS | Status: AC | PRN
  Administered 2014-05-29: 500 [IU]
  Filled 2014-05-29: qty 5

## 2014-05-29 MED ORDER — DEXAMETHASONE SODIUM PHOSPHATE 20 MG/5ML IJ SOLN
20.0000 mg | Freq: Once | INTRAMUSCULAR | Status: AC
  Administered 2014-05-29: 20 mg via INTRAVENOUS

## 2014-05-29 NOTE — Patient Instructions (Signed)

## 2014-05-29 NOTE — Patient Instructions (Signed)
[facility] Discharge Instructions for Patients Receiving Chemotherapy  Today you received the following chemotherapy agents:  Taxol & Carboplatin  To help prevent nausea and vomiting after your treatment, we encourage you to take your nausea medication,    If you develop nausea and vomiting that is not controlled by your nausea medication, call the clinic.   BELOW ARE SYMPTOMS THAT SHOULD BE REPORTED IMMEDIATELY:  *FEVER GREATER THAN 100.5 F  *CHILLS WITH OR WITHOUT FEVER  NAUSEA AND VOMITING THAT IS NOT CONTROLLED WITH YOUR NAUSEA MEDICATION  *UNUSUAL SHORTNESS OF BREATH  *UNUSUAL BRUISING OR BLEEDING  TENDERNESS IN MOUTH AND THROAT WITH OR WITHOUT PRESENCE OF ULCERS  *URINARY PROBLEMS  *BOWEL PROBLEMS  UNUSUAL RASH Items with * indicate a potential emergency and should be followed up as soon as possible.  Feel free to call the clinic you have any questions or concerns. The clinic phone number is [phone].

## 2014-05-30 ENCOUNTER — Ambulatory Visit (HOSPITAL_BASED_OUTPATIENT_CLINIC_OR_DEPARTMENT_OTHER): Payer: Medicare Other

## 2014-05-30 VITALS — BP 138/47 | HR 90 | Temp 98.0°F

## 2014-05-30 DIAGNOSIS — Z5189 Encounter for other specified aftercare: Secondary | ICD-10-CM

## 2014-05-30 DIAGNOSIS — C5702 Malignant neoplasm of left fallopian tube: Secondary | ICD-10-CM

## 2014-05-30 MED ORDER — TBO-FILGRASTIM 300 MCG/0.5ML ~~LOC~~ SOSY
300.0000 ug | PREFILLED_SYRINGE | Freq: Once | SUBCUTANEOUS | Status: AC
  Administered 2014-05-30: 300 ug via SUBCUTANEOUS
  Filled 2014-05-30: qty 0.5

## 2014-05-30 NOTE — Patient Instructions (Signed)

## 2014-05-31 ENCOUNTER — Other Ambulatory Visit: Payer: Medicare Other

## 2014-05-31 ENCOUNTER — Ambulatory Visit: Payer: Medicare Other | Admitting: Oncology

## 2014-05-31 ENCOUNTER — Encounter: Payer: Self-pay | Admitting: Genetic Counselor

## 2014-05-31 ENCOUNTER — Ambulatory Visit (HOSPITAL_BASED_OUTPATIENT_CLINIC_OR_DEPARTMENT_OTHER): Payer: Medicare Other | Admitting: Genetic Counselor

## 2014-05-31 DIAGNOSIS — C5702 Malignant neoplasm of left fallopian tube: Secondary | ICD-10-CM

## 2014-05-31 DIAGNOSIS — Z315 Encounter for genetic counseling: Secondary | ICD-10-CM

## 2014-05-31 DIAGNOSIS — Z8042 Family history of malignant neoplasm of prostate: Secondary | ICD-10-CM

## 2014-05-31 NOTE — Progress Notes (Signed)
Dr.  Evlyn Clines requested a consultation for genetic counseling and risk assessment for Joanne Copeland, a 73 y.o. female, for discussion of her personal history of fallopian tube cancer and family history of prostate cancer.  She presents to clinic today to discuss the possibility of a genetic predisposition to cancer, and to further clarify her risks, as well as her family members' risks for cancer.   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   No genetic testing has been performed.     Past Medical History  Diagnosis Date  . Pelvic mass   . Hypertension   . Hypercholesterolemia   . Ovarian cancer     Past Surgical History  Procedure Laterality Date  . Total abdominal hysterectomy w/ bilateral salpingoophorectomy  01/05/2014  . Radical tumor debulking  01/05/2014  . Omentectomy pplnd  01/05/2014    History   Social History  . Marital Status: Married    Spouse Name: N/A    Number of Children: N/A  . Years of Education: N/A   Social History Main Topics  . Smoking status: Never Smoker   . Smokeless tobacco: None  . Alcohol Use: 4.2 oz/week    7 Glasses of wine per week     Comment: " Glass of wine per night & a cocktail"  . Drug Use: No  . Sexual Activity: Not Currently   Other Topics Concern  . None   Social History Narrative  . None    REPRODUCTIVE HISTORY AND PERSONAL RISK ASSESSMENT FACTORS: Menarche was at age 87.   postmenopausal Uterus Intact: no Ovaries Intact: no G2P2A0, first live birth at age 79  She has not previously undergone treatment for infertility.   Oral Contraceptive use: 6 years   She has used HRT in the past.    FAMILY HISTORY:  We obtained a detailed, 4-generation family history.  Significant diagnoses are listed below: Family History  Problem Relation Age of Onset  . Prostate cancer Brother   . Heart attack Father 61  The patient has 82 brothers and sisters  and had 11 maternal aunts and uncles.  Only her brother was diagnosed with cancer.  Patient's maternal ancestors are of [place] descent, and paternal ancestors are of [place] and [place] descent. There is no reported Ashkenazi Jewish ancestry. There is no known consanguinity.  GENETIC COUNSELING ASSESSMENT: Joanne Copeland is a 73 y.o. female with a personal history of fallopian tube cancer and family history of prostate cancer which somewhat suggestive of a hereditary cancer syndrome and predisposition to cancer. We, therefore, discussed and recommended the following at today's visit.   DISCUSSION: We reviewed the characteristics, features and inheritance patterns of hereditary cancer syndromes. We also discussed genetic testing, including the appropriate family members to test, the process of testing, insurance coverage and turn-around-time for results. We reviewed that the greatest risk for fallopian tube cancer being from a hereditary cancer syndrome is from BRCA1 and BRCA2 mutations.  Other times it can be the result of Lynch syndrome.  In reviewing her family history, we would typically find more people in the family with cancer.  We discussed that she meets criteria for testing based on her diagnosis but that based on her family history, the chance that we would find something is low.  Joanne Copeland indicated that she was not interested in pursuing genetic testing unless it would help with our research.  I discussed that the testing that we offered was not research based, but instead was clinical testing.  PLAN: After considering the risks, benefits, and limitations, Joanne Copeland declined genetic testing. We discussed that if she changed her mind we could always draw blood at another time, that would be coordinated with her appointment.  Joanne Copeland was in agreement with this plan.  We encouraged Joanne Copeland to remain in contact with cancer genetics annually so that we can continuously update the family  history and inform her of any changes in cancer genetics and testing that may be of benefit for her family. Joanne Copeland's questions were answered to her satisfaction today. Our contact information was provided should additional questions or concerns arise.  The patient was seen for a total of 45 minutes, greater than 50% of which was spent face-to-face counseling.  This note will also be sent to the referring provider via the electronic medical record. The patient will be supplied with a summary of this genetic counseling discussion as well as educational information on the discussed hereditary cancer syndromes following the conclusion of their visit.     _______________________________________________________________________ For Office Staff:  Number of people involved in session: 1 Was an Intern/ student involved with case: no

## 2014-06-04 ENCOUNTER — Other Ambulatory Visit: Payer: Self-pay | Admitting: Oncology

## 2014-06-05 ENCOUNTER — Other Ambulatory Visit: Payer: Self-pay | Admitting: Oncology

## 2014-06-05 ENCOUNTER — Other Ambulatory Visit: Payer: Self-pay | Admitting: *Deleted

## 2014-06-05 ENCOUNTER — Encounter: Payer: Self-pay | Admitting: Oncology

## 2014-06-05 ENCOUNTER — Telehealth: Payer: Self-pay | Admitting: *Deleted

## 2014-06-05 ENCOUNTER — Ambulatory Visit (HOSPITAL_COMMUNITY)
Admission: RE | Admit: 2014-06-05 | Discharge: 2014-06-05 | Disposition: A | Discharge status: Home / Self Care | Payer: Medicare Other | Source: Ambulatory Visit | Attending: Oncology | Admitting: Oncology

## 2014-06-05 ENCOUNTER — Ambulatory Visit (HOSPITAL_BASED_OUTPATIENT_CLINIC_OR_DEPARTMENT_OTHER): Payer: Medicare Other

## 2014-06-05 ENCOUNTER — Other Ambulatory Visit: Payer: Medicare Other

## 2014-06-05 ENCOUNTER — Ambulatory Visit (HOSPITAL_BASED_OUTPATIENT_CLINIC_OR_DEPARTMENT_OTHER): Payer: Medicare Other | Admitting: Oncology

## 2014-06-05 ENCOUNTER — Telehealth: Payer: Self-pay | Admitting: Oncology

## 2014-06-05 ENCOUNTER — Ambulatory Visit: Payer: Medicare Other

## 2014-06-05 VITALS — BP 164/65 | HR 96 | Temp 98.4°F | Resp 18 | Ht 64.0 in | Wt 160.5 lb

## 2014-06-05 DIAGNOSIS — D539 Nutritional anemia, unspecified: Secondary | ICD-10-CM | POA: Insufficient documentation

## 2014-06-05 DIAGNOSIS — C5702 Malignant neoplasm of left fallopian tube: Secondary | ICD-10-CM

## 2014-06-05 DIAGNOSIS — D6481 Anemia due to antineoplastic chemotherapy: Secondary | ICD-10-CM | POA: Insufficient documentation

## 2014-06-05 DIAGNOSIS — T451X5A Adverse effect of antineoplastic and immunosuppressive drugs, initial encounter: Secondary | ICD-10-CM | POA: Diagnosis not present

## 2014-06-05 DIAGNOSIS — D630 Anemia in neoplastic disease: Secondary | ICD-10-CM

## 2014-06-05 DIAGNOSIS — Z95828 Presence of other vascular implants and grafts: Secondary | ICD-10-CM

## 2014-06-05 DIAGNOSIS — G620 Drug-induced polyneuropathy: Secondary | ICD-10-CM

## 2014-06-05 DIAGNOSIS — D649 Anemia, unspecified: Secondary | ICD-10-CM

## 2014-06-05 DIAGNOSIS — R739 Hyperglycemia, unspecified: Secondary | ICD-10-CM

## 2014-06-05 DIAGNOSIS — Z5111 Encounter for antineoplastic chemotherapy: Secondary | ICD-10-CM

## 2014-06-05 DIAGNOSIS — I10 Essential (primary) hypertension: Secondary | ICD-10-CM

## 2014-06-05 LAB — ABO/RH: ABO/RH(D): A NEG

## 2014-06-05 LAB — CBC WITH DIFFERENTIAL/PLATELET
BASO%: 0.3 % (ref 0.0–2.0)
Basophils Absolute: 0 10*3/uL (ref 0.0–0.1)
EOS%: 0 % (ref 0.0–7.0)
Eosinophils Absolute: 0 10*3/uL (ref 0.0–0.5)
HCT: 21.2 % — ABNORMAL LOW (ref 34.8–46.6)
HEMOGLOBIN: 7.3 g/dL — AB (ref 11.6–15.9)
LYMPH#: 0.5 10*3/uL — AB (ref 0.9–3.3)
LYMPH%: 15.3 % (ref 14.0–49.7)
MCH: 35.1 pg — AB (ref 25.1–34.0)
MCHC: 34.4 g/dL (ref 31.5–36.0)
MCV: 101.9 fL — ABNORMAL HIGH (ref 79.5–101.0)
MONO#: 0 10*3/uL — ABNORMAL LOW (ref 0.1–0.9)
MONO%: 1.3 % (ref 0.0–14.0)
NEUT#: 2.5 10*3/uL (ref 1.5–6.5)
NEUT%: 83.1 % — ABNORMAL HIGH (ref 38.4–76.8)
Platelets: 155 10*3/uL (ref 145–400)
RBC: 2.08 10*6/uL — AB (ref 3.70–5.45)
RDW: 18.7 % — AB (ref 11.2–14.5)
WBC: 3 10*3/uL — ABNORMAL LOW (ref 3.9–10.3)

## 2014-06-05 LAB — COMPREHENSIVE METABOLIC PANEL (CC13)
ALBUMIN: 3.5 g/dL (ref 3.5–5.0)
ALK PHOS: 50 U/L (ref 40–150)
ALT: 13 U/L (ref 0–55)
AST: 16 U/L (ref 5–34)
Anion Gap: 10 mEq/L (ref 3–11)
BUN: 24.3 mg/dL (ref 7.0–26.0)
CO2: 23 mEq/L (ref 22–29)
Calcium: 9.3 mg/dL (ref 8.4–10.4)
Chloride: 104 mEq/L (ref 98–109)
Creatinine: 1.1 mg/dL (ref 0.6–1.1)
Glucose: 222 mg/dl — ABNORMAL HIGH (ref 70–140)
POTASSIUM: 4.2 meq/L (ref 3.5–5.1)
Sodium: 137 mEq/L (ref 136–145)
Total Bilirubin: 0.5 mg/dL (ref 0.20–1.20)
Total Protein: 6.1 g/dL — ABNORMAL LOW (ref 6.4–8.3)

## 2014-06-05 LAB — PREPARE RBC (CROSSMATCH)

## 2014-06-05 MED ORDER — FAMOTIDINE IN NACL 20-0.9 MG/50ML-% IV SOLN
INTRAVENOUS | Status: AC
  Filled 2014-06-05: qty 50

## 2014-06-05 MED ORDER — DEXAMETHASONE 4 MG PO TABS: ORAL_TABLET | ORAL | Status: DC

## 2014-06-05 MED ORDER — PACLITAXEL CHEMO INJECTION 300 MG/50ML
80.0000 mg/m2 | Freq: Once | INTRAVENOUS | Status: AC
  Administered 2014-06-05: 138 mg via INTRAVENOUS
  Filled 2014-06-05: qty 23

## 2014-06-05 MED ORDER — DIPHENHYDRAMINE HCL 50 MG/ML IJ SOLN
25.0000 mg | Freq: Once | INTRAMUSCULAR | Status: AC
  Administered 2014-06-05: 25 mg via INTRAVENOUS

## 2014-06-05 MED ORDER — SODIUM CHLORIDE 0.9 % IV SOLN
Freq: Once | INTRAVENOUS | Status: AC
  Administered 2014-06-05: 15:00:00 via INTRAVENOUS

## 2014-06-05 MED ORDER — FAMOTIDINE IN NACL 20-0.9 MG/50ML-% IV SOLN
20.0000 mg | Freq: Once | INTRAVENOUS | Status: AC
  Administered 2014-06-05: 20 mg via INTRAVENOUS

## 2014-06-05 MED ORDER — DEXAMETHASONE SODIUM PHOSPHATE 20 MG/5ML IJ SOLN
INTRAMUSCULAR | Status: AC
  Filled 2014-06-05: qty 5

## 2014-06-05 MED ORDER — SODIUM CHLORIDE 0.9 % IJ SOLN
10.0000 mL | INTRAMUSCULAR | Status: DC | PRN
  Administered 2014-06-05: 10 mL via INTRAVENOUS
  Filled 2014-06-05: qty 10

## 2014-06-05 MED ORDER — DIPHENHYDRAMINE HCL 50 MG/ML IJ SOLN
INTRAMUSCULAR | Status: AC
  Filled 2014-06-05: qty 1

## 2014-06-05 MED ORDER — HEPARIN SOD (PORK) LOCK FLUSH 100 UNIT/ML IV SOLN
500.0000 [IU] | Freq: Once | INTRAVENOUS | Status: AC | PRN
  Administered 2014-06-05: 500 [IU]
  Filled 2014-06-05: qty 5

## 2014-06-05 MED ORDER — SODIUM CHLORIDE 0.9 % IJ SOLN
10.0000 mL | INTRAMUSCULAR | Status: DC | PRN
  Administered 2014-06-05: 10 mL
  Filled 2014-06-05: qty 10

## 2014-06-05 MED ORDER — DEXAMETHASONE SODIUM PHOSPHATE 20 MG/5ML IJ SOLN
20.0000 mg | Freq: Once | INTRAMUSCULAR | Status: AC
  Administered 2014-06-05: 20 mg via INTRAVENOUS

## 2014-06-05 MED ORDER — ONDANSETRON 8 MG/NS 50 ML IVPB
INTRAVENOUS | Status: AC
  Filled 2014-06-05: qty 8

## 2014-06-05 MED ORDER — ONDANSETRON 8 MG/50ML IVPB (CHCC)
8.0000 mg | Freq: Once | INTRAVENOUS | Status: AC
  Administered 2014-06-05: 8 mg via INTRAVENOUS

## 2014-06-05 NOTE — Telephone Encounter (Signed)
Per staff message and POF I have scheduled appts. Advised scheduler of appts. JMW  

## 2014-06-05 NOTE — Patient Instructions (Signed)

## 2014-06-05 NOTE — Progress Notes (Signed)
OFFICE PROGRESS NOTE   06/05/2014   Physicians:Brewster, Abigail Butts, MD, [name]; (PCP)   INTERVAL HISTORY:  Patient is seen, alone for visit, in continuing attention to adjuvant chemotherapy with dose dense carbo taxol for IIIA left fallopian carcinoma, due day 8 cycle 5 today. Hemoglobin today is down to 7.3 and she is symptomatic with DOE and fatigue; other counts are adequate for treatment.  Patient denies any bleeding, tho stools have been black since back on oral iron for past 2 weeks. She agrees with transfusion 2 units PRBCs, which will be set up as soon as possible. She will do hemoccult cards x3. She is not SOB at rest and has had no chest pain.  Otherwise nausea has been controlled with prn antiemetics and she has been eating. She has new numbness just in mid feet. Upper respiratory symptoms have resolved.  She has PAC. She had flu vaccine  She will be in [place] thru Nov 1, which includes 46th wedding anniversary with her husband.  ONCOLOGIC HISTORY Oncology History   LEFT     Fallopian tube cancer, carcinoma   01/05/2014 Initial Diagnosis Fallopian tube cancer, carcinoma LEFT  Patient had been in usual good health until acute abdominal pain for which she was seen in ED in [place] on 01-02-14 and admitted to [facility]. Per patient, CT scans were done, tho I do not have those reports; laboratory results also do not accompany (pre op CA125 not known). She was taken to optimal debulking by Dr Milana Kidney on 01-05-14 (hysterectomy, BSO, PPLND,omentectomy, pelvic peritonectomy and appendectomy. 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 No washings reported in this information. Pathologic stage pT3aN0 (stage IIIA). She was discharged home on 01-11-14. She was doing well from the surgery when seen by Dr James Ivanoff on 01-18-14, and has contiued to recover from that without problems. She was not on home lovenox.  Start of treatment was delayed as patient moved back to [place] after follow up visit with Dr Robley Fries on 01-18-14. She saw Dr Skeet Latch in consultation on 02-09-14, also with recommendation for adjuvant chemotherapy, which began with dose dense taxol carboplatin on 02-28-14. She was neutropenic by day 15 cycle 1, with that treatment delayed x 1 week and gCSF added.   Review of systems as above, also: No fever or symptoms of infection. No problems with PAC. No neuropathy in hands Remainder of 10 point Review of Systems negative.  Objective:  Vital signs in last 24 hours:  BP 164/65  Pulse 96  Temp(Src) 98.4 F (36.9 C) (Oral)  Resp 18  Ht 5\' 4"  (1.626 m)  Wt 160 lb 8 oz (72.802 kg)  BMI 27.54 kg/m2  Weight is up 5 lbs.  Alert, oriented and appropriate. Ambulatory without difficulty.  Alopecia  HEENT:PERRL, sclerae not icteric. Oral mucosa moist without lesions, posterior pharynx clear. No nasal congestion Neck supple. No JVD.  Lymphatics:no cervical,suraclavicular or inguinal adenopathy Resp: clear to auscultation bilaterally and normal percussion bilaterally Cardio: regular rate and rhythm. No gallop. GI: soft, nontender, not distended, no mass or organomegaly. Normally active bowel sounds.  Surgical incision not remarkable. Musculoskeletal/ Extremities: trace pedal edema without cords, tenderness Neuro: peripheral neuropathy mid feet only. Otherwise nonfocal. PSYCH appropriate mood and affect Skin without rash, ecchymosis, petechiae Portacath-without erythema or  tenderness  Lab Results:  Results for orders placed in visit on 06/05/14  CBC WITH DIFFERENTIAL      Result Value Ref Range   WBC 3.0 (*) 3.9 - 10.3 10e3/uL   NEUT# 2.5  1.5 - 6.5 10e3/uL   HGB 7.3 (*) 11.6 - 15.9 g/dL   HCT 21.2 (*) 34.8 - 46.6 %   Platelets 155  145 - 400 10e3/uL   MCV 101.9 (*) 79.5 - 101.0 fL   MCH 35.1 (*) 25.1 - 34.0 pg   MCHC 34.4  31.5 - 36.0 g/dL   RBC 2.08 (*) 3.70 - 5.45 10e6/uL   RDW 18.7 (*) 11.2 - 14.5 %   lymph# 0.5 (*) 0.9 - 3.3 10e3/uL   MONO# 0.0 (*) 0.1 - 0.9 10e3/uL   Eosinophils Absolute 0.0  0.0 - 0.5 10e3/uL   Basophils Absolute 0.0  0.0 - 0.1 10e3/uL   NEUT% 83.1 (*) 38.4 - 76.8 %   LYMPH% 15.3  14.0 - 49.7 %   MONO% 1.3  0.0 - 14.0 %   EOS% 0.0  0.0 - 7.0 %   BASO% 0.3  0.0 - 2.0 %  COMPREHENSIVE METABOLIC PANEL (HE52)      Result Value Ref Range   Sodium 137  136 - 145 mEq/L   Potassium 4.2  3.5 - 5.1 mEq/L   Chloride 104  98 - 109 mEq/L   CO2 23  22 - 29 mEq/L   Glucose 222 (*) 70 - 140 mg/dl   BUN 24.3  7.0 - 26.0 mg/dL   Creatinine 1.1  0.6 - 1.1 mg/dL   Total Bilirubin 0.50  0.20 - 1.20 mg/dL   Alkaline Phosphatase 50  40 - 150 U/L   AST 16  5 - 34 U/L   ALT 13  0 - 55 U/L   Total Protein 6.1 (*) 6.4 - 8.3 g/dL   Albumin 3.5  3.5 - 5.0 g/dL   Calcium 9.3  8.4 - 10.4 mg/dL   Anion Gap 10  3 - 11 mEq/L   iron studies available after visit serum iron 76 and %sat 26, ferritin 610 Hemoccult cards given  Studies/Results:  No results found.  Medications: I have reviewed the patient's current medications.  DISCUSSION: will give day 8 cycle 5 taxol today and neupogen 10-20. She will be transfused 2 units PRBCs hopefully today and/or tomorrow. I will see her back on 10-26 with day 15 cycle 5 then as long as ANC >=~ 1.5 and plt >=100k.  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 Day 8 cycle 5 today. Weekly counts and chemistries, MD in 10-26. 2.progressive anemia thought chemo related: symptomatic at 7.3 today, agrees to 2 units PRBCs. On oral iron x 2 weeks most recently. Hemoccults pending. MCV elevated may be chemo but will check B12 (unable to get that order into EMR, message sent to lab manager) 3.steroid induced hyperglycemia: IVF with chemo is NS at 150 cc/hr with SS regular insulin coverage  4.thickening left breast on PE with history of some finding on that side previously: reportedly has been followed and  she is up to date on mammogreams, last in [place]  5.creatinine back to baseline 6..hypertension: appreciate adjustment in medications by Dr Mancel Bale. Elevated cholesterol  7.colon polyps: due colonoscopy fall 2015  8.social: Husband with dementia, patient is primary caregiver  9.PAC in  10.flu vaccine done    Consent obtained for blood transfusion. All questions answered. Chemo and gCSF orders confirmed. Time spent 30 min including >50% counseling and coordination of care.    Eliabeth Shoff P, MD   06/05/2014, 2:43 PM

## 2014-06-05 NOTE — Patient Instructions (Signed)
[facility] Discharge Instructions for Patients Receiving Chemotherapy  Today you received the following chemotherapy agents Paclitaxel.  To help prevent nausea and vomiting after your treatment, we encourage you to take your nausea medication as directed.    If you develop nausea and vomiting that is not controlled by your nausea medication, call the clinic.   BELOW ARE SYMPTOMS THAT SHOULD BE REPORTED IMMEDIATELY:  *FEVER GREATER THAN 100.5 F  *CHILLS WITH OR WITHOUT FEVER  NAUSEA AND VOMITING THAT IS NOT CONTROLLED WITH YOUR NAUSEA MEDICATION  *UNUSUAL SHORTNESS OF BREATH  *UNUSUAL BRUISING OR BLEEDING  TENDERNESS IN MOUTH AND THROAT WITH OR WITHOUT PRESENCE OF ULCERS  *URINARY PROBLEMS  *BOWEL PROBLEMS  UNUSUAL RASH Items with * indicate a potential emergency and should be followed up as soon as possible.  Feel free to call the clinic you have any questions or concerns. The clinic phone number is [phone].    

## 2014-06-05 NOTE — Telephone Encounter (Signed)
, °

## 2014-06-06 ENCOUNTER — Ambulatory Visit (HOSPITAL_BASED_OUTPATIENT_CLINIC_OR_DEPARTMENT_OTHER): Payer: Medicare Other

## 2014-06-06 ENCOUNTER — Ambulatory Visit: Payer: Medicare Other

## 2014-06-06 VITALS — BP 162/73 | HR 78 | Temp 98.2°F | Resp 16

## 2014-06-06 DIAGNOSIS — D630 Anemia in neoplastic disease: Secondary | ICD-10-CM | POA: Diagnosis not present

## 2014-06-06 DIAGNOSIS — D6481 Anemia due to antineoplastic chemotherapy: Secondary | ICD-10-CM

## 2014-06-06 DIAGNOSIS — C5702 Malignant neoplasm of left fallopian tube: Secondary | ICD-10-CM

## 2014-06-06 DIAGNOSIS — Z5189 Encounter for other specified aftercare: Secondary | ICD-10-CM

## 2014-06-06 DIAGNOSIS — T451X5A Adverse effect of antineoplastic and immunosuppressive drugs, initial encounter: Secondary | ICD-10-CM

## 2014-06-06 DIAGNOSIS — D539 Nutritional anemia, unspecified: Secondary | ICD-10-CM

## 2014-06-06 LAB — IRON AND TIBC CHCC
%SAT: 26 % (ref 21–57)
IRON: 76 ug/dL (ref 41–142)
TIBC: 294 ug/dL (ref 236–444)
UIBC: 218 ug/dL (ref 120–384)

## 2014-06-06 LAB — FERRITIN CHCC: Ferritin: 610 ng/ml — ABNORMAL HIGH (ref 9–269)

## 2014-06-06 LAB — HOLD TUBE, BLOOD BANK

## 2014-06-06 MED ORDER — TBO-FILGRASTIM 300 MCG/0.5ML ~~LOC~~ SOSY
300.0000 ug | PREFILLED_SYRINGE | Freq: Once | SUBCUTANEOUS | Status: AC
  Administered 2014-06-06: 300 ug via SUBCUTANEOUS
  Filled 2014-06-06: qty 0.5

## 2014-06-06 MED ORDER — ACETAMINOPHEN 325 MG PO TABS
325.0000 mg | ORAL_TABLET | Freq: Once | ORAL | Status: AC
  Administered 2014-06-06: 325 mg via ORAL

## 2014-06-06 MED ORDER — SODIUM CHLORIDE 0.9 % IJ SOLN
10.0000 mL | INTRAMUSCULAR | Status: AC | PRN
  Administered 2014-06-06: 10 mL
  Filled 2014-06-06: qty 10

## 2014-06-06 MED ORDER — HEPARIN SOD (PORK) LOCK FLUSH 100 UNIT/ML IV SOLN
250.0000 [IU] | INTRAVENOUS | Status: AC | PRN
  Administered 2014-06-06: 500 [IU]
  Filled 2014-06-06: qty 5

## 2014-06-06 MED ORDER — SODIUM CHLORIDE 0.9 % IV SOLN
250.0000 mL | Freq: Once | INTRAVENOUS | Status: AC
  Administered 2014-06-06: 250 mL via INTRAVENOUS

## 2014-06-06 MED ORDER — ACETAMINOPHEN 325 MG PO TABS
ORAL_TABLET | ORAL | Status: AC
  Filled 2014-06-06: qty 1

## 2014-06-06 NOTE — Progress Notes (Signed)
Patient in infusion receiving blood.  Infusion nurse to give granix

## 2014-06-06 NOTE — Patient Instructions (Signed)

## 2014-06-07 LAB — TYPE AND SCREEN
ABO/RH(D): A NEG
Antibody Screen: NEGATIVE
UNIT DIVISION: 0
Unit division: 0

## 2014-06-08 ENCOUNTER — Telehealth: Payer: Self-pay

## 2014-06-08 NOTE — Telephone Encounter (Signed)
Left message stating that Dr. Marko Plume wants her to take ferrous fumarate ~324 mg on an empty stomach with OJ or a Vitamin-C tab of 500 mg.  This is OTC.  An empty stomach is 1 hour prior to eating or 2 hours after eating. She can call the office back if she has any further questions.

## 2014-06-09 ENCOUNTER — Encounter: Payer: Self-pay | Admitting: *Deleted

## 2014-06-09 ENCOUNTER — Other Ambulatory Visit: Payer: Self-pay | Admitting: *Deleted

## 2014-06-09 DIAGNOSIS — T451X5A Adverse effect of antineoplastic and immunosuppressive drugs, initial encounter: Secondary | ICD-10-CM

## 2014-06-09 DIAGNOSIS — G620 Drug-induced polyneuropathy: Secondary | ICD-10-CM | POA: Insufficient documentation

## 2014-06-12 ENCOUNTER — Ambulatory Visit: Payer: Medicare Other | Admitting: Oncology

## 2014-06-12 ENCOUNTER — Other Ambulatory Visit: Payer: Medicare Other

## 2014-06-12 ENCOUNTER — Ambulatory Visit: Payer: Medicare Other

## 2014-06-12 ENCOUNTER — Other Ambulatory Visit (HOSPITAL_BASED_OUTPATIENT_CLINIC_OR_DEPARTMENT_OTHER): Payer: Medicare Other

## 2014-06-12 ENCOUNTER — Ambulatory Visit (HOSPITAL_BASED_OUTPATIENT_CLINIC_OR_DEPARTMENT_OTHER): Payer: Medicare Other

## 2014-06-12 VITALS — BP 166/61 | HR 98 | Temp 98.3°F | Resp 18

## 2014-06-12 DIAGNOSIS — R739 Hyperglycemia, unspecified: Secondary | ICD-10-CM

## 2014-06-12 DIAGNOSIS — C5702 Malignant neoplasm of left fallopian tube: Secondary | ICD-10-CM

## 2014-06-12 DIAGNOSIS — Z95828 Presence of other vascular implants and grafts: Secondary | ICD-10-CM

## 2014-06-12 DIAGNOSIS — Z5111 Encounter for antineoplastic chemotherapy: Secondary | ICD-10-CM

## 2014-06-12 LAB — CBC WITH DIFFERENTIAL/PLATELET
BASO%: 0.3 % (ref 0.0–2.0)
Basophils Absolute: 0 10*3/uL (ref 0.0–0.1)
EOS%: 0 % (ref 0.0–7.0)
Eosinophils Absolute: 0 10*3/uL (ref 0.0–0.5)
HCT: 31.1 % — ABNORMAL LOW (ref 34.8–46.6)
HGB: 10.3 g/dL — ABNORMAL LOW (ref 11.6–15.9)
LYMPH%: 15.5 % (ref 14.0–49.7)
MCH: 33.9 pg (ref 25.1–34.0)
MCHC: 33 g/dL (ref 31.5–36.0)
MCV: 102.6 fL — AB (ref 79.5–101.0)
MONO#: 0 10*3/uL — ABNORMAL LOW (ref 0.1–0.9)
MONO%: 0.9 % (ref 0.0–14.0)
NEUT#: 2 10*3/uL (ref 1.5–6.5)
NEUT%: 83.3 % — ABNORMAL HIGH (ref 38.4–76.8)
Platelets: 150 10*3/uL (ref 145–400)
RBC: 3.03 10*6/uL — AB (ref 3.70–5.45)
RDW: 21.3 % — ABNORMAL HIGH (ref 11.2–14.5)
WBC: 2.5 10*3/uL — ABNORMAL LOW (ref 3.9–10.3)
lymph#: 0.4 10*3/uL — ABNORMAL LOW (ref 0.9–3.3)

## 2014-06-12 LAB — COMPREHENSIVE METABOLIC PANEL (CC13)
ALBUMIN: 3.7 g/dL (ref 3.5–5.0)
ALT: 16 U/L (ref 0–55)
ANION GAP: 12 meq/L — AB (ref 3–11)
AST: 16 U/L (ref 5–34)
Alkaline Phosphatase: 56 U/L (ref 40–150)
BUN: 25.7 mg/dL (ref 7.0–26.0)
CALCIUM: 9.7 mg/dL (ref 8.4–10.4)
CO2: 21 mEq/L — ABNORMAL LOW (ref 22–29)
Chloride: 105 mEq/L (ref 98–109)
Creatinine: 1.1 mg/dL (ref 0.6–1.1)
Glucose: 321 mg/dl — ABNORMAL HIGH (ref 70–140)
Potassium: 4.2 mEq/L (ref 3.5–5.1)
SODIUM: 138 meq/L (ref 136–145)
Total Bilirubin: 0.55 mg/dL (ref 0.20–1.20)
Total Protein: 6.3 g/dL — ABNORMAL LOW (ref 6.4–8.3)

## 2014-06-12 MED ORDER — DEXTROSE 5 % IV SOLN
80.0000 mg/m2 | Freq: Once | INTRAVENOUS | Status: AC
  Administered 2014-06-12: 138 mg via INTRAVENOUS
  Filled 2014-06-12: qty 23

## 2014-06-12 MED ORDER — DEXAMETHASONE SODIUM PHOSPHATE 20 MG/5ML IJ SOLN
20.0000 mg | Freq: Once | INTRAMUSCULAR | Status: AC
  Administered 2014-06-12: 20 mg via INTRAVENOUS

## 2014-06-12 MED ORDER — SODIUM CHLORIDE 0.9 % IJ SOLN
10.0000 mL | INTRAMUSCULAR | Status: DC | PRN
  Administered 2014-06-12: 10 mL via INTRAVENOUS
  Filled 2014-06-12: qty 10

## 2014-06-12 MED ORDER — DIPHENHYDRAMINE HCL 50 MG/ML IJ SOLN
25.0000 mg | Freq: Once | INTRAMUSCULAR | Status: AC
  Administered 2014-06-12: 25 mg via INTRAVENOUS

## 2014-06-12 MED ORDER — FAMOTIDINE IN NACL 20-0.9 MG/50ML-% IV SOLN
20.0000 mg | Freq: Once | INTRAVENOUS | Status: AC
  Administered 2014-06-12: 20 mg via INTRAVENOUS

## 2014-06-12 MED ORDER — HEPARIN SOD (PORK) LOCK FLUSH 100 UNIT/ML IV SOLN
500.0000 [IU] | Freq: Once | INTRAVENOUS | Status: AC | PRN
  Administered 2014-06-12: 500 [IU]
  Filled 2014-06-12: qty 5

## 2014-06-12 MED ORDER — SODIUM CHLORIDE 0.9 % IV SOLN
Freq: Once | INTRAVENOUS | Status: AC
  Administered 2014-06-12: 11:00:00 via INTRAVENOUS

## 2014-06-12 MED ORDER — ONDANSETRON 8 MG/50ML IVPB (CHCC)
8.0000 mg | Freq: Once | INTRAVENOUS | Status: AC
  Administered 2014-06-12: 8 mg via INTRAVENOUS

## 2014-06-12 MED ORDER — INSULIN REGULAR HUMAN 100 UNIT/ML IJ SOLN
2.0000 [IU] | Freq: Once | INTRAMUSCULAR | Status: AC | PRN
  Administered 2014-06-12: 2 [IU] via SUBCUTANEOUS
  Filled 2014-06-12: qty 0.02

## 2014-06-12 MED ORDER — DEXAMETHASONE SODIUM PHOSPHATE 20 MG/5ML IJ SOLN
INTRAMUSCULAR | Status: AC
  Filled 2014-06-12: qty 5

## 2014-06-12 MED ORDER — DIPHENHYDRAMINE HCL 50 MG/ML IJ SOLN
INTRAMUSCULAR | Status: AC
  Filled 2014-06-12: qty 1

## 2014-06-12 MED ORDER — FAMOTIDINE IN NACL 20-0.9 MG/50ML-% IV SOLN
INTRAVENOUS | Status: AC
  Filled 2014-06-12: qty 50

## 2014-06-12 MED ORDER — SODIUM CHLORIDE 0.9 % IJ SOLN
10.0000 mL | INTRAMUSCULAR | Status: DC | PRN
  Administered 2014-06-12: 10 mL
  Filled 2014-06-12: qty 10

## 2014-06-12 MED ORDER — ONDANSETRON 8 MG/NS 50 ML IVPB
INTRAVENOUS | Status: AC
  Filled 2014-06-12: qty 8

## 2014-06-12 NOTE — Patient Instructions (Signed)
[facility] Discharge Instructions for Patients Receiving Chemotherapy  Today you received the following chemotherapy agents: Taxol.  To help prevent nausea and vomiting after your treatment, we encourage you to take your nausea medication as prescribed.   If you develop nausea and vomiting that is not controlled by your nausea medication, call the clinic.   BELOW ARE SYMPTOMS THAT SHOULD BE REPORTED IMMEDIATELY:  *FEVER GREATER THAN 100.5 F  *CHILLS WITH OR WITHOUT FEVER  NAUSEA AND VOMITING THAT IS NOT CONTROLLED WITH YOUR NAUSEA MEDICATION  *UNUSUAL SHORTNESS OF BREATH  *UNUSUAL BRUISING OR BLEEDING  TENDERNESS IN MOUTH AND THROAT WITH OR WITHOUT PRESENCE OF ULCERS  *URINARY PROBLEMS  *BOWEL PROBLEMS  UNUSUAL RASH Items with * indicate a potential emergency and should be followed up as soon as possible.  Feel free to call the clinic you have any questions or concerns. The clinic phone number is [phone].    

## 2014-06-12 NOTE — Patient Instructions (Signed)

## 2014-06-16 ENCOUNTER — Ambulatory Visit: Payer: Medicare Other

## 2014-06-17 ENCOUNTER — Other Ambulatory Visit: Payer: Self-pay | Admitting: Oncology

## 2014-06-19 ENCOUNTER — Other Ambulatory Visit (HOSPITAL_BASED_OUTPATIENT_CLINIC_OR_DEPARTMENT_OTHER): Payer: Medicare Other

## 2014-06-19 ENCOUNTER — Encounter: Payer: Self-pay | Admitting: Oncology

## 2014-06-19 ENCOUNTER — Ambulatory Visit (HOSPITAL_BASED_OUTPATIENT_CLINIC_OR_DEPARTMENT_OTHER): Payer: Medicare Other | Admitting: Oncology

## 2014-06-19 ENCOUNTER — Ambulatory Visit (HOSPITAL_BASED_OUTPATIENT_CLINIC_OR_DEPARTMENT_OTHER): Payer: Medicare Other

## 2014-06-19 ENCOUNTER — Telehealth: Payer: Self-pay | Admitting: Oncology

## 2014-06-19 VITALS — BP 161/67 | HR 99 | Temp 98.3°F | Resp 18 | Ht 64.0 in | Wt 160.9 lb

## 2014-06-19 DIAGNOSIS — Z95828 Presence of other vascular implants and grafts: Secondary | ICD-10-CM

## 2014-06-19 DIAGNOSIS — C5702 Malignant neoplasm of left fallopian tube: Secondary | ICD-10-CM

## 2014-06-19 DIAGNOSIS — Z5111 Encounter for antineoplastic chemotherapy: Secondary | ICD-10-CM

## 2014-06-19 DIAGNOSIS — Z452 Encounter for adjustment and management of vascular access device: Secondary | ICD-10-CM

## 2014-06-19 DIAGNOSIS — I10 Essential (primary) hypertension: Secondary | ICD-10-CM

## 2014-06-19 DIAGNOSIS — D649 Anemia, unspecified: Secondary | ICD-10-CM

## 2014-06-19 LAB — COMPREHENSIVE METABOLIC PANEL (CC13)
ALT: 17 U/L (ref 0–55)
ANION GAP: 11 meq/L (ref 3–11)
AST: 13 U/L (ref 5–34)
Albumin: 3.6 g/dL (ref 3.5–5.0)
Alkaline Phosphatase: 50 U/L (ref 40–150)
BILIRUBIN TOTAL: 0.4 mg/dL (ref 0.20–1.20)
BUN: 17.4 mg/dL (ref 7.0–26.0)
CO2: 24 meq/L (ref 22–29)
Calcium: 9.6 mg/dL (ref 8.4–10.4)
Chloride: 107 mEq/L (ref 98–109)
Creatinine: 1 mg/dL (ref 0.6–1.1)
Glucose: 164 mg/dl — ABNORMAL HIGH (ref 70–140)
Potassium: 4 mEq/L (ref 3.5–5.1)
SODIUM: 142 meq/L (ref 136–145)
Total Protein: 6.4 g/dL (ref 6.4–8.3)

## 2014-06-19 LAB — CBC WITH DIFFERENTIAL/PLATELET
BASO%: 0.4 % (ref 0.0–2.0)
Basophils Absolute: 0 10*3/uL (ref 0.0–0.1)
EOS%: 0 % (ref 0.0–7.0)
Eosinophils Absolute: 0 10*3/uL (ref 0.0–0.5)
HCT: 29.3 % — ABNORMAL LOW (ref 34.8–46.6)
HGB: 9.8 g/dL — ABNORMAL LOW (ref 11.6–15.9)
LYMPH%: 21.6 % (ref 14.0–49.7)
MCH: 34.7 pg — ABNORMAL HIGH (ref 25.1–34.0)
MCHC: 33.5 g/dL (ref 31.5–36.0)
MCV: 103.4 fL — ABNORMAL HIGH (ref 79.5–101.0)
MONO#: 0 10*3/uL — AB (ref 0.1–0.9)
MONO%: 1.2 % (ref 0.0–14.0)
NEUT#: 2.1 10*3/uL (ref 1.5–6.5)
NEUT%: 76.8 % (ref 38.4–76.8)
Platelets: 204 10*3/uL (ref 145–400)
RBC: 2.83 10*6/uL — ABNORMAL LOW (ref 3.70–5.45)
RDW: 21.9 % — ABNORMAL HIGH (ref 11.2–14.5)
WBC: 2.7 10*3/uL — AB (ref 3.9–10.3)
lymph#: 0.6 10*3/uL — ABNORMAL LOW (ref 0.9–3.3)

## 2014-06-19 MED ORDER — SODIUM CHLORIDE 0.9 % IV SOLN
Freq: Once | INTRAVENOUS | Status: AC
  Administered 2014-06-19: 11:00:00 via INTRAVENOUS

## 2014-06-19 MED ORDER — SODIUM CHLORIDE 0.9 % IJ SOLN
10.0000 mL | INTRAMUSCULAR | Status: DC | PRN
  Filled 2014-06-19: qty 10

## 2014-06-19 MED ORDER — FAMOTIDINE IN NACL 20-0.9 MG/50ML-% IV SOLN
20.0000 mg | Freq: Once | INTRAVENOUS | Status: AC
  Administered 2014-06-19: 20 mg via INTRAVENOUS

## 2014-06-19 MED ORDER — SODIUM CHLORIDE 0.9 % IJ SOLN
10.0000 mL | INTRAMUSCULAR | Status: DC | PRN
  Administered 2014-06-19: 10 mL via INTRAVENOUS
  Filled 2014-06-19: qty 10

## 2014-06-19 MED ORDER — HEPARIN SOD (PORK) LOCK FLUSH 100 UNIT/ML IV SOLN
500.0000 [IU] | Freq: Once | INTRAVENOUS | Status: AC
  Administered 2014-06-19: 500 [IU] via INTRAVENOUS
  Filled 2014-06-19: qty 5

## 2014-06-19 MED ORDER — PACLITAXEL CHEMO INJECTION 300 MG/50ML
80.0000 mg/m2 | Freq: Once | INTRAVENOUS | Status: AC
  Administered 2014-06-19: 138 mg via INTRAVENOUS
  Filled 2014-06-19: qty 23

## 2014-06-19 MED ORDER — HEPARIN SOD (PORK) LOCK FLUSH 100 UNIT/ML IV SOLN
500.0000 [IU] | Freq: Once | INTRAVENOUS | Status: DC | PRN
  Filled 2014-06-19: qty 5

## 2014-06-19 MED ORDER — SODIUM CHLORIDE 0.9 % IV SOLN
380.0000 mg | Freq: Once | INTRAVENOUS | Status: AC
  Administered 2014-06-19: 380 mg via INTRAVENOUS
  Filled 2014-06-19: qty 38

## 2014-06-19 MED ORDER — FAMOTIDINE IN NACL 20-0.9 MG/50ML-% IV SOLN
INTRAVENOUS | Status: AC
  Filled 2014-06-19: qty 50

## 2014-06-19 NOTE — Patient Instructions (Signed)
[facility] Discharge Instructions for Patients Receiving Chemotherapy  Today you received the following chemotherapy agents Taxol, Carboplatin  To help prevent nausea and vomiting after your treatment, we encourage you to take your nausea medication as directed.   If you develop nausea and vomiting that is not controlled by your nausea medication, call the clinic.   BELOW ARE SYMPTOMS THAT SHOULD BE REPORTED IMMEDIATELY:  *FEVER GREATER THAN 100.5 F  *CHILLS WITH OR WITHOUT FEVER  NAUSEA AND VOMITING THAT IS NOT CONTROLLED WITH YOUR NAUSEA MEDICATION  *UNUSUAL SHORTNESS OF BREATH  *UNUSUAL BRUISING OR BLEEDING  TENDERNESS IN MOUTH AND THROAT WITH OR WITHOUT PRESENCE OF ULCERS  *URINARY PROBLEMS  *BOWEL PROBLEMS  UNUSUAL RASH Items with * indicate a potential emergency and should be followed up as soon as possible.  Feel free to call the clinic you have any questions or concerns. The clinic phone number is [phone].    

## 2014-06-19 NOTE — Progress Notes (Signed)
OFFICE PROGRESS NOTE   06/19/2014   Physicians:Brewster, Abigail Butts, MD, [name]; (PCP)   INTERVAL HISTORY:  Patient is seen, alone for visit, in scheduled follow up of adjuvant chemotherapy with dose dense carbo taxol for IIIA left fallopian carcinoma, due day 1 cycle 6 today. We plan restaging scans and follow up with gyn oncology after this cycle is completed. She felt much better after transfusion of 2 units PRBCs on 06-06-14 for Hgb 7.3. She would be appropriate for GOG weight loss/exercise study if follow up scans good (6 wks - 6 mo after completion of adjuvant chemo).  She denies SOB since PRBC transfusion. She has not seen bleeding, did not do hemoccult cards as requested and those have been given again.She has resumed ferrous gluconate, which she is taking with prune juice (Recommended with OJ).She denies any increase in peripheral neuropathy. Bowels moving more regularly with addition of prune juice. No problems with PAC. No abdominal or pelvic discomfort. Slight pedal edema since flight back from [facility], no pain in LE, improves with elevation.  Patient is just back from trip to [place], where she celebrated 46th wedding anniversary and found a condo. She wants to return to [place] ASAP after completes chemotherapy, then will be back in [place] week of Christmas.   She has PAC. She had flu vaccine  ONCOLOGIC HISTORY Oncology History   LEFT     Fallopian tube cancer, carcinoma   01/05/2014 Initial Diagnosis Fallopian tube cancer, carcinoma LEFT  Patient had been in usual good health until acute abdominal pain for which she was seen in ED in [place] on 01-02-14 and admitted to [facility]. Per patient, CT scans were done, tho I do not have those reports; laboratory results also do not accompany (pre op CA125 not known). She was taken to optimal debulking by Dr Milana Kidney on 01-05-14 (hysterectomy, BSO, PPLND,omentectomy, pelvic peritonectomy and appendectomy. 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 No washings reported in this information. Pathologic stage pT3aN0 (stage IIIA). She was discharged home on 01-11-14. She was doing well from the surgery when seen by Dr James Ivanoff on 01-18-14, and has contiued to recover from that without problems. She was not on home lovenox.  Start of treatment was delayed as patient moved back to [place] after follow up visit with Dr Robley Fries on 01-18-14. She saw Dr Skeet Latch in consultation on 02-09-14, also with recommendation for adjuvant chemotherapy, which began with dose dense taxol carboplatin on 02-28-14. She was neutropenic by day 15 cycle 1, with that treatment delayed x 1 week and gCSF added.  Review of systems as above, also: No fever or symptoms of infection. Energy good now. Appetite good Remainder of 10 point Review of Systems negative.  Objective:  Vital signs in last 24 hours: weight 160 lb/14 oz, 161/67, 99 regular, 18 not labored RA, 98.3  Looks comfortable, talkative, in good spirits (also on steroids) Alert, oriented and appropriate. Ambulatory without difficulty.  Alopecia  HEENT:PERRL, sclerae not icteric. Oral mucosa moist without lesions, posterior pharynx clear.  Neck supple. No JVD.  Lymphatics:no cervical,suraclavicular, axillary or inguinal adenopathy  Resp: clear to auscultation bilaterally and normal percussion bilaterally Cardio: regular rate and rhythm. No gallop. GI: soft, nontender, not distended, no mass or organomegaly.  Normally active bowel sounds. Surgical incision not remarkable. Musculoskeletal/ Extremities: without pitting edema, cords, tenderness Neuro: nochange minimal peripheral neuropathy. Otherwise nonfocal Skin without rash, ecchymosis, petechiae Portacath-without erythema or tenderness  Lab Results:  Results for orders placed or performed in visit on 06/12/14  CBC with Differential  Result Value Ref Range   WBC 2.5 (L) 3.9 - 10.3 10e3/uL   NEUT# 2.0 1.5 - 6.5 10e3/uL   HGB 10.3 (L) 11.6 - 15.9 g/dL   HCT 31.1 (L) 34.8 - 46.6 %   Platelets 150 145 - 400 10e3/uL   MCV 102.6 (H) 79.5 - 101.0 fL   MCH 33.9 25.1 - 34.0 pg   MCHC 33.0 31.5 - 36.0 g/dL   RBC 3.03 (L) 3.70 - 5.45 10e6/uL   RDW 21.3 (H) 11.2 - 14.5 %   lymph# 0.4 (L) 0.9 - 3.3 10e3/uL   MONO# 0.0 (L) 0.1 - 0.9 10e3/uL   Eosinophils Absolute 0.0 0.0 - 0.5 10e3/uL   Basophils Absolute 0.0 0.0 - 0.1 10e3/uL   NEUT% 83.3 (H) 38.4 - 76.8 %   LYMPH% 15.5 14.0 - 49.7 %   MONO% 0.9 0.0 - 14.0 %   EOS% 0.0 0.0 - 7.0 %   BASO% 0.3 0.0 - 2.0 %  Comprehensive metabolic panel (Cmet) - [facility]  Result Value Ref Range   Sodium 138 136 - 145 mEq/L   Potassium 4.2 3.5 - 5.1 mEq/L   Chloride 105 98 - 109 mEq/L   CO2 21 (L) 22 - 29 mEq/L   Glucose 321 (H) 70 - 140 mg/dl   BUN 25.7 7.0 - 26.0 mg/dL   Creatinine 1.1 0.6 - 1.1 mg/dL   Total Bilirubin 0.55 0.20 - 1.20 mg/dL   Alkaline Phosphatase 56 40 - 150 U/L   AST 16 5 - 34 U/L   ALT 16 0 - 55 U/L   Total Protein 6.3 (L) 6.4 - 8.3 g/dL   Albumin 3.7 3.5 - 5.0 g/dL   Calcium 9.7 8.4 - 10.4 mg/dL   Anion Gap 12 (H) 3 - 11 mEq/L    CA 125  8 in Aug 2015  Studies/Results:  No results found.  Medications: I have reviewed the patient's current medications. 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 Day 1 cycle 6 today, treat with day 8 on 11-9 if ANC >=1.5 and plt >=100k and I will see her back with day 15 on 07-03-14. She needs CT AP and follow up with Dr Skeet Latch after chemo completes. She may be eligible for GOG 225 diet and exercise study (not discussed today) 2.progressive anemia thought chemo related: improved with transfusion 06-06-14, continuing iron. Iron studies not markedly low, hemoccults still pending. 3.steroid induced hyperglycemia: IVF with chemo is NS at 150 cc/hr with SS regular insulin coverage  4.thickening left breast on PE with history of some finding on that side previously: reportedly has been followed and she is up to date on mammogreams, last in [place]. She may be able to have next mammograms at same facility in [place] for continuity. 5.creatinine back to baseline 6..hypertension: appreciate adjustment in medications by Dr Mancel Bale. Elevated cholesterol  7.colon polyps: due colonoscopy fall 2015 per patient's initial history 8.social: Husband with dementia, patient is primary caregiver  9.PAC in: will need flush every 6-8 weeks when not otherwise used. 10.flu vaccine done    All questions answered. Discussed timing of scans/ gyn onc with gyn onc NP. Chemo and gCSF orders confirmed. Time spent 25 min including >50% counseling and coordination of care    Joanne Copeland P, MD   06/19/2014, 9:49 AM

## 2014-06-19 NOTE — Telephone Encounter (Signed)
, °

## 2014-06-19 NOTE — Patient Instructions (Signed)

## 2014-06-20 ENCOUNTER — Telehealth: Payer: Self-pay | Admitting: Oncology

## 2014-06-20 ENCOUNTER — Ambulatory Visit (HOSPITAL_BASED_OUTPATIENT_CLINIC_OR_DEPARTMENT_OTHER): Payer: Medicare Other

## 2014-06-20 DIAGNOSIS — Z5189 Encounter for other specified aftercare: Secondary | ICD-10-CM

## 2014-06-20 DIAGNOSIS — C5702 Malignant neoplasm of left fallopian tube: Secondary | ICD-10-CM

## 2014-06-20 MED ORDER — TBO-FILGRASTIM 300 MCG/0.5ML ~~LOC~~ SOSY
300.0000 ug | PREFILLED_SYRINGE | Freq: Once | SUBCUTANEOUS | Status: AC
  Administered 2014-06-20: 300 ug via SUBCUTANEOUS
  Filled 2014-06-20: qty 0.5

## 2014-06-20 NOTE — Patient Instructions (Signed)

## 2014-06-20 NOTE — Telephone Encounter (Signed)
per pt moved 11/9 appt to PM and added lab to inf appt. lab added per 10/19 pof. pt given new schedule. all other appts remain the same.

## 2014-06-26 ENCOUNTER — Other Ambulatory Visit (HOSPITAL_BASED_OUTPATIENT_CLINIC_OR_DEPARTMENT_OTHER): Payer: Medicare Other

## 2014-06-26 ENCOUNTER — Ambulatory Visit (HOSPITAL_BASED_OUTPATIENT_CLINIC_OR_DEPARTMENT_OTHER): Payer: Medicare Other

## 2014-06-26 ENCOUNTER — Ambulatory Visit: Payer: Medicare Other

## 2014-06-26 VITALS — BP 152/67 | HR 101 | Temp 98.1°F

## 2014-06-26 DIAGNOSIS — Z5111 Encounter for antineoplastic chemotherapy: Secondary | ICD-10-CM

## 2014-06-26 DIAGNOSIS — C5702 Malignant neoplasm of left fallopian tube: Secondary | ICD-10-CM

## 2014-06-26 DIAGNOSIS — Z95828 Presence of other vascular implants and grafts: Secondary | ICD-10-CM

## 2014-06-26 DIAGNOSIS — D539 Nutritional anemia, unspecified: Secondary | ICD-10-CM

## 2014-06-26 DIAGNOSIS — D6481 Anemia due to antineoplastic chemotherapy: Secondary | ICD-10-CM

## 2014-06-26 DIAGNOSIS — T451X5A Adverse effect of antineoplastic and immunosuppressive drugs, initial encounter: Secondary | ICD-10-CM

## 2014-06-26 DIAGNOSIS — D630 Anemia in neoplastic disease: Secondary | ICD-10-CM

## 2014-06-26 LAB — COMPREHENSIVE METABOLIC PANEL (CC13)
ALT: 19 U/L (ref 0–55)
ANION GAP: 13 meq/L — AB (ref 3–11)
AST: 21 U/L (ref 5–34)
Albumin: 3.9 g/dL (ref 3.5–5.0)
Alkaline Phosphatase: 54 U/L (ref 40–150)
BUN: 23.5 mg/dL (ref 7.0–26.0)
CALCIUM: 9.7 mg/dL (ref 8.4–10.4)
CHLORIDE: 103 meq/L (ref 98–109)
CO2: 23 meq/L (ref 22–29)
Creatinine: 1 mg/dL (ref 0.6–1.1)
Glucose: 222 mg/dl — ABNORMAL HIGH (ref 70–140)
POTASSIUM: 4.1 meq/L (ref 3.5–5.1)
SODIUM: 138 meq/L (ref 136–145)
TOTAL PROTEIN: 6.7 g/dL (ref 6.4–8.3)
Total Bilirubin: 0.39 mg/dL (ref 0.20–1.20)

## 2014-06-26 LAB — CBC WITH DIFFERENTIAL/PLATELET
BASO%: 0.3 % (ref 0.0–2.0)
Basophils Absolute: 0 10*3/uL (ref 0.0–0.1)
EOS%: 0.1 % (ref 0.0–7.0)
Eosinophils Absolute: 0 10*3/uL (ref 0.0–0.5)
HCT: 28 % — ABNORMAL LOW (ref 34.8–46.6)
HGB: 9.4 g/dL — ABNORMAL LOW (ref 11.6–15.9)
LYMPH#: 0.6 10*3/uL — AB (ref 0.9–3.3)
LYMPH%: 17.4 % (ref 14.0–49.7)
MCH: 34.9 pg — AB (ref 25.1–34.0)
MCHC: 33.5 g/dL (ref 31.5–36.0)
MCV: 104.1 fL — ABNORMAL HIGH (ref 79.5–101.0)
MONO#: 0.1 10*3/uL (ref 0.1–0.9)
MONO%: 2.7 % (ref 0.0–14.0)
NEUT#: 2.5 10*3/uL (ref 1.5–6.5)
NEUT%: 79.5 % — ABNORMAL HIGH (ref 38.4–76.8)
Platelets: 268 10*3/uL (ref 145–400)
RBC: 2.68 10*6/uL — ABNORMAL LOW (ref 3.70–5.45)
RDW: 20.7 % — AB (ref 11.2–14.5)
WBC: 3.2 10*3/uL — ABNORMAL LOW (ref 3.9–10.3)

## 2014-06-26 LAB — FECAL OCCULT BLOOD, GUAIAC: Occult Blood: NEGATIVE

## 2014-06-26 MED ORDER — HEPARIN SOD (PORK) LOCK FLUSH 100 UNIT/ML IV SOLN
500.0000 [IU] | Freq: Once | INTRAVENOUS | Status: AC | PRN
  Administered 2014-06-26: 500 [IU]
  Filled 2014-06-26: qty 5

## 2014-06-26 MED ORDER — ONDANSETRON 8 MG/NS 50 ML IVPB
INTRAVENOUS | Status: AC
  Filled 2014-06-26: qty 8

## 2014-06-26 MED ORDER — DIPHENHYDRAMINE HCL 50 MG/ML IJ SOLN
INTRAMUSCULAR | Status: AC
  Filled 2014-06-26: qty 1

## 2014-06-26 MED ORDER — DEXAMETHASONE SODIUM PHOSPHATE 20 MG/5ML IJ SOLN
INTRAMUSCULAR | Status: AC
  Filled 2014-06-26: qty 5

## 2014-06-26 MED ORDER — DEXAMETHASONE SODIUM PHOSPHATE 20 MG/5ML IJ SOLN
20.0000 mg | Freq: Once | INTRAMUSCULAR | Status: AC
  Administered 2014-06-26: 20 mg via INTRAVENOUS

## 2014-06-26 MED ORDER — FAMOTIDINE IN NACL 20-0.9 MG/50ML-% IV SOLN
INTRAVENOUS | Status: AC
  Filled 2014-06-26: qty 50

## 2014-06-26 MED ORDER — SODIUM CHLORIDE 0.9 % IJ SOLN
10.0000 mL | INTRAMUSCULAR | Status: DC | PRN
  Administered 2014-06-26: 10 mL via INTRAVENOUS
  Filled 2014-06-26: qty 10

## 2014-06-26 MED ORDER — FAMOTIDINE IN NACL 20-0.9 MG/50ML-% IV SOLN
20.0000 mg | Freq: Once | INTRAVENOUS | Status: AC
  Administered 2014-06-26: 20 mg via INTRAVENOUS

## 2014-06-26 MED ORDER — ONDANSETRON 8 MG/50ML IVPB (CHCC)
8.0000 mg | Freq: Once | INTRAVENOUS | Status: AC
  Administered 2014-06-26: 8 mg via INTRAVENOUS

## 2014-06-26 MED ORDER — PACLITAXEL CHEMO INJECTION 300 MG/50ML
80.0000 mg/m2 | Freq: Once | INTRAVENOUS | Status: AC
  Administered 2014-06-26: 138 mg via INTRAVENOUS
  Filled 2014-06-26: qty 23

## 2014-06-26 MED ORDER — INSULIN REGULAR HUMAN 100 UNIT/ML IJ SOLN: 2.0000 [IU] | Freq: Once | INTRAMUSCULAR | Status: DC | PRN

## 2014-06-26 MED ORDER — DIPHENHYDRAMINE HCL 50 MG/ML IJ SOLN
25.0000 mg | Freq: Once | INTRAMUSCULAR | Status: AC
  Administered 2014-06-26: 25 mg via INTRAVENOUS

## 2014-06-26 MED ORDER — SODIUM CHLORIDE 0.9 % IV SOLN
Freq: Once | INTRAVENOUS | Status: AC
  Administered 2014-06-26: 14:00:00 via INTRAVENOUS

## 2014-06-26 MED ORDER — HEPARIN SOD (PORK) LOCK FLUSH 100 UNIT/ML IV SOLN
500.0000 [IU] | Freq: Once | INTRAVENOUS | Status: DC
  Filled 2014-06-26: qty 5

## 2014-06-26 MED ORDER — SODIUM CHLORIDE 0.9 % IJ SOLN
10.0000 mL | INTRAMUSCULAR | Status: DC | PRN
  Administered 2014-06-26: 10 mL
  Filled 2014-06-26: qty 10

## 2014-06-26 NOTE — Patient Instructions (Signed)
[facility] Discharge Instructions for Patients Receiving Chemotherapy  Today you received the following chemotherapy agents: Taxol.  To help prevent nausea and vomiting after your treatment, we encourage you to take your nausea medication as prescribed.   If you develop nausea and vomiting that is not controlled by your nausea medication, call the clinic.   BELOW ARE SYMPTOMS THAT SHOULD BE REPORTED IMMEDIATELY:  *FEVER GREATER THAN 100.5 F  *CHILLS WITH OR WITHOUT FEVER  NAUSEA AND VOMITING THAT IS NOT CONTROLLED WITH YOUR NAUSEA MEDICATION  *UNUSUAL SHORTNESS OF BREATH  *UNUSUAL BRUISING OR BLEEDING  TENDERNESS IN MOUTH AND THROAT WITH OR WITHOUT PRESENCE OF ULCERS  *URINARY PROBLEMS  *BOWEL PROBLEMS  UNUSUAL RASH Items with * indicate a potential emergency and should be followed up as soon as possible.  Feel free to call the clinic you have any questions or concerns. The clinic phone number is [phone].    

## 2014-06-27 ENCOUNTER — Ambulatory Visit (HOSPITAL_BASED_OUTPATIENT_CLINIC_OR_DEPARTMENT_OTHER): Payer: Medicare Other

## 2014-06-27 DIAGNOSIS — Z5189 Encounter for other specified aftercare: Secondary | ICD-10-CM

## 2014-06-27 DIAGNOSIS — C5702 Malignant neoplasm of left fallopian tube: Secondary | ICD-10-CM

## 2014-06-27 MED ORDER — TBO-FILGRASTIM 300 MCG/0.5ML ~~LOC~~ SOSY
300.0000 ug | PREFILLED_SYRINGE | Freq: Once | SUBCUTANEOUS | Status: AC
  Administered 2014-06-27: 300 ug via SUBCUTANEOUS
  Filled 2014-06-27: qty 0.5

## 2014-06-27 NOTE — Patient Instructions (Signed)

## 2014-07-02 ENCOUNTER — Other Ambulatory Visit: Payer: Self-pay | Admitting: Oncology

## 2014-07-03 ENCOUNTER — Ambulatory Visit (HOSPITAL_BASED_OUTPATIENT_CLINIC_OR_DEPARTMENT_OTHER): Payer: Medicare Other

## 2014-07-03 ENCOUNTER — Other Ambulatory Visit (HOSPITAL_BASED_OUTPATIENT_CLINIC_OR_DEPARTMENT_OTHER): Payer: Medicare Other

## 2014-07-03 ENCOUNTER — Ambulatory Visit (HOSPITAL_BASED_OUTPATIENT_CLINIC_OR_DEPARTMENT_OTHER): Payer: Medicare Other | Admitting: Oncology

## 2014-07-03 ENCOUNTER — Encounter: Payer: Self-pay | Admitting: Oncology

## 2014-07-03 ENCOUNTER — Ambulatory Visit: Payer: Medicare Other

## 2014-07-03 VITALS — BP 163/67 | HR 94 | Temp 98.3°F | Resp 20 | Ht 64.0 in | Wt 161.8 lb

## 2014-07-03 DIAGNOSIS — D649 Anemia, unspecified: Secondary | ICD-10-CM

## 2014-07-03 DIAGNOSIS — C5702 Malignant neoplasm of left fallopian tube: Secondary | ICD-10-CM

## 2014-07-03 DIAGNOSIS — M7989 Other specified soft tissue disorders: Secondary | ICD-10-CM

## 2014-07-03 DIAGNOSIS — Z95828 Presence of other vascular implants and grafts: Secondary | ICD-10-CM

## 2014-07-03 DIAGNOSIS — Z5111 Encounter for antineoplastic chemotherapy: Secondary | ICD-10-CM

## 2014-07-03 DIAGNOSIS — I10 Essential (primary) hypertension: Secondary | ICD-10-CM

## 2014-07-03 LAB — COMPREHENSIVE METABOLIC PANEL (CC13)
ALK PHOS: 56 U/L (ref 40–150)
ALT: 15 U/L (ref 0–55)
AST: 17 U/L (ref 5–34)
Albumin: 3.7 g/dL (ref 3.5–5.0)
Anion Gap: 10 mEq/L (ref 3–11)
BUN: 20.3 mg/dL (ref 7.0–26.0)
CO2: 24 mEq/L (ref 22–29)
CREATININE: 1.1 mg/dL (ref 0.6–1.1)
Calcium: 9.5 mg/dL (ref 8.4–10.4)
Chloride: 104 mEq/L (ref 98–109)
GLUCOSE: 225 mg/dL — AB (ref 70–140)
Potassium: 3.9 mEq/L (ref 3.5–5.1)
Sodium: 138 mEq/L (ref 136–145)
Total Bilirubin: 0.41 mg/dL (ref 0.20–1.20)
Total Protein: 6.5 g/dL (ref 6.4–8.3)

## 2014-07-03 LAB — CBC WITH DIFFERENTIAL/PLATELET
BASO%: 0.4 % (ref 0.0–2.0)
BASOS ABS: 0 10*3/uL (ref 0.0–0.1)
EOS ABS: 0 10*3/uL (ref 0.0–0.5)
EOS%: 0 % (ref 0.0–7.0)
HCT: 27 % — ABNORMAL LOW (ref 34.8–46.6)
HGB: 9.2 g/dL — ABNORMAL LOW (ref 11.6–15.9)
LYMPH%: 20.6 % (ref 14.0–49.7)
MCH: 35 pg — AB (ref 25.1–34.0)
MCHC: 34.1 g/dL (ref 31.5–36.0)
MCV: 102.7 fL — AB (ref 79.5–101.0)
MONO#: 0.1 10*3/uL (ref 0.1–0.9)
MONO%: 3.1 % (ref 0.0–14.0)
NEUT#: 2 10*3/uL (ref 1.5–6.5)
NEUT%: 75.9 % (ref 38.4–76.8)
PLATELETS: 176 10*3/uL (ref 145–400)
RBC: 2.63 10*6/uL — ABNORMAL LOW (ref 3.70–5.45)
RDW: 19.2 % — ABNORMAL HIGH (ref 11.2–14.5)
WBC: 2.6 10*3/uL — ABNORMAL LOW (ref 3.9–10.3)
lymph#: 0.5 10*3/uL — ABNORMAL LOW (ref 0.9–3.3)

## 2014-07-03 MED ORDER — FAMOTIDINE IN NACL 20-0.9 MG/50ML-% IV SOLN
20.0000 mg | Freq: Once | INTRAVENOUS | Status: AC
  Administered 2014-07-03: 20 mg via INTRAVENOUS

## 2014-07-03 MED ORDER — INSULIN REGULAR HUMAN 100 UNIT/ML IJ SOLN: 2.0000 [IU] | Freq: Once | INTRAMUSCULAR | Status: DC | PRN

## 2014-07-03 MED ORDER — DEXAMETHASONE SODIUM PHOSPHATE 20 MG/5ML IJ SOLN
20.0000 mg | Freq: Once | INTRAMUSCULAR | Status: AC
  Administered 2014-07-03: 20 mg via INTRAVENOUS

## 2014-07-03 MED ORDER — PACLITAXEL CHEMO INJECTION 300 MG/50ML
80.0000 mg/m2 | Freq: Once | INTRAVENOUS | Status: AC
  Administered 2014-07-03: 138 mg via INTRAVENOUS
  Filled 2014-07-03: qty 23

## 2014-07-03 MED ORDER — HEPARIN SOD (PORK) LOCK FLUSH 100 UNIT/ML IV SOLN
500.0000 [IU] | Freq: Once | INTRAVENOUS | Status: AC | PRN
  Administered 2014-07-03: 500 [IU]
  Filled 2014-07-03: qty 5

## 2014-07-03 MED ORDER — DEXAMETHASONE SODIUM PHOSPHATE 20 MG/5ML IJ SOLN
INTRAMUSCULAR | Status: AC
  Filled 2014-07-03: qty 5

## 2014-07-03 MED ORDER — DIPHENHYDRAMINE HCL 50 MG/ML IJ SOLN
INTRAMUSCULAR | Status: AC
  Filled 2014-07-03: qty 1

## 2014-07-03 MED ORDER — SODIUM CHLORIDE 0.9 % IV SOLN
Freq: Once | INTRAVENOUS | Status: AC
  Administered 2014-07-03: 14:00:00 via INTRAVENOUS

## 2014-07-03 MED ORDER — FAMOTIDINE IN NACL 20-0.9 MG/50ML-% IV SOLN
INTRAVENOUS | Status: AC
  Filled 2014-07-03: qty 50

## 2014-07-03 MED ORDER — ONDANSETRON 8 MG/50ML IVPB (CHCC)
8.0000 mg | Freq: Once | INTRAVENOUS | Status: AC
  Administered 2014-07-03: 8 mg via INTRAVENOUS

## 2014-07-03 MED ORDER — DIPHENHYDRAMINE HCL 50 MG/ML IJ SOLN
25.0000 mg | Freq: Once | INTRAMUSCULAR | Status: AC
  Administered 2014-07-03: 25 mg via INTRAVENOUS

## 2014-07-03 MED ORDER — SODIUM CHLORIDE 0.9 % IJ SOLN
10.0000 mL | INTRAMUSCULAR | Status: DC | PRN
  Administered 2014-07-03: 10 mL via INTRAVENOUS
  Filled 2014-07-03: qty 10

## 2014-07-03 MED ORDER — SODIUM CHLORIDE 0.9 % IJ SOLN
10.0000 mL | INTRAMUSCULAR | Status: DC | PRN
  Administered 2014-07-03: 10 mL
  Filled 2014-07-03: qty 10

## 2014-07-03 MED ORDER — ONDANSETRON 8 MG/NS 50 ML IVPB
INTRAVENOUS | Status: AC
  Filled 2014-07-03: qty 8

## 2014-07-03 NOTE — Progress Notes (Signed)
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 This is the last planned chemotherapy treatment; she will have granix 300 on 07-04-14. Patient plans to return to [place] on 07-05-14, where she will be residing until ~ April 2016, with exception of returning to [place] week of Christmas. She has been scheduled for restaging CT AP and visit with gyn oncology (Dr Janie Morning) on 08-07-14.   Patient overall has continued to tolerate chemotherapy well. She does not have actual numbness or tingling in hands or feet, tho she complains that feet are uncomfortable after she stands "more than 3 hours". She has had more swelling in lower legs and feet, which she feels is related to excessive salt in diet recently. Occasional nausea is improved with prn antiemetics and she is generally eating and drinking fluids well. Bowels are moving adequately. She denies abdominal or pelvic discomfort.  Review of systems as above, also: No fever or symptoms of infection. No bleeding. No problems with PAC. Remainder of 10 point Review of Systems negative  She has PAC, flushed 07-03-14. Flu vaccine done.  ONCOLOGIC HISTORY  Left fallopian carcinoma:   Patient had acute abdominal pain on 01-02-14, seen in ED and admitted to [place], [place]. Per patient, CT scans were done, tho I do not have those reports; laboratory results also do not accompany (pre op CA125 not known). She was taken to optimal debulking by Dr Milana Kidney on 01-05-14 (hysterectomy, BSO, PPLND,omentectomy, pelvic peritonectomy and appendectomy. Pathology [facility] Dept of Pathology, [id number] from 01-05-14) : high grade  serous carcinoma of distal left fallopian tube involving ovary, surface microfocus involvement right ovary with benign right tube, benign endometrium with 2 polyps and leiomyomata, benign cervix, multiple scattered foci of adenocarcinoma in omentum, 1 right pelvic and 2 right periaortic LN benign,2 left pelvic and 5 left periaortic LN benign, sigmoid biopsy with microscopic well differentiated metastatic carcinoma. No washings reported in this information. Pathologic stage [id number] (stage IIIA). She was discharged home on 01-11-14. She was doing well from the surgery when seen by Dr James Ivanoff on 01-18-14, and has contiued to recover from that without problems. She was not on home lovenox.  Start of treatment was delayed as patient moved back to [place] after follow up visit with Dr Robley Fries on 01-18-14. She saw Dr Skeet Latch in consultation on 02-09-14, also with recommendation for adjuvant chemotherapy, which began with dose dense taxol carboplatin on 02-28-14. She was neutropenic by day 15 cycle 1, with that treatment delayed x 1 week and gCSF added. She will complete total 6 cycles on 07-03-14.  .  Objective:  Vital signs in last 24 hours:  BP 163/67 mmHg  Pulse 94  Temp(Src) 98.3 F (36.8 C) (Oral)  Resp 20  Ht 5\' 4"  (1.626 m)  Wt 161 lb 12.8 oz (73.392 kg)  BMI 27.76 kg/m2 Weight is stable. Alert, oriented and appropriate. Ambulatory without difficulty. Looks comfortable, respirations not labored RA Alopecia  HEENT:PERRL, sclerae not icteric. Oral mucosa moist without lesions, posterior pharynx clear.  Neck supple. No JVD.  Lymphatics:no cervical,suraclavicular, axillary or inguinal adenopathy Resp: clear to auscultation bilaterally and normal percussion bilaterally Cardio: regular rate and rhythm. No gallop. GI: soft, nontender, not  distended, no mass or organomegaly. Normally active bowel sounds. Surgical incision not remarkable. Musculoskeletal/ Extremities: 1-2+ swelling feet and  lower legs L>R, no cords or tenderness.  Neuro: no peripheral neuropathy. Otherwise nonfocal. PSYCH appropriate mood and affect. Skin without rash, ecchymosis, petechiae Portacath-without erythema or tenderness  Lab Results:  Results for orders placed or performed in visit on 07/03/14  CBC with Differential  Result Value Ref Range   WBC 2.6 (L) 3.9 - 10.3 10e3/uL   NEUT# 2.0 1.5 - 6.5 10e3/uL   HGB 9.2 (L) 11.6 - 15.9 g/dL   HCT 27.0 (L) 34.8 - 46.6 %   Platelets 176 145 - 400 10e3/uL   MCV 102.7 (H) 79.5 - 101.0 fL   MCH 35.0 (H) 25.1 - 34.0 pg   MCHC 34.1 31.5 - 36.0 g/dL   RBC 2.63 (L) 3.70 - 5.45 10e6/uL   RDW 19.2 (H) 11.2 - 14.5 %   lymph# 0.5 (L) 0.9 - 3.3 10e3/uL   MONO# 0.1 0.1 - 0.9 10e3/uL   Eosinophils Absolute 0.0 0.0 - 0.5 10e3/uL   Basophils Absolute 0.0 0.0 - 0.1 10e3/uL   NEUT% 75.9 38.4 - 76.8 %   LYMPH% 20.6 14.0 - 49.7 %   MONO% 3.1 0.0 - 14.0 %   EOS% 0.0 0.0 - 7.0 %   BASO% 0.4 0.0 - 2.0 %  Comprehensive metabolic panel (Cmet) - [facility]  Result Value Ref Range   Sodium 138 136 - 145 mEq/L   Potassium 3.9 3.5 - 5.1 mEq/L   Chloride 104 98 - 109 mEq/L   CO2 24 22 - 29 mEq/L   Glucose 225 (H) 70 - 140 mg/dl   BUN 20.3 7.0 - 26.0 mg/dL   Creatinine 1.1 0.6 - 1.1 mg/dL   Total Bilirubin 0.41 0.20 - 1.20 mg/dL   Alkaline Phosphatase 56 40 - 150 U/L   AST 17 5 - 34 U/L   ALT 15 0 - 55 U/L   Total Protein 6.5 6.4 - 8.3 g/dL   Albumin 3.7 3.5 - 5.0 g/dL   Calcium 9.5 8.4 - 10.4 mg/dL   Anion Gap 10 3 - 11 mEq/L     Studies/Results:  No results found. LE venous dopplers ordered  Medications: I have reviewed the patient's current medications.  DISCUSSION:  Venous dopplers requested to be done today or 07-04-14.  Patient is returning to [place] on 07-05-14, tho she will be back in [place] week of Christmas and has appointment for CT and to see Dr Skeet Latch on 08-07-14. She expects that she will then be back in [place] thru ~ April 2016. She is in  agreement with asking Dr Milana Kidney with [facility] in [place] to see her back hopefully late Nov or early Dec, then likely after first of year while she is still in [place]. Patient is to call Dr James Ivanoff to se up appointment and we will send our records. I have asked patient to let our office know when she is back in [place] next spring, to set up additional appointments here. Note she has PAC, which will be flushed + lab 08-07-14 with Dr Leone Brand visit, and will need to be flushed every 6-8 weeks here or with [facility] if not otherwise used.   I have spoken directly with Dr Loma Sender office now (phone [phone], fax [phone]). They request records be faxed and that patient call to schedule appointment. I have given that office my contact information and am certainly glad to  speak with Dr James Ivanoff if needed.  Assessment/Plan: 1.IIIA high grade serous carcinoma of left fallopian tube: post optimal debulking 01-05-14, day 1 cycle 1 dose dense carbo taxol given 02-28-14, to complete cycle 6 today.  She will be seen by Dr James Ivanoff while in [place]. CT AP and follow up with Dr Skeet Latch coordinated for the week that she is back in [place]. She may be eligible for GOG 225 diet and exercise study, message sent to [employer] in that regard now. She is to let this office know when she plans to return to [facility] (~ April - May 2016) so that we can set up next visit then.  2.progressive anemia thought chemo related: improved with transfusion 06-06-14, continuing iron. Iron studies not markedly low, hemoccults negative 06-26-14. She will need labs checked here with Dr Leone Brand visit in Dec. 3.increased LE swelling: venous dopplers ordered.  4.thickening left breast, history of some finding on that side previously: reportedly has been followed and she is up to date on mammogreams, last in [place]. She should have next mammograms at same facility in [place] on  schedule. 5.creatinine back to baseline 6..hypertension: appreciate adjustment in medications by Dr Mancel Bale. Elevated cholesterol  7.colon polyps: may be due colonoscopy upcoming. 8.social: Husband with dementia, patient is primary caregiver  9.PAC in: will need flush every 6-8 weeks when not otherwise used. 10.flu vaccine done    Chemo orders confirmed. Information listed for RN to send to Dr James Ivanoff. Patient understands and is in agreement with plans above. TIme spent 40 min including >50% counseling and coordination of care  Tanicia Wolaver P, MD   07/03/2014, 1:47 PM

## 2014-07-03 NOTE — Patient Instructions (Signed)

## 2014-07-03 NOTE — Patient Instructions (Signed)
[facility] Discharge Instructions for Patients Receiving Chemotherapy  Today you received the following chemotherapy agents: Taxol  To help prevent nausea and vomiting after your treatment, we encourage you to take your nausea medication as prescribed by your physician.  If you develop nausea and vomiting that is not controlled by your nausea medication, call the clinic.   BELOW ARE SYMPTOMS THAT SHOULD BE REPORTED IMMEDIATELY:  *FEVER GREATER THAN 100.5 F  *CHILLS WITH OR WITHOUT FEVER  NAUSEA AND VOMITING THAT IS NOT CONTROLLED WITH YOUR NAUSEA MEDICATION  *UNUSUAL SHORTNESS OF BREATH  *UNUSUAL BRUISING OR BLEEDING  TENDERNESS IN MOUTH AND THROAT WITH OR WITHOUT PRESENCE OF ULCERS  *URINARY PROBLEMS  *BOWEL PROBLEMS  UNUSUAL RASH Items with * indicate a potential emergency and should be followed up as soon as possible.  Feel free to call the clinic you have any questions or concerns. The clinic phone number is [phone].    

## 2014-07-04 ENCOUNTER — Ambulatory Visit (HOSPITAL_BASED_OUTPATIENT_CLINIC_OR_DEPARTMENT_OTHER): Payer: Medicare Other

## 2014-07-04 ENCOUNTER — Other Ambulatory Visit: Payer: Self-pay | Admitting: Oncology

## 2014-07-04 ENCOUNTER — Telehealth: Payer: Self-pay | Admitting: *Deleted

## 2014-07-04 ENCOUNTER — Ambulatory Visit (HOSPITAL_COMMUNITY)
Admission: RE | Admit: 2014-07-04 | Discharge: 2014-07-04 | Disposition: A | Discharge status: Home / Self Care | Payer: Medicare Other | Source: Ambulatory Visit | Attending: Vascular Surgery | Admitting: Vascular Surgery

## 2014-07-04 ENCOUNTER — Other Ambulatory Visit: Payer: Self-pay | Admitting: *Deleted

## 2014-07-04 DIAGNOSIS — C5702 Malignant neoplasm of left fallopian tube: Secondary | ICD-10-CM | POA: Insufficient documentation

## 2014-07-04 DIAGNOSIS — M79609 Pain in unspecified limb: Secondary | ICD-10-CM

## 2014-07-04 DIAGNOSIS — R609 Edema, unspecified: Secondary | ICD-10-CM

## 2014-07-04 DIAGNOSIS — M7989 Other specified soft tissue disorders: Secondary | ICD-10-CM | POA: Insufficient documentation

## 2014-07-04 DIAGNOSIS — Z79899 Other long term (current) drug therapy: Secondary | ICD-10-CM | POA: Diagnosis not present

## 2014-07-04 DIAGNOSIS — C5700 Malignant neoplasm of unspecified fallopian tube: Secondary | ICD-10-CM

## 2014-07-04 DIAGNOSIS — I82409 Acute embolism and thrombosis of unspecified deep veins of unspecified lower extremity: Secondary | ICD-10-CM

## 2014-07-04 DIAGNOSIS — R202 Paresthesia of skin: Secondary | ICD-10-CM | POA: Insufficient documentation

## 2014-07-04 DIAGNOSIS — Z5189 Encounter for other specified aftercare: Secondary | ICD-10-CM

## 2014-07-04 MED ORDER — FONDAPARINUX SODIUM 7.5 MG/0.6ML ~~LOC~~ SOLN
7.5000 mg | Freq: Once | SUBCUTANEOUS | Status: AC
  Administered 2014-07-04: 7.5 mg via SUBCUTANEOUS
  Filled 2014-07-04: qty 0.6

## 2014-07-04 MED ORDER — FONDAPARINUX SODIUM 7.5 MG/0.6ML ~~LOC~~ SOLN: 7.5000 mg | SUBCUTANEOUS | Status: DC

## 2014-07-04 MED ORDER — TBO-FILGRASTIM 300 MCG/0.5ML ~~LOC~~ SOSY
300.0000 ug | PREFILLED_SYRINGE | Freq: Once | SUBCUTANEOUS | Status: AC
  Administered 2014-07-04: 300 ug via SUBCUTANEOUS
  Filled 2014-07-04: qty 0.5

## 2014-07-04 NOTE — Telephone Encounter (Signed)
Received call from [place] in vascular lab that patient is positive for right popliteal DVT. Told [place] to sent patient back to the [facility] lobby and I will give her further instructions here. Received five Arixtra syringes (7.5mg ) from pharmacy to give to patient since she is flying to [place] tomorrow. Did teaching and education for patient on Arixtra. Patient gave herself first Arixtra injection today with guidance and did not have any problems. Got patient an appointment with her PCP in [place], Dr. Luberta Robertson, for Monday, 07/10/14 at [facility]. Pt instructed to do one Arixtra injection daily until she follows-up with Dr. Luberta Robertson. Pt agreeable to this. Called 5 more Arixtra injections into the [facility] that patient's uses in [place] (Per Dr. Marko Plume pt needs 10 day supply - given 5 here for total of 10).   Printed information on DVT and on Arixtra in [facility] and gave to patient. Instructed her that is she develops chest pain, SOB or increased leg swellilng to please go to the emergency room. Pt agreeable to this. Told patient to call us with any questions regarding injections in the next few days as well.

## 2014-07-04 NOTE — Patient Instructions (Addendum)
Appointment with Dr. Luberta Robertson Monday, 07/10/14 at 9am.   Instructions: Arixtra 7.5mg  injection daily subcutaneously. Continue these injections until your appointment with Dr. Luberta Robertson.  Deep Vein Thrombosis A deep vein thrombosis (DVT) is a blood clot that develops in the deep, larger veins of the leg, arm, or pelvis. These are more dangerous than clots that might form in veins near the surface of the body. A DVT can lead to serious and even life-threatening complications if the clot breaks off and travels in the bloodstream to the lungs.  A DVT can damage the valves in your leg veins so that instead of flowing upward, the blood pools in the lower leg. This is called post-thrombotic syndrome, and it can result in pain, swelling, discoloration, and sores on the leg. CAUSES Usually, several things contribute to the formation of blood clots. Contributing factors include:  The flow of blood slows down.  The inside of the vein is damaged in some way.  You have a condition that makes blood clot more easily. RISK FACTORS Some people are more likely than others to develop blood clots. Risk factors include:   Smoking.  Being overweight (obese).  Sitting or lying still for a long time. This includes long-distance travel, paralysis, or recovery from an illness or surgery. Other factors that increase risk are:   Older age, especially over 31 years of age.  Having a family history of blood clots or if you have already had a blot clot.  Having major or lengthy surgery. This is especially true for surgery on the hip, knee, or belly (abdomen). Hip surgery is particularly high risk.  Having a long, thin tube (catheter) placed inside a vein during a medical procedure.  Breaking a hip or leg.  Having cancer or cancer treatment.  Pregnancy and childbirth.  Hormone changes make the blood clot more easily during pregnancy.  The fetus puts pressure on the veins of the pelvis.  There is a risk  of injury to veins during delivery or a caesarean delivery. The risk is highest just after childbirth.  Medicines containing the female hormone estrogen. This includes birth control pills and hormone replacement therapy.  Other circulation or heart problems.  SIGNS AND SYMPTOMS When a clot forms, it can either partially or totally block the blood flow in that vein. Symptoms of a DVT can include:  Swelling of the leg or arm, especially if one side is much worse.  Warmth and redness of the leg or arm, especially if one side is much worse.  Pain in an arm or leg. If the clot is in the leg, symptoms may be more noticeable or worse when standing or walking. The symptoms of a DVT that has traveled to the lungs (pulmonary embolism, PE) usually start suddenly and include:  Shortness of breath.  Coughing.  Coughing up blood or blood-tinged mucus.  Chest pain. The chest pain is often worse with deep breaths.  Rapid heartbeat. Anyone with these symptoms should get emergency medical treatment right away. Do not wait to see if the symptoms will go away. Call your local emergency services (911 in the [place] if you have these symptoms. Do not drive yourself to the hospital. DIAGNOSIS If a DVT is suspected, your health care provider will take a full medical history and perform a physical exam. Tests that also may be required include:  Blood tests, including studies of the clotting properties of the blood.  Ultrasound to see if you have clots in your legs or  lungs.  X-rays to show the flow of blood when dye is injected into the veins (venogram).  Studies of your lungs if you have any chest symptoms. PREVENTION  Exercise the legs regularly. Take a brisk 30-minute walk every day.  Maintain a weight that is appropriate for your height.  Avoid sitting or lying in bed for long periods of time without moving your legs.  Women, particularly those over the age of 53 years, should consider the risks  and benefits of taking estrogen medicines, including birth control pills.  Do not smoke, especially if you take estrogen medicines.  Long-distance travel can increase your risk of DVT. You should exercise your legs by walking or pumping the muscles every hour.  Many of the risk factors above relate to situations that exist with hospitalization, either for illness, injury, or elective surgery. Prevention may include medical and nonmedical measures.  Your health care provider will assess you for the need for venous thromboembolism prevention when you are admitted to the hospital. If you are having surgery, your surgeon will assess you the day of or day after surgery. TREATMENT Once identified, a DVT can be treated. It can also be prevented in some circumstances. Once you have had a DVT, you may be at increased risk for a DVT in the future. The most common treatment for DVT is blood-thinning (anticoagulant) medicine, which reduces the blood's tendency to clot. Anticoagulants can stop new blood clots from forming and stop old clots from growing. They cannot dissolve existing clots. Your body does this by itself over time. Anticoagulants can be given by mouth, through an IV tube, or by injection. Your health care provider will determine the best program for you. Other medicines or treatments that may be used are:  Heparin or related medicines (low molecular weight heparin) are often the first treatment for a blood clot. They act quickly. However, they cannot be taken orally and must be given either in shot form or by IV tube.  Heparin can cause a fall in a component of blood that stops bleeding and forms blood clots (platelets). You will be monitored with blood tests to be sure this does not occur.  Warfarin is an anticoagulant that can be swallowed. It takes a few days to start working, so usually heparin or related medicines are used in combination. Once warfarin is working, heparin is usually  stopped.  Factor Xa inhibitor medicines, such as rivaroxaban and apixaban, also reduce blood clotting. These medicines are taken orally and can often be used without heparin or related medicines.  Less commonly, clot dissolving drugs (thrombolytics) are used to dissolve a DVT. They carry a high risk of bleeding, so they are used mainly in severe cases where your life or a part of your body is threatened.  Very rarely, a blood clot in the leg needs to be removed surgically.  If you are unable to take anticoagulants, your health care provider may arrange for you to have a filter placed in a main vein in your abdomen. This filter prevents clots from traveling to your lungs. HOME CARE INSTRUCTIONS  Take all medicines as directed by your health care provider.  Learn as much as you can about DVT.  Wear a medical alert bracelet or carry a medical alert card.  Ask your health care provider how soon you can go back to normal activities. It is important to stay active to prevent blood clots. If you are on anticoagulant medicine, avoid contact sports.  It  is very important to exercise. This is especially important while traveling, sitting, or standing for long periods of time. Exercise your legs by walking or by tightening and relaxing your leg muscles regularly. Take frequent walks.  You may need to wear compression stockings. These are tight elastic stockings that apply pressure to the lower legs. This pressure can help keep the blood in the legs from clotting. Taking Warfarin Warfarin is a daily medicine that is taken by mouth. Your health care provider will advise you on the length of treatment (usually 3-6 months, sometimes lifelong). If you take warfarin:  Understand how to take warfarin and foods that can affect how warfarin works in Veterinary surgeon.  Too much and too little warfarin are both dangerous. Too much warfarin increases the risk of bleeding. Too little warfarin continues to allow the risk  for blood clots. Warfarin and Regular Blood Testing While taking warfarin, you will need to have regular blood tests to measure your blood clotting time. These blood tests usually include both the prothrombin time (PT) and international normalized ratio (INR) tests. The PT and INR results allow your health care provider to adjust your dose of warfarin. It is very important that you have your PT and INR tested as often as directed by your health care provider.  Warfarin and Your Diet Avoid major changes in your diet, or notify your health care provider before changing your diet. Arrange a visit with a registered dietitian to answer your questions. Many foods, especially foods high in vitamin K, can interfere with warfarin and affect the PT and INR results. You should eat a consistent amount of foods high in vitamin K. Foods high in vitamin K include:   Spinach, kale, broccoli, cabbage, collard and turnip greens, Brussels sprouts, peas, cauliflower, seaweed, and parsley.  Beef and pork liver.  Green tea.  Soybean oil. Warfarin with Other Medicines Many medicines can interfere with warfarin and affect the PT and INR results. You must:  Tell your health care provider about any and all medicines, vitamins, and supplements you take, including aspirin and other over-the-counter anti-inflammatory medicines. Be especially cautious with aspirin and anti-inflammatory medicines. Ask your health care provider before taking these.  Do not take or discontinue any prescribed or over-the-counter medicine except on the advice of your health care provider or pharmacist. Warfarin Side Effects Warfarin can have side effects, such as easy bruising and difficulty stopping bleeding. Ask your health care provider or pharmacist about other side effects of warfarin. You will need to:  Hold pressure over cuts for longer than usual.  Notify your dentist and other health care providers that you are taking warfarin  before you undergo any procedures where bleeding may occur. Warfarin with Alcohol and Tobacco   Drinking alcohol frequently can increase the effect of warfarin, leading to excess bleeding. It is best to avoid alcoholic drinks or to consume only very small amounts while taking warfarin. Notify your health care provider if you change your alcohol intake.   Do not use any tobacco products including cigarettes, chewing tobacco, or electronic cigarettes. If you smoke, quit. Ask your health care provider for help with quitting smoking. Alternative Medicines to Warfarin: Factor Xa Inhibitor Medicines  These blood-thinning medicines are taken by mouth, usually for several weeks or longer. It is important to take the medicine every single day at the same time each day.  There are no regular blood tests required when using these medicines.  There are fewer food and drug  interactions than with warfarin.  The side effects of this class of medicine are similar to those of warfarin, including excessive bruising or bleeding. Ask your health care provider or pharmacist about other potential side effects. SEEK MEDICAL CARE IF:  You notice a rapid heartbeat.  You feel weaker or more tired than usual.  You feel faint.  You notice increased bruising.  You feel your symptoms are not getting better in the time expected.  You believe you are having side effects of medicine. SEEK IMMEDIATE MEDICAL CARE IF:  You have chest pain.  You have trouble breathing.  You have new or increased swelling or pain in one leg.  You cough up blood.  You notice blood in vomit, in a bowel movement, or in urine. MAKE SURE YOU:  Understand these instructions.  Will watch your condition.  Will get help right away if you are not doing well or get worse. Document Released: 08/04/2005 Document Revised: 12/19/2013 Document Reviewed: 04/11/2013 [facility] Patient Information 2015 [place], [place]. This information is not  intended to replace advice given to you by your health care provider. Make sure you discuss any questions you have with your health care provider.   Fondaparinux injection What is this medicine? FONDAPARINUX ([place] in ux) is used after knee, hip, or abdominal surgeries to prevent blood clotting. It is also used to treat existing blood clots in the lungs or in the veins. This medicine may be used for other purposes; ask your health care provider or pharmacist if you have questions. COMMON BRAND NAME(S): Arixtra What should I tell my health care provider before I take this medicine? They need to know if you have any of these conditions: -bleeding disorders, hemorrhage, or hemophilia -infection of the heart or heart valves -kidney or liver disease -previous stroke -recent surgery or child birth -ulcer in the stomach or intestine, diverticulitis, or other bowel disease -an unusual or allergic reaction to fondaparinux, heparin, low molecular weight heparins, other medicines, foods, dyes, or preservatives -pregnant or trying to get pregnant -breast-feeding How should I use this medicine? This medicine is for injection under the skin. It is usually given by a health care professional in a hospital or clinic setting. If you get this medicine at home, you will be taught how to prepare and give this medicine. Use exactly as directed. Take your medicine at regular intervals. Do not take your medicine more often than directed. Do not stop taking except on your doctor's advice. Stopping this medicine may increase your risk of a blot clot. Be sure to refill your prescription before you run out of medicine. It is important that you put your used needles and syringes in a special sharps container. Do not put them in a trash can. If you do not have a sharps container, call your pharmacist or healthcare provider to get one. Talk to your pediatrician regarding the use of this medicine in children. Special  care may be needed. Overdosage: If you think you have taken too much of this medicine contact a poison control center or emergency room at once. NOTE: This medicine is only for you. Do not share this medicine with others. What if I miss a dose? If you miss a dose, take it as soon as you can. If it is almost time for your next dose, take only that dose. Do not take double or extra doses. What may interact with this medicine? Do not take this medicine with any of the following medications: -  aspirin and aspirin-like medicines -heparin -mifepristone -warfarin This medicine may also interact with the following medications: -antiinflammatory drugs like ibuprofen -cilostazol -clopidogrel -dipyridamole -sulfinpyrazone -ticlopidine This list may not describe all possible interactions. Give your health care provider a list of all the medicines, herbs, non-prescription drugs, or dietary supplements you use. Also tell them if you smoke, drink alcohol, or use illegal drugs. Some items may interact with your medicine. What should I watch for while using this medicine? Visit your doctor or health care professional for regular checks on your progress. Your condition will be monitored carefully while you are receiving this medicine. Notify your doctor or health care professional and seek emergency treatment if you develop breathing problems; changes in vision; chest pain; severe, sudden headache; pain, swelling, warmth in the leg; trouble speaking; sudden numbness or weakness of the face, arm, or leg. These can be signs that your condition has gotten worse. If you are going to have surgery, tell your doctor or health care professional that you are taking this medicine. Avoid sports and activities that might cause injury while you are using this medicine. Severe falls or injuries can cause unseen bleeding. Be careful when using sharp tools or knives. Consider using an Copy. Take special care brushing  or flossing your teeth. Report any injuries, bruising, or red spots on the skin to your doctor or health care professional. What side effects may I notice from receiving this medicine? Side effects that you should report to your doctor or health care professional as soon as possible: -allergic reactions like skin rash, itching or hives, swelling of the face, lips, or tongue -dizziness -feeling faint or lightheaded, falls -signs and symptoms of bleeding such as bloody or black, tarry stools; red or dark-brown urine; spitting up blood or brown material that looks like coffee grounds; red spots on the skin; unusual bruising or bleeding from the eye, gums, or nose Side effects that usually do not require medical attention (report to your doctor or health care professional if they continue or are bothersome): -pain, redness, or irritation at site where injected This list may not describe all possible side effects. Call your doctor for medical advice about side effects. You may report side effects to FDA at 1-800-FDA-1088. Where should I keep my medicine? Keep out of the reach of children. Store at room temperature between 15 and 30 degrees C (59 and 86 degrees F). Do not freeze. Throw away any unused medicine after the expiration date. NOTE: This sheet is a summary. It may not cover all possible information. If you have questions about this medicine, talk to your doctor, pharmacist, or health care provider.  2015, [employer]. (2012-11-30 16:14:52)

## 2014-07-04 NOTE — Progress Notes (Signed)
VASCULAR LAB PRELIMINARY  PRELIMINARY  PRELIMINARY  PRELIMINARY  Bilateral lower extremity venous duplex completed.    Preliminary report:  Right:  DVT noted in the popliteal vein .  No evidence of superficial thrombosis.  No Baker's cyst. Left:  No evidence of DVT, superficial thrombosis, or Baker's cyst.  Tex Conroy, RVS 07/04/2014, 2:33 PM

## 2014-07-04 NOTE — Telephone Encounter (Signed)
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 Needs to begin either lovenox ~ 115 mg daily (so 120 syringe and instruct on 115mg ) or Arixtra 7.5 mg daily.   If she is still leaving for [place] on 07-05-14, we should just do LMW heparin and let either her PCP in [place] or gyn oncologist Dr James Ivanoff in [place] convert to coumadin or other oral anticoagulant.  Please use pharmacy supply for first dose today. RN to instruct on injections.  Check with her pharmacy or [facility], as she will need at least 10 day supply if she is going to [place].  She will need to be seen by MD in [place] at least first of next week (at least 11-23 or 11-24), either her PCP in [place] or at Dr Loma Sender office, to follow up the anticoagulation  Need to send doppler report + this information to Dr Loma Sender office AND ALSO get name/ contact PCP in [place] to send doppler report, this information, my new patient consult note and last 2-3 office notes, most recent 2-3 chemistries and counts, med list all to PCP also.  If any SOB or chest discomfort today, she will need to be seen (C.Bacon if possible).  Thank you

## 2014-07-05 ENCOUNTER — Telehealth: Payer: Self-pay | Admitting: *Deleted

## 2014-07-05 NOTE — Telephone Encounter (Signed)
Called [facility] in [place] to make sure Arixtra was filled for pt. They stated it is ready to pick up and she owes $148.79. Called pt to let her know this and she is agreeable to pick up script once she arrives to [place]. Pt states she has done Arixtra injection for today already and has put on support hose in anticipation of her flight. Told pt to call us with any further concerns and reminded her again to please go to the ED if she develops chest pain, SOB or any other symptoms develop before her appt on Monday with Dr. Luberta Robertson. Pt agreeable to this.

## 2014-07-07 ENCOUNTER — Telehealth: Payer: Self-pay | Admitting: *Deleted

## 2014-07-07 ENCOUNTER — Telehealth: Payer: Self-pay

## 2014-07-07 NOTE — Telephone Encounter (Signed)
Per Raquel Sarna at Dr. Loma Sender office they received faxed pt records. They are just waiting on Mrs. Ferch to call and schedule appointment. Records sent to patient's PCP, Dr. Melida Gimenez Day and they state they also received pt's records.

## 2014-07-07 NOTE — Telephone Encounter (Signed)
[name] calling from [place] and asking when she can get in a pool and swim and eat oysters and raw tuna. Told her that she should wait until she sees the physician Monday regarding the new DVT/coumadin dosing and ask that physician if she is able to swim. Told her that she should wait at least a couple of more weeks for the raw seafood as her blood counts have not nadired.  [name] verbalized understanding.

## 2014-07-17 ENCOUNTER — Ambulatory Visit: Payer: Medicare Other | Admitting: Gynecologic Oncology

## 2014-07-20 ENCOUNTER — Ambulatory Visit: Payer: Medicare Other | Admitting: Gynecologic Oncology

## 2014-08-03 ENCOUNTER — Telehealth: Payer: Self-pay | Admitting: Oncology

## 2014-08-03 ENCOUNTER — Telehealth: Payer: Self-pay

## 2014-08-03 NOTE — Telephone Encounter (Signed)
Medical Oncology  MD aware of PT/INR to be done at [facility] with gyn oncology visit on 08-07-14. LM for patient that I have also asked Research RN to speak with her at office that day re diet and exercise study which may be of interest to her.  Godfrey Pick, MD

## 2014-08-03 NOTE — Telephone Encounter (Signed)
Pt in [place], will be flying back to [place] tomorrow 12/18 for a visit and returning to [facility]. Pt is recently placed on coumadin b/c insurance would not cover xarelto. MD in [facility] adjusted her coumadin and wants her PT/INR checked while in [place]. Pt has a script. Pt will have labs, CT and see Dr Skeet Latch. B/c she has an existing lab appt, the PT/INR can be done here. Pt will call Dr Melida Gimenez in [facility] with the results for adjustment of coumadin, and we can fax results to him. Pt is in agreement with this plan. Message routed to Dr Marko Plume and Barbaraann Share.

## 2014-08-04 ENCOUNTER — Telehealth: Payer: Self-pay | Admitting: Gynecologic Oncology
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
## 2014-08-07 ENCOUNTER — Telehealth: Payer: Self-pay | Admitting: *Deleted

## 2014-08-07 ENCOUNTER — Other Ambulatory Visit: Payer: Medicare Other

## 2014-08-07 ENCOUNTER — Ambulatory Visit: Payer: Medicare Other | Admitting: Oncology

## 2014-08-07 ENCOUNTER — Other Ambulatory Visit: Payer: Self-pay

## 2014-08-07 ENCOUNTER — Ambulatory Visit (HOSPITAL_COMMUNITY)
Admission: RE | Admit: 2014-08-07 | Discharge: 2014-08-07 | Disposition: A | Discharge status: Home / Self Care | Payer: Medicare Other | Source: Ambulatory Visit | Attending: Oncology | Admitting: Oncology

## 2014-08-07 ENCOUNTER — Encounter (HOSPITAL_COMMUNITY): Payer: Self-pay

## 2014-08-07 ENCOUNTER — Other Ambulatory Visit: Payer: Self-pay | Admitting: Oncology

## 2014-08-07 ENCOUNTER — Encounter: Payer: Self-pay | Admitting: *Deleted

## 2014-08-07 ENCOUNTER — Ambulatory Visit (HOSPITAL_BASED_OUTPATIENT_CLINIC_OR_DEPARTMENT_OTHER): Payer: Medicare Other | Admitting: Lab

## 2014-08-07 ENCOUNTER — Ambulatory Visit: Payer: Medicare Other | Attending: Gynecologic Oncology | Admitting: Gynecologic Oncology

## 2014-08-07 ENCOUNTER — Ambulatory Visit (HOSPITAL_BASED_OUTPATIENT_CLINIC_OR_DEPARTMENT_OTHER): Payer: Medicare Other

## 2014-08-07 ENCOUNTER — Encounter: Payer: Self-pay | Admitting: Gynecologic Oncology

## 2014-08-07 VITALS — BP 151/65 | HR 88 | Temp 98.3°F | Resp 16 | Ht 64.0 in | Wt 160.9 lb

## 2014-08-07 DIAGNOSIS — N2889 Other specified disorders of kidney and ureter: Secondary | ICD-10-CM | POA: Insufficient documentation

## 2014-08-07 DIAGNOSIS — I82411 Acute embolism and thrombosis of right femoral vein: Secondary | ICD-10-CM

## 2014-08-07 DIAGNOSIS — I7090 Unspecified atherosclerosis: Secondary | ICD-10-CM | POA: Insufficient documentation

## 2014-08-07 DIAGNOSIS — C5700 Malignant neoplasm of unspecified fallopian tube: Secondary | ICD-10-CM | POA: Diagnosis present

## 2014-08-07 DIAGNOSIS — C5702 Malignant neoplasm of left fallopian tube: Secondary | ICD-10-CM

## 2014-08-07 DIAGNOSIS — R911 Solitary pulmonary nodule: Secondary | ICD-10-CM

## 2014-08-07 DIAGNOSIS — Z90710 Acquired absence of both cervix and uterus: Secondary | ICD-10-CM | POA: Diagnosis not present

## 2014-08-07 DIAGNOSIS — Z95828 Presence of other vascular implants and grafts: Secondary | ICD-10-CM

## 2014-08-07 DIAGNOSIS — I10 Essential (primary) hypertension: Secondary | ICD-10-CM | POA: Insufficient documentation

## 2014-08-07 DIAGNOSIS — R222 Localized swelling, mass and lump, trunk: Secondary | ICD-10-CM | POA: Diagnosis not present

## 2014-08-07 DIAGNOSIS — D1771 Benign lipomatous neoplasm of kidney: Secondary | ICD-10-CM | POA: Insufficient documentation

## 2014-08-07 DIAGNOSIS — E780 Pure hypercholesterolemia: Secondary | ICD-10-CM | POA: Insufficient documentation

## 2014-08-07 DIAGNOSIS — Z90722 Acquired absence of ovaries, bilateral: Secondary | ICD-10-CM | POA: Insufficient documentation

## 2014-08-07 DIAGNOSIS — Z452 Encounter for adjustment and management of vascular access device: Secondary | ICD-10-CM

## 2014-08-07 DIAGNOSIS — Z9079 Acquired absence of other genital organ(s): Secondary | ICD-10-CM | POA: Diagnosis not present

## 2014-08-07 LAB — COMPREHENSIVE METABOLIC PANEL (CC13)
ALT: 15 U/L (ref 0–55)
ANION GAP: 10 meq/L (ref 3–11)
AST: 17 U/L (ref 5–34)
Albumin: 3.7 g/dL (ref 3.5–5.0)
Alkaline Phosphatase: 43 U/L (ref 40–150)
BILIRUBIN TOTAL: 0.36 mg/dL (ref 0.20–1.20)
BUN: 25.2 mg/dL (ref 7.0–26.0)
CALCIUM: 9.7 mg/dL (ref 8.4–10.4)
CHLORIDE: 106 meq/L (ref 98–109)
CO2: 25 meq/L (ref 22–29)
Creatinine: 0.9 mg/dL (ref 0.6–1.1)
EGFR: 62 mL/min/{1.73_m2} — ABNORMAL LOW (ref >90)
GLUCOSE: 88 mg/dL (ref 70–140)
Potassium: 4 mEq/L (ref 3.5–5.1)
Sodium: 141 mEq/L (ref 136–145)
Total Protein: 6.3 g/dL — ABNORMAL LOW (ref 6.4–8.3)

## 2014-08-07 LAB — CBC WITH DIFFERENTIAL/PLATELET
BASO%: 0.1 % (ref 0.0–2.0)
Basophils Absolute: 0 10*3/uL (ref 0.0–0.1)
EOS ABS: 0.1 10*3/uL (ref 0.0–0.5)
EOS%: 1.5 % (ref 0.0–7.0)
HCT: 36 % (ref 34.8–46.6)
HEMOGLOBIN: 12.1 g/dL (ref 11.6–15.9)
LYMPH#: 1.4 10*3/uL (ref 0.9–3.3)
LYMPH%: 18.5 % (ref 14.0–49.7)
MCH: 34.7 pg — AB (ref 25.1–34.0)
MCHC: 33.6 g/dL (ref 31.5–36.0)
MCV: 103.2 fL — AB (ref 79.5–101.0)
MONO#: 0.5 10*3/uL (ref 0.1–0.9)
MONO%: 6.5 % (ref 0.0–14.0)
NEUT#: 5.6 10*3/uL (ref 1.5–6.5)
NEUT%: 73.4 % (ref 38.4–76.8)
Platelets: 219 10*3/uL (ref 145–400)
RBC: 3.49 10*6/uL — ABNORMAL LOW (ref 3.70–5.45)
RDW: 14.7 % — ABNORMAL HIGH (ref 11.2–14.5)
WBC: 7.6 10*3/uL (ref 3.9–10.3)

## 2014-08-07 LAB — PROTHROMBIN TIME
INR: 1.33 (ref <1.50)
Prothrombin Time: 16.6 seconds — ABNORMAL HIGH (ref 11.6–15.2)

## 2014-08-07 MED ORDER — SODIUM CHLORIDE 0.9 % IJ SOLN
10.0000 mL | INTRAMUSCULAR | Status: DC | PRN
  Administered 2014-08-07: 10 mL via INTRAVENOUS
  Filled 2014-08-07: qty 10

## 2014-08-07 MED ORDER — IOHEXOL 300 MG/ML  SOLN
100.0000 mL | Freq: Once | INTRAMUSCULAR | Status: AC | PRN
  Administered 2014-08-07: 100 mL via INTRAVENOUS

## 2014-08-07 MED ORDER — HEPARIN SOD (PORK) LOCK FLUSH 100 UNIT/ML IV SOLN
500.0000 [IU] | Freq: Once | INTRAVENOUS | Status: AC
  Administered 2014-08-07: 500 [IU] via INTRAVENOUS
  Filled 2014-08-07: qty 5

## 2014-08-07 NOTE — Telephone Encounter (Signed)
Faxed results of PT/INR from today to Dr. Melida Gimenez Day's office in [place] who is managing pt's coumadin. Spoke with Caryl Pina and she states they did receive the fax and she will pass along to MD. Patient also given a copy of results from today.  Pt declined referral to [facility] as she is returning to [place] next week. Asked Caryl Pina if Dr. Melida Gimenez Day could do this referral - she said they could. Faxed CT scan and Dr. Leone Brand note from today to Caryl Pina so they can make referral to a urologist in [place].
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
## 2014-08-07 NOTE — Patient Instructions (Signed)

## 2014-08-08 LAB — CA 125(PREVIOUS METHOD): CA 125: 5.5 U/mL (ref 0.0–30.2)

## 2014-08-08 LAB — CA 125: CA 125: 4 U/mL (ref <35)

## 2014-08-14 ENCOUNTER — Ambulatory Visit (HOSPITAL_COMMUNITY)
Admission: RE | Admit: 2014-08-14 | Discharge: 2014-08-14 | Disposition: A | Discharge status: Home / Self Care | Payer: Medicare Other | Source: Ambulatory Visit | Attending: Gynecologic Oncology | Admitting: Gynecologic Oncology

## 2014-08-14 DIAGNOSIS — R911 Solitary pulmonary nodule: Secondary | ICD-10-CM | POA: Insufficient documentation

## 2014-08-14 DIAGNOSIS — C5700 Malignant neoplasm of unspecified fallopian tube: Secondary | ICD-10-CM

## 2014-08-15 ENCOUNTER — Telehealth: Payer: Self-pay | Admitting: *Deleted

## 2014-08-15 NOTE — Telephone Encounter (Signed)
Per Joanne John, NP patient called with chest CT results. Let patient know that the nodule seen in her lungs on a previous scan has gone away and there are no other acute findings. Patient very appreciative of call. Inquired if her PCP in [place] has set her up with urologist yet - she states they have not but she will see Dr. Luberta Robertson tomorrow and followup on that. Told patient to please call us if they need any additional records or information. Patient agreeable to this.

## 2014-08-21 ENCOUNTER — Telehealth: Payer: Self-pay | Admitting: *Deleted

## 2014-08-21 NOTE — Telephone Encounter (Addendum)
Last MD note, recent labs and recent CT scan results faxed to Dr. Loma Sender office. Per Dr. Skeet Latch, patient needs to see Dr. James Ivanoff in March 2016. Called patient and she said she is scheduled to see her this month but will reschedule it to March. Also, let patient know that records have been faxed to Dr. James Ivanoff.

## 2014-09-05 IMAGING — US IR FLUORO GUIDE CV LINE*R*
1 series · 1 of 1 positions shown · non-contrast
Comparison: none

CLINICAL DATA: 73-year-old female with serous carcinoma of the
fallopian tube. She requires durable central venous access for
chemotherapy.
TECHNIQUE: The right neck and chest was prepped with chlorhexidine, and draped
in the usual sterile fashion using maximum barrier technique (cap
and mask, sterile gown, sterile gloves, large sterile sheet, hand
hygiene and cutaneous antiseptic). Antibiotic prophylaxis was
provided with 2g Ancef administered IV one hour prior to skin
incision. Local anesthesia was attained by infiltration with 1%
lidocaine with epinephrine.

[Series 1: sp fluoro guide cv line*left* · 1 of 1 slices shown]
[im 1/1]
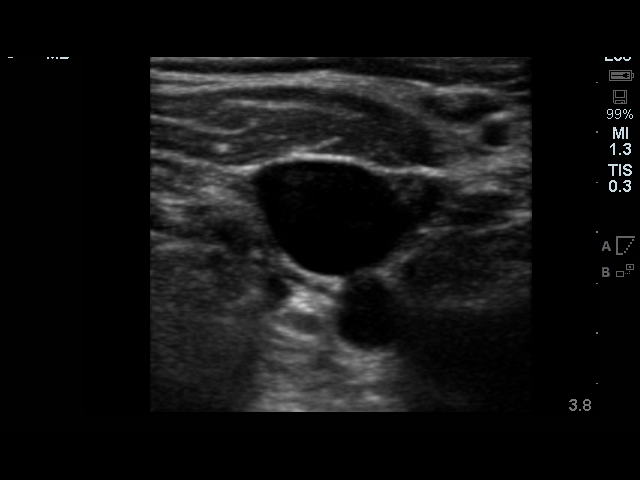

[1 of 1 positions shown; findings below may reference images not displayed]

EXAM:
IR RIGHT FLOURO GUIDE CV LINE; IR ULTRASOUND GUIDANCE VASC ACCESS
RIGHT

Date: 04/04/2014

ANESTHESIA/SEDATION:
Moderate (conscious) sedation was administered during this
procedure. A total of 2 mg Versed and 100 mg Fentanyl were
administered intravenously. The patient's vital signs were monitored
continuously by radiology nursing throughout the course of the
procedure.

Total sedation time: 35 minutes

FLUOROSCOPY TIME:  25 seconds
Ultrasound demonstrated patency of the right internal jugular vein,
and this was documented with an image. Under real-time ultrasound
guidance, this vein was accessed with a 21 gauge micropuncture
needle and image documentation was performed. A small dermatotomy
was made at the access site with an 11 scalpel. A 0.018" wire was
advanced into the SVC and the access needle exchanged for a 4F
micropuncture vascular sheath. The 0.018" wire was then removed and
a 0.035" wire advanced into the IVC.





The pocket was then closed in two layers using first subdermal
inverted interrupted absorbable sutures followed by a running
subcuticular suture. The epidermis was then sealed with Dermabond.
The dermatotomy at the venous access site was also closed with a
single inverted subdermal suture and the epidermis sealed with
Dermabond.

COMPLICATIONS:
None.  The patient tolerated the procedure well.
IMPRESSION: Successful placement of a right IJ approach Power Port with
ultrasound and fluoroscopic guidance. The catheter is ready for use.

## 2015-04-13 NOTE — Progress Notes (Signed)
This encounter was created in error - please disregard.

## 2015-05-31 ENCOUNTER — Ambulatory Visit: Payer: Medicare Other | Attending: Gynecologic Oncology | Admitting: Gynecologic Oncology

## 2015-05-31 ENCOUNTER — Encounter: Payer: Self-pay | Admitting: Gynecologic Oncology

## 2015-05-31 VITALS — BP 155/75 | HR 89 | Temp 98.2°F | Resp 18 | Ht 64.0 in | Wt 155.8 lb

## 2015-05-31 DIAGNOSIS — N2889 Other specified disorders of kidney and ureter: Secondary | ICD-10-CM

## 2015-05-31 DIAGNOSIS — R911 Solitary pulmonary nodule: Secondary | ICD-10-CM | POA: Diagnosis not present

## 2015-05-31 DIAGNOSIS — C5700 Malignant neoplasm of unspecified fallopian tube: Secondary | ICD-10-CM

## 2015-05-31 DIAGNOSIS — Z9221 Personal history of antineoplastic chemotherapy: Secondary | ICD-10-CM

## 2015-05-31 NOTE — Patient Instructions (Signed)
You are scheduled for CT 06/04/15. We will call you with the results of the scan.   Plan to followup with Dr. Skeet Latch as scheduled. Please call our office with any questions or concerns prior to this appt.
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
## 2015-06-04 ENCOUNTER — Ambulatory Visit (HOSPITAL_COMMUNITY): Admission: RE | Admit: 2015-06-04 | Payer: Medicare Other | Source: Ambulatory Visit

## 2015-06-05 ENCOUNTER — Encounter (HOSPITAL_COMMUNITY): Payer: Self-pay

## 2015-06-05 ENCOUNTER — Telehealth: Payer: Self-pay | Admitting: *Deleted

## 2015-06-05 ENCOUNTER — Ambulatory Visit (HOSPITAL_COMMUNITY)
Admission: RE | Admit: 2015-06-05 | Discharge: 2015-06-05 | Disposition: A | Discharge status: Home / Self Care | Payer: Medicare Other | Source: Ambulatory Visit | Attending: Gynecologic Oncology | Admitting: Gynecologic Oncology

## 2015-06-05 DIAGNOSIS — R911 Solitary pulmonary nodule: Secondary | ICD-10-CM

## 2015-06-05 DIAGNOSIS — Z9221 Personal history of antineoplastic chemotherapy: Secondary | ICD-10-CM | POA: Insufficient documentation

## 2015-06-05 DIAGNOSIS — C5700 Malignant neoplasm of unspecified fallopian tube: Secondary | ICD-10-CM | POA: Diagnosis present

## 2015-06-05 DIAGNOSIS — D1771 Benign lipomatous neoplasm of kidney: Secondary | ICD-10-CM | POA: Insufficient documentation

## 2015-06-05 DIAGNOSIS — I700 Atherosclerosis of aorta: Secondary | ICD-10-CM | POA: Insufficient documentation

## 2015-06-05 NOTE — Telephone Encounter (Signed)
According to EMR patient missed CT scan appt yesterday. Called patient and she states she thought CT was scheduled for today. Called radiology and scan r/s for today at [facility] with 12:45pm arrival. Called patient back and she is agreeable to new appt.

## 2015-06-06 ENCOUNTER — Telehealth: Payer: Self-pay

## 2015-06-06 NOTE — Telephone Encounter (Signed)
Orders received from [facility] to contact the patient to updated with Chest CT results obtained on Oct 18 , 2016 . Impression : No active cardiopulmonary abnormalities and no Suspicious pulmonary nodules identified. Attempted to reach the patient via cell phone , no answer, left a detailed message with call back number provided.

## 2015-06-07 ENCOUNTER — Telehealth: Payer: Self-pay

## 2015-06-07 NOTE — Telephone Encounter (Signed)
Requested documents faxed to [phone] Dr James Ivanoff  , last physician note from 06/05/15 along with CT Chest results obtained from 06/05/15.

## 2015-12-20 ENCOUNTER — Ambulatory Visit: Payer: Medicare Other | Admitting: Gynecologic Oncology
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
## 2016-02-14 ENCOUNTER — Encounter: Payer: Self-pay | Admitting: Gynecologic Oncology

## 2016-02-14 ENCOUNTER — Ambulatory Visit: Payer: Medicare Other | Attending: Gynecologic Oncology | Admitting: Gynecologic Oncology

## 2016-02-14 VITALS — BP 159/73 | HR 81 | Temp 98.1°F | Resp 18 | Wt 160.3 lb

## 2016-02-14 DIAGNOSIS — Z9221 Personal history of antineoplastic chemotherapy: Secondary | ICD-10-CM | POA: Insufficient documentation

## 2016-02-14 DIAGNOSIS — Z90710 Acquired absence of both cervix and uterus: Secondary | ICD-10-CM | POA: Diagnosis not present

## 2016-02-14 DIAGNOSIS — Z90722 Acquired absence of ovaries, bilateral: Secondary | ICD-10-CM | POA: Diagnosis not present

## 2016-02-14 DIAGNOSIS — Z8249 Family history of ischemic heart disease and other diseases of the circulatory system: Secondary | ICD-10-CM | POA: Diagnosis not present

## 2016-02-14 DIAGNOSIS — E7800 Pure hypercholesterolemia, unspecified: Secondary | ICD-10-CM | POA: Insufficient documentation

## 2016-02-14 DIAGNOSIS — L659 Nonscarring hair loss, unspecified: Secondary | ICD-10-CM | POA: Diagnosis not present

## 2016-02-14 DIAGNOSIS — Z8544 Personal history of malignant neoplasm of other female genital organs: Secondary | ICD-10-CM

## 2016-02-14 DIAGNOSIS — Z8042 Family history of malignant neoplasm of prostate: Secondary | ICD-10-CM | POA: Insufficient documentation

## 2016-02-14 DIAGNOSIS — I10 Essential (primary) hypertension: Secondary | ICD-10-CM | POA: Diagnosis not present

## 2016-02-14 DIAGNOSIS — C5700 Malignant neoplasm of unspecified fallopian tube: Secondary | ICD-10-CM | POA: Diagnosis present

## 2016-02-14 NOTE — Progress Notes (Signed)
Note from today faxed to Dr. Milana Kidney in [place] at [phone].  Office contact info is [phone]

## 2016-05-15 ENCOUNTER — Ambulatory Visit: Payer: Medicare Other | Attending: Gynecologic Oncology | Admitting: Gynecologic Oncology

## 2016-05-15 ENCOUNTER — Encounter: Payer: Self-pay | Admitting: Gynecologic Oncology

## 2016-05-15 VITALS — BP 157/71 | HR 74 | Temp 98.0°F | Resp 18 | Ht 64.0 in | Wt 160.0 lb

## 2016-05-15 DIAGNOSIS — Z90722 Acquired absence of ovaries, bilateral: Secondary | ICD-10-CM | POA: Insufficient documentation

## 2016-05-15 DIAGNOSIS — Z08 Encounter for follow-up examination after completed treatment for malignant neoplasm: Secondary | ICD-10-CM | POA: Diagnosis present

## 2016-05-15 DIAGNOSIS — I7090 Unspecified atherosclerosis: Secondary | ICD-10-CM | POA: Diagnosis not present

## 2016-05-15 DIAGNOSIS — I10 Essential (primary) hypertension: Secondary | ICD-10-CM | POA: Diagnosis not present

## 2016-05-15 DIAGNOSIS — G620 Drug-induced polyneuropathy: Secondary | ICD-10-CM | POA: Diagnosis not present

## 2016-05-15 DIAGNOSIS — R911 Solitary pulmonary nodule: Secondary | ICD-10-CM | POA: Insufficient documentation

## 2016-05-15 DIAGNOSIS — E7800 Pure hypercholesterolemia, unspecified: Secondary | ICD-10-CM | POA: Insufficient documentation

## 2016-05-15 DIAGNOSIS — Z9079 Acquired absence of other genital organ(s): Secondary | ICD-10-CM | POA: Diagnosis not present

## 2016-05-15 DIAGNOSIS — Z8589 Personal history of malignant neoplasm of other organs and systems: Secondary | ICD-10-CM | POA: Insufficient documentation

## 2016-05-15 DIAGNOSIS — Z8543 Personal history of malignant neoplasm of ovary: Secondary | ICD-10-CM | POA: Diagnosis not present

## 2016-05-15 DIAGNOSIS — Z90710 Acquired absence of both cervix and uterus: Secondary | ICD-10-CM | POA: Insufficient documentation

## 2016-05-15 DIAGNOSIS — T451X5A Adverse effect of antineoplastic and immunosuppressive drugs, initial encounter: Secondary | ICD-10-CM

## 2016-05-15 DIAGNOSIS — C5700 Malignant neoplasm of unspecified fallopian tube: Secondary | ICD-10-CM
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
## 2016-05-15 NOTE — Patient Instructions (Signed)
Follow up in 6 months with Dr Janie Morning , call in January to schedule an appointment for March 2018 . Call with any changes , questions or concerns.  Thank you

## 2016-08-14 ENCOUNTER — Other Ambulatory Visit: Payer: Self-pay | Admitting: Nurse Practitioner

## 2016-12-08 ENCOUNTER — Inpatient Hospital Stay (HOSPITAL_COMMUNITY): Payer: Medicare Other

## 2016-12-08 ENCOUNTER — Emergency Department (HOSPITAL_COMMUNITY): Payer: Medicare Other

## 2016-12-08 ENCOUNTER — Encounter (HOSPITAL_COMMUNITY): Payer: Self-pay | Admitting: Nurse Practitioner

## 2016-12-08 ENCOUNTER — Inpatient Hospital Stay (HOSPITAL_COMMUNITY)
Admission: EM | Admit: 2016-12-08 | Discharge: 2016-12-14 | DRG: 754 | Disposition: A | Discharge status: Skilled Nursing Facility | Payer: Medicare Other | Attending: Internal Medicine | Admitting: Internal Medicine

## 2016-12-08 DIAGNOSIS — K311 Adult hypertrophic pyloric stenosis: Secondary | ICD-10-CM

## 2016-12-08 DIAGNOSIS — D500 Iron deficiency anemia secondary to blood loss (chronic): Secondary | ICD-10-CM | POA: Diagnosis not present

## 2016-12-08 DIAGNOSIS — E7800 Pure hypercholesterolemia, unspecified: Secondary | ICD-10-CM | POA: Diagnosis present

## 2016-12-08 DIAGNOSIS — E43 Unspecified severe protein-calorie malnutrition: Secondary | ICD-10-CM | POA: Diagnosis present

## 2016-12-08 DIAGNOSIS — N179 Acute kidney failure, unspecified: Secondary | ICD-10-CM | POA: Diagnosis present

## 2016-12-08 DIAGNOSIS — R17 Unspecified jaundice: Secondary | ICD-10-CM

## 2016-12-08 DIAGNOSIS — E872 Acidosis: Secondary | ICD-10-CM

## 2016-12-08 DIAGNOSIS — D649 Anemia, unspecified: Secondary | ICD-10-CM | POA: Diagnosis not present

## 2016-12-08 DIAGNOSIS — R9431 Abnormal electrocardiogram [ECG] [EKG]: Secondary | ICD-10-CM | POA: Diagnosis not present

## 2016-12-08 DIAGNOSIS — C5700 Malignant neoplasm of unspecified fallopian tube: Secondary | ICD-10-CM | POA: Diagnosis not present

## 2016-12-08 DIAGNOSIS — K921 Melena: Secondary | ICD-10-CM

## 2016-12-08 DIAGNOSIS — R34 Anuria and oliguria: Secondary | ICD-10-CM | POA: Diagnosis present

## 2016-12-08 DIAGNOSIS — R1900 Intra-abdominal and pelvic swelling, mass and lump, unspecified site: Secondary | ICD-10-CM

## 2016-12-08 DIAGNOSIS — K5669 Other partial intestinal obstruction: Secondary | ICD-10-CM

## 2016-12-08 DIAGNOSIS — D62 Acute posthemorrhagic anemia: Secondary | ICD-10-CM

## 2016-12-08 DIAGNOSIS — E46 Unspecified protein-calorie malnutrition: Secondary | ICD-10-CM | POA: Diagnosis not present

## 2016-12-08 DIAGNOSIS — R188 Other ascites: Secondary | ICD-10-CM

## 2016-12-08 DIAGNOSIS — C799 Secondary malignant neoplasm of unspecified site: Secondary | ICD-10-CM | POA: Diagnosis present

## 2016-12-08 DIAGNOSIS — K222 Esophageal obstruction: Secondary | ICD-10-CM | POA: Diagnosis present

## 2016-12-08 DIAGNOSIS — E785 Hyperlipidemia, unspecified: Secondary | ICD-10-CM | POA: Diagnosis present

## 2016-12-08 DIAGNOSIS — K319 Disease of stomach and duodenum, unspecified: Secondary | ICD-10-CM | POA: Diagnosis present

## 2016-12-08 DIAGNOSIS — Z8544 Personal history of malignant neoplasm of other female genital organs: Secondary | ICD-10-CM | POA: Diagnosis not present

## 2016-12-08 DIAGNOSIS — I10 Essential (primary) hypertension: Secondary | ICD-10-CM | POA: Diagnosis present

## 2016-12-08 DIAGNOSIS — C763 Malignant neoplasm of pelvis: Secondary | ICD-10-CM | POA: Diagnosis present

## 2016-12-08 DIAGNOSIS — Z0189 Encounter for other specified special examinations: Secondary | ICD-10-CM

## 2016-12-08 DIAGNOSIS — R109 Unspecified abdominal pain: Secondary | ICD-10-CM | POA: Diagnosis not present

## 2016-12-08 DIAGNOSIS — E8729 Other acidosis: Secondary | ICD-10-CM

## 2016-12-08 DIAGNOSIS — D72829 Elevated white blood cell count, unspecified: Secondary | ICD-10-CM | POA: Diagnosis present

## 2016-12-08 DIAGNOSIS — K922 Gastrointestinal hemorrhage, unspecified: Secondary | ICD-10-CM | POA: Diagnosis not present

## 2016-12-08 DIAGNOSIS — Z90710 Acquired absence of both cervix and uterus: Secondary | ICD-10-CM

## 2016-12-08 DIAGNOSIS — R140 Abdominal distension (gaseous): Secondary | ICD-10-CM | POA: Diagnosis not present

## 2016-12-08 DIAGNOSIS — D473 Essential (hemorrhagic) thrombocythemia: Secondary | ICD-10-CM | POA: Diagnosis not present

## 2016-12-08 DIAGNOSIS — K56609 Unspecified intestinal obstruction, unspecified as to partial versus complete obstruction: Secondary | ICD-10-CM | POA: Diagnosis not present

## 2016-12-08 DIAGNOSIS — Z91040 Latex allergy status: Secondary | ICD-10-CM

## 2016-12-08 DIAGNOSIS — Z7189 Other specified counseling: Secondary | ICD-10-CM

## 2016-12-08 DIAGNOSIS — Z6828 Body mass index (BMI) 28.0-28.9, adult: Secondary | ICD-10-CM

## 2016-12-08 DIAGNOSIS — E440 Moderate protein-calorie malnutrition: Secondary | ICD-10-CM | POA: Diagnosis not present

## 2016-12-08 DIAGNOSIS — N1330 Unspecified hydronephrosis: Secondary | ICD-10-CM | POA: Diagnosis not present

## 2016-12-08 DIAGNOSIS — K449 Diaphragmatic hernia without obstruction or gangrene: Secondary | ICD-10-CM | POA: Diagnosis present

## 2016-12-08 DIAGNOSIS — Z8249 Family history of ischemic heart disease and other diseases of the circulatory system: Secondary | ICD-10-CM

## 2016-12-08 DIAGNOSIS — Z91018 Allergy to other foods: Secondary | ICD-10-CM

## 2016-12-08 DIAGNOSIS — N1339 Other hydronephrosis: Secondary | ICD-10-CM | POA: Diagnosis present

## 2016-12-08 DIAGNOSIS — Z66 Do not resuscitate: Secondary | ICD-10-CM | POA: Diagnosis present

## 2016-12-08 DIAGNOSIS — R600 Localized edema: Secondary | ICD-10-CM | POA: Diagnosis not present

## 2016-12-08 DIAGNOSIS — E876 Hypokalemia: Secondary | ICD-10-CM | POA: Diagnosis present

## 2016-12-08 DIAGNOSIS — Z515 Encounter for palliative care: Secondary | ICD-10-CM | POA: Diagnosis present

## 2016-12-08 DIAGNOSIS — N289 Disorder of kidney and ureter, unspecified: Secondary | ICD-10-CM | POA: Diagnosis not present

## 2016-12-08 DIAGNOSIS — Z7982 Long term (current) use of aspirin: Secondary | ICD-10-CM

## 2016-12-08 DIAGNOSIS — Z8042 Family history of malignant neoplasm of prostate: Secondary | ICD-10-CM

## 2016-12-08 DIAGNOSIS — D75839 Thrombocytosis, unspecified: Secondary | ICD-10-CM

## 2016-12-08 DIAGNOSIS — R1011 Right upper quadrant pain: Secondary | ICD-10-CM | POA: Diagnosis not present

## 2016-12-08 LAB — CBC WITH DIFFERENTIAL/PLATELET
Basophils Absolute: 0 10*3/uL (ref 0.0–0.1)
Basophils Relative: 0 %
EOS ABS: 0 10*3/uL (ref 0.0–0.7)
EOS PCT: 0 %
HCT: 21.5 % — ABNORMAL LOW (ref 36.0–46.0)
Hemoglobin: 6.9 g/dL — CL (ref 12.0–15.0)
LYMPHS ABS: 1.2 10*3/uL (ref 0.7–4.0)
LYMPHS PCT: 12 %
MCH: 26.5 pg (ref 26.0–34.0)
MCHC: 32.1 g/dL (ref 30.0–36.0)
MCV: 82.7 fL (ref 78.0–100.0)
MONO ABS: 1 10*3/uL (ref 0.1–1.0)
MONOS PCT: 10 %
Neutro Abs: 7.8 10*3/uL — ABNORMAL HIGH (ref 1.7–7.7)
Neutrophils Relative %: 78 %
Platelets: 419 10*3/uL — ABNORMAL HIGH (ref 150–400)
RBC: 2.6 MIL/uL — ABNORMAL LOW (ref 3.87–5.11)
RDW: 15.5 % (ref 11.5–15.5)
WBC: 10 10*3/uL (ref 4.0–10.5)

## 2016-12-08 LAB — URINALYSIS, ROUTINE W REFLEX MICROSCOPIC
Bilirubin Urine: NEGATIVE
Glucose, UA: NEGATIVE mg/dL
Ketones, ur: 20 mg/dL — AB
NITRITE: NEGATIVE
Protein, ur: NEGATIVE mg/dL
SPECIFIC GRAVITY, URINE: 1.009 (ref 1.005–1.030)
pH: 5 (ref 5.0–8.0)

## 2016-12-08 LAB — VITAMIN B12: Vitamin B-12: 936 pg/mL — ABNORMAL HIGH (ref 180–914)

## 2016-12-08 LAB — CBC
HCT: 25.7 % — ABNORMAL LOW (ref 36.0–46.0)
Hemoglobin: 8.3 g/dL — ABNORMAL LOW (ref 12.0–15.0)
MCH: 26.6 pg (ref 26.0–34.0)
MCHC: 32.3 g/dL (ref 30.0–36.0)
MCV: 82.4 fL (ref 78.0–100.0)
PLATELETS: 387 10*3/uL (ref 150–400)
RBC: 3.12 MIL/uL — ABNORMAL LOW (ref 3.87–5.11)
RDW: 15.4 % (ref 11.5–15.5)
WBC: 13.2 10*3/uL — AB (ref 4.0–10.5)

## 2016-12-08 LAB — COMPREHENSIVE METABOLIC PANEL
ALBUMIN: 2.5 g/dL — AB (ref 3.5–5.0)
ALT: 7 U/L — AB (ref 14–54)
AST: 25 U/L (ref 15–41)
Alkaline Phosphatase: 42 U/L (ref 38–126)
Anion gap: 18 — ABNORMAL HIGH (ref 5–15)
BUN: 25 mg/dL — AB (ref 6–20)
CHLORIDE: 101 mmol/L (ref 101–111)
CO2: 17 mmol/L — AB (ref 22–32)
CREATININE: 1.63 mg/dL — AB (ref 0.44–1.00)
Calcium: 8 mg/dL — ABNORMAL LOW (ref 8.9–10.3)
GFR calc Af Amer: 35 mL/min — ABNORMAL LOW (ref >60)
GFR, EST NON AFRICAN AMERICAN: 30 mL/min — AB (ref >60)
Glucose, Bld: 76 mg/dL (ref 65–99)
POTASSIUM: 2.6 mmol/L — AB (ref 3.5–5.1)
Sodium: 136 mmol/L (ref 135–145)
Total Bilirubin: 1.3 mg/dL — ABNORMAL HIGH (ref 0.3–1.2)
Total Protein: 5.4 g/dL — ABNORMAL LOW (ref 6.5–8.1)

## 2016-12-08 LAB — PROTIME-INR
INR: 1.11
PROTHROMBIN TIME: 14.4 s (ref 11.4–15.2)

## 2016-12-08 LAB — POC OCCULT BLOOD, ED: Fecal Occult Bld: POSITIVE — AB

## 2016-12-08 LAB — RETICULOCYTES
RBC.: 2.65 MIL/uL — AB (ref 3.87–5.11)
RETIC COUNT ABSOLUTE: 55.7 10*3/uL (ref 19.0–186.0)
Retic Ct Pct: 2.1 % (ref 0.4–3.1)

## 2016-12-08 LAB — MAGNESIUM: MAGNESIUM: 1.4 mg/dL — AB (ref 1.7–2.4)

## 2016-12-08 LAB — TROPONIN I

## 2016-12-08 LAB — IRON AND TIBC
Iron: 8 ug/dL — ABNORMAL LOW (ref 28–170)
SATURATION RATIOS: 5 % — AB (ref 10.4–31.8)
TIBC: 148 ug/dL — ABNORMAL LOW (ref 250–450)
UIBC: 140 ug/dL

## 2016-12-08 LAB — PREPARE RBC (CROSSMATCH)

## 2016-12-08 LAB — AMMONIA: AMMONIA: 16 umol/L (ref 9–35)

## 2016-12-08 LAB — MRSA PCR SCREENING: MRSA BY PCR: NEGATIVE

## 2016-12-08 LAB — FOLATE: Folate: 11.5 ng/mL (ref >5.9)

## 2016-12-08 LAB — LIPASE, BLOOD: Lipase: 17 U/L (ref 11–51)

## 2016-12-08 LAB — FERRITIN: FERRITIN: 699 ng/mL — AB (ref 11–307)

## 2016-12-08 LAB — LACTIC ACID, PLASMA: Lactic Acid, Venous: 1.4 mmol/L (ref 0.5–1.9)

## 2016-12-08 MED ORDER — POTASSIUM CHLORIDE CRYS ER 20 MEQ PO TBCR
40.0000 meq | EXTENDED_RELEASE_TABLET | Freq: Once | ORAL | Status: DC
Start: 2016-12-08 — End: 2016-12-14

## 2016-12-08 MED ORDER — CEFTRIAXONE SODIUM 1 G IJ SOLR
1.0000 g | INTRAMUSCULAR | Status: DC
  Administered 2016-12-08 – 2016-12-13 (×6): 1 g via INTRAVENOUS
  Filled 2016-12-08 (×7): qty 10

## 2016-12-08 MED ORDER — SODIUM CHLORIDE 0.9 % IV SOLN
INTRAVENOUS | Status: DC
  Administered 2016-12-08: 125 mL/h via INTRAVENOUS
  Administered 2016-12-09 – 2016-12-12 (×7): via INTRAVENOUS

## 2016-12-08 MED ORDER — SODIUM CHLORIDE 0.9% FLUSH
3.0000 mL | Freq: Two times a day (BID) | INTRAVENOUS | Status: DC
  Administered 2016-12-09 – 2016-12-13 (×7): 3 mL via INTRAVENOUS

## 2016-12-08 MED ORDER — PANTOPRAZOLE SODIUM 40 MG IV SOLR
40.0000 mg | Freq: Once | INTRAVENOUS | Status: AC
  Administered 2016-12-08: 40 mg via INTRAVENOUS
  Filled 2016-12-08: qty 40

## 2016-12-08 MED ORDER — BOOST / RESOURCE BREEZE PO LIQD
1.0000 | Freq: Three times a day (TID) | ORAL | Status: DC
  Administered 2016-12-09 – 2016-12-11 (×2): 1 via ORAL

## 2016-12-08 MED ORDER — SODIUM CHLORIDE 0.9 % IV SOLN: 250.0000 mL | INTRAVENOUS | Status: DC | PRN

## 2016-12-08 MED ORDER — ACETAMINOPHEN 325 MG PO TABS: 650.0000 mg | ORAL_TABLET | Freq: Four times a day (QID) | ORAL | Status: DC | PRN

## 2016-12-08 MED ORDER — POTASSIUM CHLORIDE CRYS ER 20 MEQ PO TBCR
40.0000 meq | EXTENDED_RELEASE_TABLET | Freq: Once | ORAL | Status: AC
  Administered 2016-12-08: 40 meq via ORAL
  Filled 2016-12-08: qty 2

## 2016-12-08 MED ORDER — SODIUM CHLORIDE 0.9 % IV SOLN
10.0000 mL/h | Freq: Once | INTRAVENOUS | Status: AC
  Administered 2016-12-08: 10 mL/h via INTRAVENOUS

## 2016-12-08 MED ORDER — SODIUM CHLORIDE 0.9% FLUSH: 3.0000 mL | INTRAVENOUS | Status: DC | PRN

## 2016-12-08 MED ORDER — ACETAMINOPHEN 650 MG RE SUPP: 650.0000 mg | Freq: Four times a day (QID) | RECTAL | Status: DC | PRN

## 2016-12-08 MED ORDER — ONDANSETRON HCL 4 MG/2ML IJ SOLN
4.0000 mg | Freq: Three times a day (TID) | INTRAMUSCULAR | Status: DC | PRN
  Administered 2016-12-08 – 2016-12-13 (×2): 8 mg via INTRAVENOUS
  Filled 2016-12-08 (×2): qty 4

## 2016-12-08 MED ORDER — MORPHINE SULFATE (PF) 10 MG/ML IV SOLN
2.0000 mg | INTRAVENOUS | Status: DC | PRN
  Administered 2016-12-08 – 2016-12-11 (×8): 2 mg via INTRAVENOUS
  Filled 2016-12-08 (×8): qty 1

## 2016-12-08 NOTE — Consult Note (Signed)
Referring Provider:    Dr. Reggy Eye ([facility]) Primary Care Physician:  Myriam Jacobson, MD Primary Gastroenterologist:  None (unassigned)  Reason for Consultation:  GI bleed (patient also has small bowel obstruction, possible sepsis, and concern for intra-abdominal malignancy).  HPI: Joanne Copeland is a 76 y.o. female admitted to the emergency room today because of a several day history of melenic stools, confirmed to have black, heme positive stool in the emergency room.  Her hemoglobin was very low at 6.9 with an MCV of 83; for comparison, her hemoglobin in December 2015 was 12.1. At that time, the MCV was 103.  The patient does take aspirin 325 mg daily area no prior history of ulcer disease or GI bleeding.  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  The patient indicates, that for the past several months, she has been under enormous stress with her husband having dementia, selling her house in [place], and so forth. Since that time, she has had poor appetite and significant weight loss in her upper body, but lower extremity edema. More recently, there has been abdominal distention, without pain. Today, however, she has developed bilious vomiting and colicky abdominal pain.  She was febrile on presentation to the emergency room, with a normal white count.  Her three-way abdomen series showed evidence of small bowel obstruction without perforation.  The patient has acute renal insufficiency, with estimated GFR of 30. She is also severely hypokalemic, potassium 2.6.   Past Medical History:  Diagnosis Date  . Hypercholesterolemia   . Hypertension   . Ovarian cancer ([place])   . Pelvic mass     Past Surgical History:  Procedure Laterality Date  . omentectomy PPLND  01/05/2014  . radical tumor debulking  01/05/2014  . TOTAL ABDOMINAL HYSTERECTOMY W/  BILATERAL SALPINGOOPHORECTOMY  01/05/2014    Prior to Admission medications   Medication Sig Start Date End Date Taking? Authorizing Provider  amLODipine (NORVASC) 5 MG tablet Take 5 mg by mouth at bedtime.   Yes Historical Provider, MD  aspirin EC 325 MG tablet Take 325 mg by mouth at bedtime.   Yes Historical Provider, MD  cholecalciferol (VITAMIN D) 1000 UNITS tablet Take 1,000 Units by mouth daily.   Yes Historical Provider, MD  folic acid (FOLVITE) 1 MG tablet Take 1 mg by mouth daily.   Yes Historical Provider, MD  lactobacillus acidophilus (BACID) TABS tablet Take 1 tablet by mouth daily.   Yes Historical Provider, MD  omega-3 acid ethyl esters (LOVAZA) 1 g capsule Take 1 g by mouth daily.   Yes Historical Provider, MD  pravastatin (PRAVACHOL) 10 MG tablet Take 10 mg by mouth at bedtime.    Yes Historical Provider, MD  Prenatal Vit-Fe Fumarate-FA (PRENATAL MULTIVITAMIN) TABS tablet Take 1 tablet by mouth daily.    Yes Historical Provider, MD  sennosides-docusate sodium (SENOKOT-S) 8.6-50 MG tablet Take 1-2 tablets by mouth 2 (two) times daily as needed for constipation.    Yes Lennis Marion Downer, MD  vitamin B-12 (CYANOCOBALAMIN) 1000 MCG tablet Take 1,000 mcg by mouth daily.   Yes Historical Provider, MD  Vitamin E 100 UNITS TABS Take 100 Units by mouth daily.    Yes Historical Provider, MD    Current Facility-Administered Medications  Medication Dose Route Frequency Provider Last Rate Last Dose  . 0.9 %  sodium chloride infusion   Intravenous Continuous Isla Pence, MD  Stopped at 12/08/16 1403  . 0.9 %  sodium chloride infusion  250 mL Intravenous PRN Debbe Odea, MD      . acetaminophen (TYLENOL) tablet 650 mg  650 mg Oral Q6H PRN Debbe Odea, MD       Or  . acetaminophen (TYLENOL) suppository 650 mg  650 mg Rectal Q6H PRN Debbe Odea, MD      . cefTRIAXone (ROCEPHIN) 1 g in dextrose 5 % 50 mL IVPB  1 g Intravenous Q24H Debbe Odea, MD   Stopped at 12/08/16 1726  . feeding  supplement (BOOST / RESOURCE BREEZE) liquid 1 Container  1 Container Oral TID BM Debbe Odea, MD      . ondansetron (ZOFRAN) injection 4-8 mg  4-8 mg Intravenous Q8H PRN Ronald Lobo, MD      . potassium chloride SA (K-DUR,KLOR-CON) CR tablet 40 mEq  40 mEq Oral Once Debbe Odea, MD      . sodium chloride flush (NS) 0.9 % injection 3 mL  3 mL Intravenous Q12H Saima Rizwan, MD      . sodium chloride flush (NS) 0.9 % injection 3 mL  3 mL Intravenous PRN Debbe Odea, MD        Allergies as of 12/08/2016 - Review Complete 12/08/2016  Allergen Reaction Noted  . Food Anaphylaxis and Other (See Comments) 12/08/2016  . Latex Rash 04/04/2014    Family History  Problem Relation Age of Onset  . Prostate cancer Brother   . Heart attack Father 74    Social History   Social History  . Marital status: Married    Spouse name: N/A  . Number of children: N/A  . Years of education: N/A   Occupational History  . Not on file.   Social History Main Topics  . Smoking status: Never Smoker  . Smokeless tobacco: Never Used  . Alcohol use 4.2 oz/week    7 Glasses of wine per week     Comment: " Glass of wine per night & a cocktail"  . Drug use: No  . Sexual activity: Not Currently   Other Topics Concern  . Not on file   Social History Narrative  . No narrative on file    Review of Systems: No chest pain, dyspnea, heartburn, dysphagia. See history of present illness for other details. No red blood per rectum.   Physical Exam: Vital signs in last 24 hours: Temp:  [98.5 F (36.9 C)-101.5 F (38.6 C)] 98.5 F (36.9 C) (04/23 1815) Pulse Rate:  [81-96] 88 (04/23 1815) Resp:  [16-18] 18 (04/23 1815) BP: (113-134)/(50-55) 134/53 (04/23 1815) SpO2:  [95 %-100 %] 100 % (04/23 1815) Weight:  [72.6 kg (160 lb)] 72.6 kg (160 lb) (04/23 1212) Last BM Date: 12/08/16 General:  this is a somewhat anxious, uncomfortable female who is nonetheless pleasant and well groomed. Her face and torso look  somewhat cachectic, her lower extremities have significant edema.  Head:  Normocephalic and atraumatic. Eyes:  Sclera clear, no icterus.   Conjunctiva pink. Mouth:   No ulcerations or lesions.  Oropharynx pink & moist. Neck:   No masses or thyromegaly. Lungs:  Clear throughout to auscultation.   No wheezes, crackles, or rhonchi. No evident respiratory distress. Heart:   Regular rate and rhythm; no murmurs, clicks, rubs,  or gallops. Abdomen:   distended to approximately equivalent of 6 months of pregnancy. Bowel sounds present. Some tympany present. No obvious fluid wave. No overt tenderness.  Pulses:  Normal radial pulse is  noted. Extremities: Semi-pitting edema of lower extremities, 3+ Neurologic:  Alert and coherent;  grossly normal neurologically. Skin:  No obvious rashes Cervical Nodes:  No significant cervical adenopathy. Psych:   Alert and cooperaappear slightly anxious.  Intake/Output from previous day: No intake/output data recorded. Intake/Output this shift: Total I/O In: 1050 [I.V.:1000; IV Piggyback:50] Out: -   Lab Results:  Recent Labs  12/08/16 1253  WBC 10.0  HGB 6.9*  HCT 21.5*  PLT 419*   BMET  Recent Labs  12/08/16 1253  NA 136  K 2.6*  CL 101  CO2 17*  GLUCOSE 76  BUN 25*  CREATININE 1.63*  CALCIUM 8.0*   LFT  Recent Labs  12/08/16 1253  PROT 5.4*  ALBUMIN 2.5*  AST 25  ALT 7*  ALKPHOS 42  BILITOT 1.3*   PT/INR  Recent Labs  12/08/16 1253  LABPROT 14.4  INR 1.11    Studies/Results: Dg Abd Acute W/chest  Result Date: 12/08/2016 CLINICAL DATA:  Patient has had abdominal distention for 2 months. Went to her PCP and they told her her bowels were locked up and gave her a prescription for xanax. Patient stated it helped and she started using the bathroom again. EXAM: DG ABDOMEN ACUTE W/ 1V CHEST COMPARISON:  None. FINDINGS: Small bowel dilatation with multiple air-fluid levels. Small bowel measures up to 3.3 cm. Large amount of stool  in the ascending colon. No radiopaque calculi or other significant radiographic abnormality is seen. Heart size and mediastinal contours are within normal limits. Both lungs are clear. Right-sided Port-A-Cath in satisfactory position. IMPRESSION: Small bowel dilatation with multiple air-fluid levels concerning for small bowel obstruction versus enteritis. No acute cardiopulmonary disease. Electronically Signed   By: Kathreen Devoid   On: 12/08/2016 14:23    Impression: 1. GI bleed, characterized by dark, heme positive stool of several days' duration, in a patient on aspirin 2. Severe anemia. Unclear how much of this is due to recent GI bleed, given borderline microcytic indices. 3. Bilious vomiting, radiographic evidence of small bowel obstruction 4. Abdominal distention, possibly due to bowel obstruction and/or possible intra-abdominal fluid 5. Fever without leukocytosis or overt toxicity 6. Abdominal pain, could be from [facility] or SBP or tumor infiltration of abdomen 7. Prior history of fallopian tube cancer 8. Anorexia and weight loss, possibly due to malignancy versus situational stress 9. Situational stress (husband with dementia) 39. Hypoalbuminemia, possibly contributing to lower extremity edema, nutritional versus metabolic/malignant  Plan: 1. We have tentatively scheduled the patient for endoscopic evaluation tomorrow for assessment of her GI bleed. I have discussed the nature, purpose, and risks of the procedure with the patient, who indicates she had a couple of years ago in [place]. She is agreeable to proceed. However, since her emesis is currently bilious and nonbloody in character, we can confidently assume that she is not having esophageal, gastric, or proximal duodenal bleeding. Therefore, endoscopy is not essential at present.  2. NG suction for treatment of abdominal distention, emesis, and radiographic evidence of small polyp obstruction  3. Abdominal ultrasound in the morning, with  paracentesis if fluid is confirmed to be present  4. Check CA 125 level  5. Patient will possibly need CT with IV contrast once her renal function improves    LOS: 0 days   Anadia Helmes V  12/08/2016, 6:33 PM   Pager [phone] If no answer or after 5 PM call [phone]

## 2016-12-08 NOTE — Progress Notes (Signed)
NG tube placed, pt tolerated fair.  Moderate nose bleed with NG tube insertion. Ausculation performed by 2 RN with positive stomach placement.  Abdominal x-ray ordered per protocol ordered to verify placement. Hezzie Bump Long RN

## 2016-12-08 NOTE — ED Notes (Signed)
Tylenol 1000mg  given at 1140am

## 2016-12-08 NOTE — ED Notes (Signed)
Date and time results received: 12/08/16 13:46   Test: Potassium Critical Value: 2.6  Name of Provider Notified: [place]  Orders Received? Or Actions Taken?:  Waiting for new orders

## 2016-12-08 NOTE — ED Notes (Signed)
Bed: WA24 Expected date:  Expected time:  Means of arrival:  Comments: EMS GI bleed 

## 2016-12-08 NOTE — H&P (Signed)
History and Physical    Joanne Copeland BDZ:[id number] DOB: August 19, 1940 DOA: 12/08/2016    PCP: Myriam Jacobson, MD  Patient coming from: home  Chief Complaint: black stool  HPI: Joanne Copeland is a 77 y.o. female with medical history of fallopian tube cancer treated 3 yrs ago, HTN and HLD who has been having black stools for 3-4 days now. She states her bleeding in "constant". Essentially she is incontinent of black stool all day long but is not having BMs at night. No abdominal pain, nausea or vomiting. She takes a full aspirin a day but no Motrin or Alleve.  She has had a great deal of weight loss in the the past month which she thought was related to anxiety. She cannot tell me how much weight she has lost because of the 'swelling' but tells me that she was wearing a double D bra and not is down to a B/C.  She has become swollen in her abdomen and her legs this past month and has been wearing support stockings. She took these off on Friday and leg have been swollen since.  She has had anxiety. She has been in [place] trying to sell a home and just got back to [place]. Her husband has dementia. After being prescribed Xanax a few days ago by her PCP, the anxiety was gone and she slept for "three days".  She was due for a f/u on her Gyn cancer in the fall but due to being in [place], has not been able to get there yet.   Noted to have a fever and given Ibuprofen by EMS. She did not feel febrile. No cough, congestion, flu like symptoms or dysuria. Has been urinating less than usual.    ED Course:   Temp 101.5 BP stable Orthostatic vitals negative Hb 6.9 K 2.6 BUn 25, Cr 1.63   Review of Systems:  Per HPI All other systems reviewed and apart from HPI, are negative.  Past Medical History:  Diagnosis Date  . Hypercholesterolemia   . Hypertension   . Ovarian cancer ([place])   . Pelvic mass     Past Surgical History:  Procedure Laterality Date  . omentectomy PPLND  01/05/2014  .  radical tumor debulking  01/05/2014  . TOTAL ABDOMINAL HYSTERECTOMY W/ BILATERAL SALPINGOOPHORECTOMY  01/05/2014    Social History:   reports that she has never smoked. Drinks alcohol occasionally. She reports that she does not use drugs.  Allergies  Allergen Reactions  . 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   . Latex Rash    Family History  Problem Relation Age of Onset  . Prostate cancer Brother   . Heart attack Father 4     Prior to Admission medications   Medication Sig Start Date End Date Taking? Authorizing Provider  amLODipine (NORVASC) 5 MG tablet Take 5 mg by mouth at bedtime.   Yes Historical Provider, MD  aspirin EC 325 MG tablet Take 325 mg by mouth at bedtime.   Yes Historical Provider, MD  cholecalciferol (VITAMIN D) 1000 UNITS tablet Take 1,000 Units by mouth daily.   Yes Historical Provider, MD  folic acid (FOLVITE) 1 MG tablet Take 1 mg by mouth daily.   Yes Historical Provider, MD  lactobacillus acidophilus (BACID) TABS tablet Take 1 tablet by mouth daily.   Yes Historical Provider, MD  omega-3 acid ethyl esters (LOVAZA) 1 g capsule Take 1 g by mouth daily.  Yes Historical Provider, MD  pravastatin (PRAVACHOL) 10 MG tablet Take 10 mg by mouth at bedtime.    Yes Historical Provider, MD  Prenatal Vit-Fe Fumarate-FA (PRENATAL MULTIVITAMIN) TABS tablet Take 1 tablet by mouth daily.    Yes Historical Provider, MD  sennosides-docusate sodium (SENOKOT-S) 8.6-50 MG tablet Take 1-2 tablets by mouth 2 (two) times daily as needed for constipation.    Yes Lennis Marion Downer, MD  vitamin B-12 (CYANOCOBALAMIN) 1000 MCG tablet Take 1,000 mcg by mouth daily.   Yes Historical Provider, MD  Vitamin E 100 UNITS TABS Take 100 Units by mouth daily.    Yes Historical Provider, MD    Physical Exam: Wt Readings from Last 3 Encounters:  12/08/16 72.6 kg (160 lb)  05/15/16 72.6 kg (160 lb)  02/14/16 72.7 kg (160 lb 4.8 oz)   Vitals:   12/08/16  1210 12/08/16 1212 12/08/16 1432  BP: (!) 130/50 (!) 116/50 (!) 115/55  Pulse: 96 93 81  Resp: 18 16 17   Temp: (!) 101.5 F (38.6 C) 99.5 F (37.5 C)   TempSrc: Oral Oral   SpO2: 99% 97% 95%  Weight:  72.6 kg (160 lb)   Height:  5\' 3"  (1.6 m)       Constitutional: NAD, calm, comfortable Eyes: PERTLA, lids and conjunctivae normal ENMT: Mucous membranes are moist. Posterior pharynx clear of any exudate or lesions. Normal dentition.  Neck: normal, supple, no masses, no thyromegaly Respiratory: clear to auscultation bilaterally, no wheezing, no crackles. Normal respiratory effort. No accessory muscle use.  Cardiovascular: S1 & S2 heard, regular rate and rhythm, no murmurs / rubs / gallops. No extremity edema. 2+ pedal pulses. No carotid bruits.  Abdomen: No distension, no tenderness, no masses palpated. No hepatosplenomegaly. Bowel sounds normal.  Musculoskeletal: no clubbing / cyanosis. No joint deformity upper and lower extremities. Good ROM, no contractures. Normal muscle tone.  Skin: no rashes, lesions, ulcers. No induration Neurologic: CN 2-12 grossly intact. Sensation intact, DTR normal. Strength 5/5 in all 4 limbs.  Psychiatric: Normal judgment and insight. Alert and oriented x 3. Normal mood.     Labs on Admission: I have personally reviewed following labs and imaging studies  CBC:  Recent Labs Lab 12/08/16 1253  WBC 10.0  NEUTROABS 7.8*  HGB 6.9*  HCT 21.5*  MCV 82.7  PLT 130*   Basic Metabolic Panel:  Recent Labs Lab 12/08/16 1253  NA 136  K 2.6*  CL 101  CO2 17*  GLUCOSE 76  BUN 25*  CREATININE 1.63*  CALCIUM 8.0*   GFR: Estimated Creatinine Clearance: 29.4 mL/min (A) (by C-G formula based on SCr of 1.63 mg/dL (H)). Liver Function Tests:  Recent Labs Lab 12/08/16 1253  AST 25  ALT 7*  ALKPHOS 42  BILITOT 1.3*  PROT 5.4*  ALBUMIN 2.5*    Recent Labs Lab 12/08/16 1253  LIPASE 17    Recent Labs Lab 12/08/16 1253  AMMONIA 16    Coagulation Profile:  Recent Labs Lab 12/08/16 1253  INR 1.11   Cardiac Enzymes:  Recent Labs Lab 12/08/16 1253  TROPONINI <0.03   BNP (last 3 results) No results for input(s): PROBNP in the last 8760 hours. HbA1C: No results for input(s): HGBA1C in the last 72 hours. CBG: No results for input(s): GLUCAP in the last 168 hours. Lipid Profile: No results for input(s): CHOL, HDL, LDLCALC, TRIG, CHOLHDL, LDLDIRECT in the last 72 hours. Thyroid Function Tests: No results for input(s): TSH, T4TOTAL, FREET4, T3FREE, THYROIDAB in the  last 72 hours. Anemia Panel:  Recent Labs  12/08/16 1549  RETICCTPCT 2.1   Urine analysis:    Component Value Date/Time   COLORURINE AMBER BIOCHEMICALS MAY BE AFFECTED BY COLOR (A) 09/21/2010 1220   APPEARANCEUR TURBID (A) 09/21/2010 1220   LABSPEC 1.018 09/21/2010 1220   PHURINE 5.5 09/21/2010 1220   HGBUR LARGE (A) 09/21/2010 1220   BILIRUBINUR SMALL (A) 09/21/2010 1220   KETONESUR NEGATIVE 09/21/2010 1220   PROTEINUR 100 (A) 09/21/2010 1220   UROBILINOGEN 1.0 09/21/2010 1220   NITRITE NEGATIVE 09/21/2010 1220   LEUKOCYTESUR LARGE (A) 09/21/2010 1220   Sepsis Labs: @LABRCNTIP (procalcitonin:4,lacticidven:4) )No results found for this or any previous visit (from the past 240 hour(s)).   Radiological Exams on Admission: Dg Abd Acute W/chest  Result Date: 12/08/2016 CLINICAL DATA:  Patient has had abdominal distention for 2 months. Went to her PCP and they told her her bowels were locked up and gave her a prescription for xanax. Patient stated it helped and she started using the bathroom again. EXAM: DG ABDOMEN ACUTE W/ 1V CHEST COMPARISON:  None. FINDINGS: Small bowel dilatation with multiple air-fluid levels. Small bowel measures up to 3.3 cm. Large amount of stool in the ascending colon. No radiopaque calculi or other significant radiographic abnormality is seen. Heart size and mediastinal contours are within normal limits. Both lungs  are clear. Right-sided Port-A-Cath in satisfactory position. IMPRESSION: Small bowel dilatation with multiple air-fluid levels concerning for small bowel obstruction versus enteritis. No acute cardiopulmonary disease. Electronically Signed   By: Kathreen Devoid   On: 12/08/2016 14:23    EKG: Independently reviewed. NSR with QT of 507  Assessment/Plan Principal Problem:   Melena - black stools, apparently constantly leaking out - GI plans on endoscopy tomorrow AM - hold ASA  Active Problems:   Acute blood loss anemia -  Last Hb was 12.1 in 12/15 - will transfuse 2 U and follow every 6 hrs - obtain anemia panel - vitals stable-     Fallopian tube cancer, carcinoma     Ascites   Hypoalbuminemia  Weight loss - ? Recurrence of cancer - obtain CT in AM if Cr improved- if not, can obtain ultrasound in AM - no diuretics for now  Fever - with ascites may signify SBP- abdomen is not tender however, -  will obtain blood cultures and start Rocephin which would be given even if was not febrile (GI bleed in setting of ascites) - awaiting UA but she has been asymptomatic    Pedal edema - likely related to the same cause as the ascites - check ECHO  AKI - Cr 0.9 in 12/15 >> 1.63 now - might improve after blood is given    Hypokalemia - check Mg - replace    Thrombocytosis - ? Related to anemia or possible underlying cancer    Serum total bilirubin elevated - follow    High anion gap metabolic acidosis - check Lactic acid    Prolonged QT interval - due to hypokalemia- follow on telemetry     DVT prophylaxis: SCDs  Code Status: DNR  Family Communication: call daughter listed on face sheet  Disposition Plan: telemetry  Consults called: GI, Dr Cristina Gong called by ER  Admission status: inpatient    Joanne Odea MD [facility] Pager: [email or web address] Password [email or web address] 7PM-7AM, please contact night-coverage   12/08/2016, 4:36 PM

## 2016-12-08 NOTE — ED Provider Notes (Signed)
[facility] DEPT Provider Note   CSN: [id number] Arrival date & time: 12/08/16  1156     History   Chief Complaint No chief complaint on file.   HPI Joanne Copeland is a 76 y.o. female.  Pt presents to the ED today because she noticed black stool on Saturday, the 21st.  The pt noticed some constipation and abdominal distention with leg swelling about 2 months ago.  She thought this was due to stress as she just moved back here from [place] and she's been dealing with her husband who has dementia.  The pt said she was given a rx for Xanax which enabled her to relax enough to have a bowel movement.  When her bowels started to move again 2 days ago, she noticed they were black.  Pt is not on any blood thinners.      Past Medical History:  Diagnosis Date  . Hypercholesterolemia   . Hypertension   . Ovarian cancer ([facility])   . Pelvic mass     Patient Active Problem List   Diagnosis Date Noted  . 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 Antineoplastic chemotherapy induced anemia 06/05/2014  . Steroid-induced hyperglycemia 03/27/2014  . Fallopian tube cancer, carcinoma ([facility]) 01/05/2014    Past Surgical History:  Procedure Laterality Date  . omentectomy PPLND  01/05/2014  . radical tumor debulking  01/05/2014  . TOTAL ABDOMINAL HYSTERECTOMY W/ BILATERAL SALPINGOOPHORECTOMY  01/05/2014    OB History    No data available       Home Medications    Prior to Admission medications   Medication Sig Start Date End Date Taking? Authorizing Provider  amLODipine (NORVASC) 5 MG tablet Take 5 mg by mouth at bedtime.   Yes Historical Provider, MD  aspirin EC 325 MG tablet Take 325 mg by mouth at bedtime.   Yes Historical Provider, MD  cholecalciferol (VITAMIN D) 1000 UNITS tablet Take 1,000 Units by mouth daily.   Yes Historical Provider, MD  folic acid (FOLVITE) 1 MG tablet Take 1 mg by mouth daily.   Yes Historical Provider, MD    lactobacillus acidophilus (BACID) TABS tablet Take 1 tablet by mouth daily.   Yes Historical Provider, MD  omega-3 acid ethyl esters (LOVAZA) 1 g capsule Take 1 g by mouth daily.   Yes Historical Provider, MD  pravastatin (PRAVACHOL) 10 MG tablet Take 10 mg by mouth at bedtime.    Yes Historical Provider, MD  Prenatal Vit-Fe Fumarate-FA (PRENATAL MULTIVITAMIN) TABS tablet Take 1 tablet by mouth daily.    Yes Historical Provider, MD  sennosides-docusate sodium (SENOKOT-S) 8.6-50 MG tablet Take 1-2 tablets by mouth 2 (two) times daily as needed for constipation.    Yes Lennis Marion Downer, MD  vitamin B-12 (CYANOCOBALAMIN) 1000 MCG tablet Take 1,000 mcg by mouth daily.   Yes Historical Provider, MD  Vitamin E 100 UNITS TABS Take 100 Units by mouth daily.    Yes Historical Provider, MD    Family History Family History  Problem Relation Age of Onset  . Prostate cancer Brother   . Heart attack Father 59    Social History Social History  Substance Use Topics  . Smoking status: Never Smoker  . Smokeless tobacco: Never Used  . Alcohol use 4.2 oz/week    7 Glasses of wine per week     Comment: " Glass of wine per night & a cocktail"     Allergies   Food and Latex  Review of Systems Review of Systems  Gastrointestinal: Positive for abdominal distention.       Black stool  Musculoskeletal:       Leg swelling  All other systems reviewed and are negative.    Physical Exam Updated Vital Signs BP (!) 115/55 (BP Location: Right Arm)   Pulse 81   Temp 99.5 F (37.5 C) (Oral)   Resp 17   Ht 5\' 3"  (1.6 m)   Wt 160 lb (72.6 kg)   SpO2 95%   BMI 28.34 kg/m   Physical Exam  Constitutional: She is oriented to person, place, and time. She appears well-developed and well-nourished.  HENT:  Head: Normocephalic and atraumatic.  Right Ear: External ear normal.  Left Ear: External ear normal.  Nose: Nose normal.  Mouth/Throat: Oropharynx is clear and moist.  Eyes: Conjunctivae and  EOM are normal. Pupils are equal, round, and reactive to light.  Neck: Normal range of motion. Neck supple.  Cardiovascular: Normal rate, regular rhythm, normal heart sounds and intact distal pulses.   Pulmonary/Chest: Effort normal and breath sounds normal.  Abdominal: She exhibits distension. Bowel sounds are decreased. There is generalized tenderness.  Genitourinary: Rectal exam shows guaiac positive stool.  Musculoskeletal: She exhibits edema.  Neurological: She is alert and oriented to person, place, and time.  Skin: Skin is warm.  Psychiatric: She has a normal mood and affect. Her behavior is normal. Judgment and thought content normal.  Nursing note and vitals reviewed.    ED Treatments / Results  Labs (all labs ordered are listed, but only abnormal results are displayed) Labs Reviewed  COMPREHENSIVE METABOLIC PANEL - Abnormal; Notable for the following:       Result Value   Potassium 2.6 (*)    CO2 17 (*)    BUN 25 (*)    Creatinine, Ser 1.63 (*)    Calcium 8.0 (*)    Total Protein 5.4 (*)    Albumin 2.5 (*)    ALT 7 (*)    Total Bilirubin 1.3 (*)    GFR calc non Af Amer 30 (*)    GFR calc Af Amer 35 (*)    Anion gap 18 (*)    All other components within normal limits  CBC WITH DIFFERENTIAL/PLATELET - Abnormal; Notable for the following:    RBC 2.60 (*)    Hemoglobin 6.9 (*)    HCT 21.5 (*)    Platelets 419 (*)    Neutro Abs 7.8 (*)    All other components within normal limits  POC OCCULT BLOOD, ED - Abnormal; Notable for the following:    Fecal Occult Bld POSITIVE (*)    All other components within normal limits  AMMONIA  LIPASE, BLOOD  PROTIME-INR  URINALYSIS, ROUTINE W REFLEX MICROSCOPIC  TROPONIN I  PREPARE RBC (CROSSMATCH)    EKG  EKG Interpretation  Date/Time:  Monday December 08 2016 14:32:00 EDT Ventricular Rate:  81 PR Interval:    QRS Duration: 71 QT Interval:  436 QTC Calculation: 507 R Axis:   -6 Text Interpretation:  Sinus rhythm Low  voltage, precordial leads Nonspecific T abnormalities, diffuse leads Prolonged QT interval No old tracing to compare Confirmed by [facility] MD, Myiah Petkus [id number]) on 12/08/2016 2:39:07 PM       Radiology Dg Abd Acute W/chest  Result Date: 12/08/2016 CLINICAL DATA:  Patient has had abdominal distention for 2 months. Went to her PCP and they told her her bowels were locked up and gave her a prescription  for xanax. Patient stated it helped and she started using the bathroom again. EXAM: DG ABDOMEN ACUTE W/ 1V CHEST COMPARISON:  None. FINDINGS: Small bowel dilatation with multiple air-fluid levels. Small bowel measures up to 3.3 cm. Large amount of stool in the ascending colon. No radiopaque calculi or other significant radiographic abnormality is seen. Heart size and mediastinal contours are within normal limits. Both lungs are clear. Right-sided Port-A-Cath in satisfactory position. IMPRESSION: Small bowel dilatation with multiple air-fluid levels concerning for small bowel obstruction versus enteritis. No acute cardiopulmonary disease. Electronically Signed   By: Kathreen Devoid   On: 12/08/2016 14:23    Procedures Procedures (including critical care time)  Medications Ordered in ED Medications  0.9 %  sodium chloride infusion ( Intravenous Stopped 12/08/16 1403)  pantoprazole (PROTONIX) injection 40 mg (not administered)  0.9 %  sodium chloride infusion (not administered)  potassium chloride SA (K-DUR,KLOR-CON) CR tablet 40 mEq (not administered)     Initial Impression / Assessment and Plan / ED Course  I have reviewed the triage vital signs and the nursing notes.  Pertinent labs & imaging results that were available during my care of the patient were reviewed by me and considered in my medical decision making (see chart for details).  CRITICAL CARE Performed by: Isla Pence   Total critical care time: 30 minutes  Critical care time was exclusive of separately billable procedures and  treating other patients.  Critical care was necessary to treat or prevent imminent or life-threatening deterioration.  Critical care was time spent personally by me on the following activities: development of treatment plan with patient and/or surrogate as well as nursing, discussions with consultants, evaluation of patient's response to treatment, examination of patient, obtaining history from patient or surrogate, ordering and performing treatments and interventions, ordering and review of laboratory studies, ordering and review of radiographic studies, pulse oximetry and re-evaluation of patient's condition.    Pt had a colonoscopy about 2 years ago in [facility].  The pt said she was told it was normal.  She has no local gastroenterologist.  Pt d/w Dr. Cristina Gong [facility]) who will likely scope her tomorrow.  Pt d/w Dr. Wynelle Cleveland ([facility]) for admission.  2 units prbcs were ordered.  Potassium supplemented.   Final Clinical Impressions(s) / ED Diagnoses   Final diagnoses:  Acute GI bleeding  Anemia associated with acute blood loss  Hypokalemia    New Prescriptions New Prescriptions   No medications on file     Isla Pence, MD 12/08/16 1448

## 2016-12-08 NOTE — ED Triage Notes (Signed)
Patient has had abdominal distention for 2 months. Went to her PCP and they told her her bowels were locked up and gave her a prescription for xanax. Patient stated it helped and she started using the bathroom again. Well this week she started having diarrhea and then had a BM with bright red blood. After that her BMs have been black tarry stools for the past couple days. Abdomin still distended now running a fever.

## 2016-12-08 NOTE — ED Notes (Signed)
Bedside report given to floor RN

## 2016-12-09 ENCOUNTER — Inpatient Hospital Stay (HOSPITAL_COMMUNITY): Payer: Medicare Other | Admitting: Anesthesiology

## 2016-12-09 ENCOUNTER — Inpatient Hospital Stay (HOSPITAL_COMMUNITY): Payer: Medicare Other

## 2016-12-09 ENCOUNTER — Encounter (HOSPITAL_COMMUNITY): Payer: Self-pay | Admitting: *Deleted

## 2016-12-09 ENCOUNTER — Encounter (HOSPITAL_COMMUNITY)
Admission: EM | Disposition: A | Discharge status: Skilled Nursing Facility | Payer: Self-pay | Source: Home / Self Care | Attending: Internal Medicine

## 2016-12-09 DIAGNOSIS — R1900 Intra-abdominal and pelvic swelling, mass and lump, unspecified site: Secondary | ICD-10-CM

## 2016-12-09 DIAGNOSIS — D649 Anemia, unspecified: Secondary | ICD-10-CM

## 2016-12-09 DIAGNOSIS — C5700 Malignant neoplasm of unspecified fallopian tube: Secondary | ICD-10-CM

## 2016-12-09 DIAGNOSIS — N1330 Unspecified hydronephrosis: Secondary | ICD-10-CM

## 2016-12-09 DIAGNOSIS — K56609 Unspecified intestinal obstruction, unspecified as to partial versus complete obstruction: Secondary | ICD-10-CM

## 2016-12-09 DIAGNOSIS — E46 Unspecified protein-calorie malnutrition: Secondary | ICD-10-CM

## 2016-12-09 DIAGNOSIS — R140 Abdominal distension (gaseous): Secondary | ICD-10-CM

## 2016-12-09 DIAGNOSIS — N289 Disorder of kidney and ureter, unspecified: Secondary | ICD-10-CM

## 2016-12-09 HISTORY — PX: ESOPHAGOGASTRODUODENOSCOPY (EGD) WITH PROPOFOL: SHX5813

## 2016-12-09 LAB — CBC
HCT: 30.1 % — ABNORMAL LOW (ref 36.0–46.0)
HCT: 29.3 % — ABNORMAL LOW (ref 36.0–46.0)
HEMOGLOBIN: 9.6 g/dL — AB (ref 12.0–15.0)
Hemoglobin: 9.7 g/dL — ABNORMAL LOW (ref 12.0–15.0)
MCH: 26.9 pg (ref 26.0–34.0)
MCH: 27.2 pg (ref 26.0–34.0)
MCHC: 32.2 g/dL (ref 30.0–36.0)
MCHC: 32.8 g/dL (ref 30.0–36.0)
MCV: 83.4 fL (ref 78.0–100.0)
MCV: 83 fL (ref 78.0–100.0)
PLATELETS: 384 10*3/uL (ref 150–400)
Platelets: 370 10*3/uL (ref 150–400)
RBC: 3.61 MIL/uL — AB (ref 3.87–5.11)
RBC: 3.53 MIL/uL — AB (ref 3.87–5.11)
RDW: 15.2 % (ref 11.5–15.5)
RDW: 15.2 % (ref 11.5–15.5)
WBC: 12.5 10*3/uL — AB (ref 4.0–10.5)
WBC: 13.3 10*3/uL — ABNORMAL HIGH (ref 4.0–10.5)

## 2016-12-09 LAB — COMPREHENSIVE METABOLIC PANEL
ALBUMIN: 2.5 g/dL — AB (ref 3.5–5.0)
ALT: 8 U/L — ABNORMAL LOW (ref 14–54)
ANION GAP: 18 — AB (ref 5–15)
AST: 26 U/L (ref 15–41)
Alkaline Phosphatase: 50 U/L (ref 38–126)
BUN: 27 mg/dL — ABNORMAL HIGH (ref 6–20)
CHLORIDE: 101 mmol/L (ref 101–111)
CO2: 17 mmol/L — AB (ref 22–32)
Calcium: 8.3 mg/dL — ABNORMAL LOW (ref 8.9–10.3)
Creatinine, Ser: 1.6 mg/dL — ABNORMAL HIGH (ref 0.44–1.00)
GFR calc Af Amer: 36 mL/min — ABNORMAL LOW (ref >60)
GFR calc non Af Amer: 31 mL/min — ABNORMAL LOW (ref >60)
GLUCOSE: 102 mg/dL — AB (ref 65–99)
Potassium: 3.5 mmol/L (ref 3.5–5.1)
SODIUM: 136 mmol/L (ref 135–145)
Total Bilirubin: 1.3 mg/dL — ABNORMAL HIGH (ref 0.3–1.2)
Total Protein: 5.7 g/dL — ABNORMAL LOW (ref 6.5–8.1)

## 2016-12-09 LAB — TYPE AND SCREEN
ABO/RH(D): A NEG
Antibody Screen: NEGATIVE
Unit division: 0
Unit division: 0

## 2016-12-09 LAB — BASIC METABOLIC PANEL
ANION GAP: 19 — AB (ref 5–15)
BUN: 26 mg/dL — ABNORMAL HIGH (ref 6–20)
CALCIUM: 8.3 mg/dL — AB (ref 8.9–10.3)
CHLORIDE: 102 mmol/L (ref 101–111)
CO2: 14 mmol/L — AB (ref 22–32)
Creatinine, Ser: 1.61 mg/dL — ABNORMAL HIGH (ref 0.44–1.00)
GFR calc non Af Amer: 31 mL/min — ABNORMAL LOW (ref >60)
GFR, EST AFRICAN AMERICAN: 36 mL/min — AB (ref >60)
Glucose, Bld: 98 mg/dL (ref 65–99)
POTASSIUM: 3.2 mmol/L — AB (ref 3.5–5.1)
Sodium: 135 mmol/L (ref 135–145)

## 2016-12-09 LAB — BLOOD CULTURE ID PANEL (REFLEXED)
ACINETOBACTER BAUMANNII: NOT DETECTED
CANDIDA PARAPSILOSIS: NOT DETECTED
CANDIDA TROPICALIS: NOT DETECTED
Candida albicans: NOT DETECTED
Candida glabrata: NOT DETECTED
Candida krusei: NOT DETECTED
Enterobacter cloacae complex: NOT DETECTED
Enterobacteriaceae species: NOT DETECTED
Enterococcus species: NOT DETECTED
Escherichia coli: NOT DETECTED
HAEMOPHILUS INFLUENZAE: NOT DETECTED
KLEBSIELLA OXYTOCA: NOT DETECTED
Klebsiella pneumoniae: NOT DETECTED
Listeria monocytogenes: NOT DETECTED
METHICILLIN RESISTANCE: NOT DETECTED
NEISSERIA MENINGITIDIS: NOT DETECTED
Proteus species: NOT DETECTED
Pseudomonas aeruginosa: NOT DETECTED
SERRATIA MARCESCENS: NOT DETECTED
STAPHYLOCOCCUS AUREUS BCID: NOT DETECTED
STAPHYLOCOCCUS SPECIES: DETECTED — AB
STREPTOCOCCUS SPECIES: NOT DETECTED
Streptococcus agalactiae: NOT DETECTED
Streptococcus pneumoniae: NOT DETECTED
Streptococcus pyogenes: NOT DETECTED

## 2016-12-09 LAB — BPAM RBC
BLOOD PRODUCT EXPIRATION DATE: 201805182359
Blood Product Expiration Date: 201805182359
ISSUE DATE / TIME: 201804231809
ISSUE DATE / TIME: 201804232350
UNIT TYPE AND RH: 600
Unit Type and Rh: 600

## 2016-12-09 LAB — MAGNESIUM: Magnesium: 1.4 mg/dL — ABNORMAL LOW (ref 1.7–2.4)

## 2016-12-09 LAB — APTT: aPTT: 32 seconds (ref 24–36)

## 2016-12-09 SURGERY — ESOPHAGOGASTRODUODENOSCOPY (EGD) WITH PROPOFOL
Anesthesia: Monitor Anesthesia Care

## 2016-12-09 MED ORDER — IOPAMIDOL (ISOVUE-300) INJECTION 61%
INTRAVENOUS | Status: AC
  Filled 2016-12-09: qty 30

## 2016-12-09 MED ORDER — PHENOL 1.4 % MT LIQD
1.0000 | OROMUCOSAL | Status: DC | PRN
  Administered 2016-12-10: 1 via OROMUCOSAL
  Filled 2016-12-09: qty 177

## 2016-12-09 MED ORDER — SODIUM CHLORIDE 0.9 % IV SOLN: INTRAVENOUS | Status: DC

## 2016-12-09 MED ORDER — PROPOFOL 10 MG/ML IV BOLUS
INTRAVENOUS | Status: AC
  Filled 2016-12-09: qty 40

## 2016-12-09 MED ORDER — PROPOFOL 500 MG/50ML IV EMUL
INTRAVENOUS | Status: DC | PRN
  Administered 2016-12-09: 40 mg via INTRAVENOUS
  Administered 2016-12-09: 20 mg via INTRAVENOUS

## 2016-12-09 MED ORDER — IOPAMIDOL (ISOVUE-300) INJECTION 61%: 15.0000 mL | Freq: Two times a day (BID) | INTRAVENOUS | Status: DC | PRN

## 2016-12-09 MED ORDER — PROPOFOL 500 MG/50ML IV EMUL
INTRAVENOUS | Status: DC | PRN
  Administered 2016-12-09: 100 ug/kg/min via INTRAVENOUS

## 2016-12-09 MED ORDER — LACTATED RINGERS IV SOLN
INTRAVENOUS | Status: DC
  Administered 2016-12-09: 14:00:00 via INTRAVENOUS

## 2016-12-09 MED ORDER — MAGNESIUM SULFATE 2 GM/50ML IV SOLN
2.0000 g | Freq: Once | INTRAVENOUS | Status: AC
Start: 2016-12-09 — End: 2016-12-09
  Administered 2016-12-09: 2 g via INTRAVENOUS
  Filled 2016-12-09: qty 50

## 2016-12-09 SURGICAL SUPPLY — 15 items

## 2016-12-09 NOTE — Anesthesia Preprocedure Evaluation (Addendum)
Anesthesia Evaluation  Patient identified by MRN, date of birth, ID band Patient awake  General Assessment Comment:a 76 y.o. female with medical history of fallopian tube cancer treated 3 yrs ago, HTN and HLD who has been having black stools for 3-4 days now. She states her bleeding in "constant". Essentially she is incontinent of black stool all day long but is not having BMs at night. No abdominal pain, nausea or vomiting. She takes a full aspirin a day but no Motrin or Alleve.  She has had a great deal of weight loss in the the past month which she thought was related to anxiety  Reviewed: Allergy & Precautions, NPO status , Patient's Chart, lab work & pertinent test results  Airway Mallampati: II  TM Distance: >3 FB Neck ROM: Full    Dental no notable dental hx.    Pulmonary neg pulmonary ROS,    Pulmonary exam normal breath sounds clear to auscultation       Cardiovascular hypertension, Normal cardiovascular exam Rhythm:Regular Rate:Normal     Neuro/Psych negative neurological ROS  negative psych ROS   GI/Hepatic negative GI ROS, Neg liver ROS,   Endo/Other  negative endocrine ROS  Renal/GU Renal InsufficiencyRenal disease    Large solid pelvic mass with maximum diameter of 28.0 cm noted    Musculoskeletal negative musculoskeletal ROS (+)   Abdominal   Peds negative pediatric ROS (+)  Hematology  (+) anemia ,   Anesthesia Other Findings   Reproductive/Obstetrics negative OB ROS                             Anesthesia Physical Anesthesia Plan  ASA: IV  Anesthesia Plan: MAC   Post-op Pain Management:    Induction: Intravenous  Airway Management Planned: Nasal Cannula  Additional Equipment:   Intra-op Plan:   Post-operative Plan:   Informed Consent: I have reviewed the patients History and Physical, chart, labs and discussed the procedure including the risks, benefits and  alternatives for the proposed anesthesia with the patient or authorized representative who has indicated his/her understanding and acceptance.   Dental advisory given  Plan Discussed with: CRNA and Surgeon  Anesthesia Plan Comments:         Anesthesia Quick Evaluation

## 2016-12-09 NOTE — Interval H&P Note (Signed)
History and Physical Interval Note:  12/09/2016 2:43 PM  Joanne Copeland  has presented today for endoscopy, with the diagnosis of melena and anemia  The various methods of treatment have been discussed with the patient and family. After consideration of risks, benefits and other options for treatment, the patient has consented to  Procedure(s): ESOPHAGOGASTRODUODENOSCOPY (EGD) WITH PROPOFOL (N/A) as a surgical intervention .  The patient's history has been reviewed, patient examined, no change in status, stable for surgery.  I have reviewed the patient's chart and labs.  Questions were answered to the patient's satisfaction.     Cleotis Nipper

## 2016-12-09 NOTE — Consult Note (Signed)
Reason for Consult: 2 months of abdominal distension Referring Physician: Curly Shores  Chief complaint:  Abdominal distention  Joanne Copeland is an 76 y.o. female.  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  Best I can tell this was in [place], she was seen in the past by Dr. Marko Plume She has also undergone chemo therapy.  Back in September 2017, she was seen by Dr. 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  Incompletely visualized pulmonary nodule measuring at least 5 mm in the left lower lobe. This is new compared to prior study 09/21/2010.  She is scheduled for follow up in January and April of this year for Oncology follow up.    Yesterday she presented to the ED with 2 months of abdominal distension She went from what was described as locked up bowel to diarrhea with BRB.  Now with tarry stools for 2 days prior to admit on 12/08/16.    She was admitted by DR. Cleveland hospitalist for probable GI bleed and dark black tarry stools.  She reported weight loss and attributed it to stress of caring for her husband who has significant dementia issues./ selling their home in [place].   Work up shows she is afebrile, VSS.  Mag 1.4, K+ 3.2, creatinine is 1.6, EGFR 31.  WBC 12.5, H/H is 8.3/25.7.  3 view acute abd/chest:  Small bowel dilatation with multiple air-fluid levels. Small bowel measures up to 3.3 cm. Large amount of stool in the ascending colon. No radiopaque calculi or other significant radiographic abnormality is seen. Heart size and mediastinal contours are within normal limits. Both lungs are clear. Right-sided Port-A-Cath in satisfactory position.   NG placement  1 view abd yesterday showed:   Displacement of bowel by a large lower abdominal mass,  likely reflecting the patient's known ovarian cancer. No free intra-abdominal air seen.   Abdominal ultra sound shows:  Large solid pelvic mass with maximum diameter of 28.0 cm noted.  Bilateral moderate hydronephrosis.  3.2 cm hyperechoic lesion right upper renal pole. 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    Pt has been seen by GI and Dr. Cristina Gong.  It is his opinion she has a GI bleed with anemia, SBO, with plans for endoscopic evaluation today. Oncology, gyn-oncology, urology and our service have been ask to see and assist with her care.  CT of the chest and abdomen have been ordered and are pending.  CT are without contrast due to her elevated creatinine.    EGD 12/09/16:    - Normal larynx.                           - Normal esophagus.                           - Widely patent and mild Schatzki ring.                           - 2 cm hiatal hernia.                           -  Non-bleeding erosive gastropathy.                           - Normal examined duodenum.  Past Medical History:  Diagnosis Date  . Hypercholesterolemia   . Hypertension   . 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 No washings reported in this information. Pathologic stage pT3aN0 (stage IIIA)   . Pelvic mass     Past Surgical History:  Procedure Laterality Date  . omentectomy PPLND  01/05/2014  . radical tumor debulking  01/05/2014  . TOTAL ABDOMINAL HYSTERECTOMY W/  BILATERAL SALPINGOOPHORECTOMY  01/05/2014    Family History  Problem Relation Age of Onset  . Prostate cancer Brother   . Heart attack Father 59    Social History:  reports that she has never smoked. She has never used smokeless tobacco. She reports that she drinks about 4.2 oz of alcohol per week . She reports that she does not use drugs.   She lives and cares for her Husband who as Alzheimer's, and is [facility] now.  She has also been busy selling home in [place] and getting a place here.  She drove them from [place] 6 weeks ago.     Allergies:  Allergies  Allergen Reactions  . 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   . Latex Rash    Medications:  Prior to Admission:  Prescriptions Prior to Admission  Medication Sig Dispense Refill Last Dose  . amLODipine (NORVASC) 5 MG tablet Take 5 mg by mouth at bedtime.   12/07/2016 at Unknown time  . aspirin EC 325 MG tablet Take 325 mg by mouth at bedtime.   12/07/2016 at 2300  . cholecalciferol (VITAMIN D) 1000 UNITS tablet Take 1,000 Units by mouth daily.   12/07/2016 at Unknown time  . folic acid (FOLVITE) 1 MG tablet Take 1 mg by mouth daily.   12/07/2016 at Unknown time  . lactobacillus acidophilus (BACID) TABS tablet Take 1 tablet by mouth daily.   12/07/2016 at Unknown time  . omega-3 acid ethyl esters (LOVAZA) 1 g capsule Take 1 g by mouth daily.   12/07/2016 at Unknown time  . pravastatin (PRAVACHOL) 10 MG tablet Take 10 mg by mouth at bedtime.    12/07/2016 at Unknown time  . Prenatal Vit-Fe Fumarate-FA (PRENATAL MULTIVITAMIN) TABS tablet Take 1 tablet by mouth daily.    Past Week at Unknown time  . sennosides-docusate sodium (SENOKOT-S) 8.6-50 MG tablet Take 1-2 tablets by mouth 2 (two) times daily as needed for constipation.    Past Month at Unknown time  . vitamin B-12 (CYANOCOBALAMIN) 1000 MCG tablet Take 1,000 mcg by mouth daily.   12/07/2016 at Unknown time  . Vitamin E 100 UNITS TABS Take 100  Units by mouth daily.    Past Week at Unknown time    Results for orders placed or performed during the hospital encounter of 12/08/16 (from the past 48 hour(s))  Ammonia     Status: None   Collection Time: 12/08/16  12:53 PM  Result Value Ref Range   Ammonia 16 9 - 35 umol/L  Comprehensive metabolic panel     Status: Abnormal   Collection Time: 12/08/16 12:53 PM  Result Value Ref Range   Sodium 136 135 - 145 mmol/L   Potassium 2.6 (LL) 3.5 - 5.1 mmol/L    Comment: CRITICAL RESULT CALLED TO, READ BACK BY AND VERIFIED WITH: J.WILKERSON RN AT 1345 ON 12/08/16 BY S.VANHOORNE    Chloride 101 101 - 111 mmol/L   CO2 17 (L) 22 - 32 mmol/L   Glucose, Bld 76 65 - 99 mg/dL   BUN 25 (H) 6 - 20 mg/dL   Creatinine, Ser 1.63 (H) 0.44 - 1.00 mg/dL   Calcium 8.0 (L) 8.9 - 10.3 mg/dL   Total Protein 5.4 (L) 6.5 - 8.1 g/dL   Albumin 2.5 (L) 3.5 - 5.0 g/dL   AST 25 15 - 41 U/L   ALT 7 (L) 14 - 54 U/L   Alkaline Phosphatase 42 38 - 126 U/L   Total Bilirubin 1.3 (H) 0.3 - 1.2 mg/dL   GFR calc non Af Amer 30 (L) >60 mL/min   GFR calc Af Amer 35 (L) >60 mL/min    Comment: (NOTE) The eGFR has been calculated using the CKD EPI equation. This calculation has not been validated in all clinical situations. eGFR's persistently <60 mL/min signify possible Chronic Kidney Disease.    Anion gap 18 (H) 5 - 15  CBC WITH DIFFERENTIAL     Status: Abnormal   Collection Time: 12/08/16 12:53 PM  Result Value Ref Range   WBC 10.0 4.0 - 10.5 K/uL   RBC 2.60 (L) 3.87 - 5.11 MIL/uL   Hemoglobin 6.9 (LL) 12.0 - 15.0 g/dL    Comment: CRITICAL RESULT CALLED TO, READ BACK BY AND VERIFIED WITH: LOCKANY,R. RN '@1325'$  ON 4.23.18 BY NMCCOY    HCT 21.5 (L) 36.0 - 46.0 %   MCV 82.7 78.0 - 100.0 fL   MCH 26.5 26.0 - 34.0 pg   MCHC 32.1 30.0 - 36.0 g/dL   RDW 15.5 11.5 - 15.5 %   Platelets 419 (H) 150 - 400 K/uL   Neutrophils Relative % 78 %   Neutro Abs 7.8 (H) 1.7 - 7.7 K/uL   Lymphocytes Relative 12 %   Lymphs Abs 1.2  0.7 - 4.0 K/uL   Monocytes Relative 10 %   Monocytes Absolute 1.0 0.1 - 1.0 K/uL   Eosinophils Relative 0 %   Eosinophils Absolute 0.0 0.0 - 0.7 K/uL   Basophils Relative 0 %   Basophils Absolute 0.0 0.0 - 0.1 K/uL  Lipase, blood     Status: None   Collection Time: 12/08/16 12:53 PM  Result Value Ref Range   Lipase 17 11 - 51 U/L  Protime-INR     Status: None   Collection Time: 12/08/16 12:53 PM  Result Value Ref Range   Prothrombin Time 14.4 11.4 - 15.2 seconds   INR 1.11   Troponin I     Status: None   Collection Time: 12/08/16 12:53 PM  Result Value Ref Range   Troponin I <0.03 <0.03 ng/mL  POC occult blood, ED Provider will collect     Status: Abnormal   Collection Time: 12/08/16  1:09 PM  Result Value Ref Range   Fecal Occult Bld POSITIVE (A) NEGATIVE  Urinalysis, Routine w reflex microscopic     Status: Abnormal   Collection Time: 12/08/16  2:57 PM  Result Value Ref Range  Color, Urine YELLOW YELLOW   APPearance HAZY (A) CLEAR   Specific Gravity, Urine 1.009 1.005 - 1.030   pH 5.0 5.0 - 8.0   Glucose, UA NEGATIVE NEGATIVE mg/dL   Hgb urine dipstick LARGE (A) NEGATIVE   Bilirubin Urine NEGATIVE NEGATIVE   Ketones, ur 20 (A) NEGATIVE mg/dL   Protein, ur NEGATIVE NEGATIVE mg/dL   Nitrite NEGATIVE NEGATIVE   Leukocytes, UA LARGE (A) NEGATIVE   RBC / HPF 6-30 0 - 5 RBC/hpf   WBC, UA 6-30 0 - 5 WBC/hpf   Bacteria, UA MANY (A) NONE SEEN   Squamous Epithelial / LPF 0-5 (A) NONE SEEN   Mucous PRESENT   Type and screen [place]     Status: None   Collection Time: 12/08/16  2:57 PM  Result Value Ref Range   ABO/RH(D) A NEG    Antibody Screen NEG    Sample Expiration 12/11/2016    Unit Number [id number]    Blood Component Type RBC LR PHER1    Unit division 00    Status of Unit ISSUED,FINAL    Transfusion Status OK TO TRANSFUSE    Crossmatch Result Compatible    Unit Number [id number]    Blood Component Type RBC LR PHER2    Unit division  00    Status of Unit ISSUED,FINAL    Transfusion Status OK TO TRANSFUSE    Crossmatch Result Compatible   Prepare RBC     Status: None   Collection Time: 12/08/16  3:00 PM  Result Value Ref Range   Order Confirmation ORDER PROCESSED BY BLOOD BANK   Vitamin B12     Status: Abnormal   Collection Time: 12/08/16  3:49 PM  Result Value Ref Range   Vitamin B-12 936 (H) 180 - 914 pg/mL    Comment: (NOTE) This assay is not validated for testing neonatal or myeloproliferative syndrome specimens for Vitamin B12 levels. Performed at [facility], [place] [address] [place], [place] [address]   Folate     Status: None   Collection Time: 12/08/16  3:49 PM  Result Value Ref Range   Folate 11.5 >5.9 ng/mL    Comment: Performed at [facility], [place] [address] [place], [place] [address]  Iron and TIBC     Status: Abnormal   Collection Time: 12/08/16  3:49 PM  Result Value Ref Range   Iron 8 (L) 28 - 170 ug/dL   TIBC 148 (L) 250 - 450 ug/dL   Saturation Ratios 5 (L) 10.4 - 31.8 %   UIBC 140 ug/dL    Comment: Performed at [facility] [address], [place], [place] [address]  Ferritin     Status: Abnormal   Collection Time: 12/08/16  3:49 PM  Result Value Ref Range   Ferritin 699 (H) 11 - 307 ng/mL    Comment: Performed at [facility], [place] [address], [place], [place] [address]  Reticulocytes     Status: Abnormal   Collection Time: 12/08/16  3:49 PM  Result Value Ref Range   Retic Ct Pct 2.1 0.4 - 3.1 %   RBC. 2.65 (L) 3.87 - 5.11 MIL/uL   Retic Count, Manual 55.7 19.0 - 186.0 K/uL  Magnesium     Status: Abnormal   Collection Time: 12/08/16  3:49 PM  Result Value Ref Range   Magnesium 1.4 (L) 1.7 - 2.4 mg/dL  Lactic acid, plasma     Status: None  Collection Time: 12/08/16  3:51 PM  Result Value Ref Range   Lactic Acid, Venous 1.4 0.5 - 1.9 mmol/L  Culture, blood (Routine X 2) w Reflex to ID Panel     Status: None (Preliminary result)   Collection Time:  12/08/16  4:06 PM  Result Value Ref Range   Specimen Description LEFT ANTECUBITAL    Special Requests      BOTTLES DRAWN AEROBIC AND ANAEROBIC Blood Culture adequate volume   Culture      NO GROWTH < 24 HOURS Performed at [facility] [address] [place], [place] [address]    Report Status PENDING   Culture, blood (Routine X 2) w Reflex to ID Panel     Status: None (Preliminary result)   Collection Time: 12/08/16  4:10 PM  Result Value Ref Range   Specimen Description BLOOD RIGHT ANTECUBITAL    Special Requests      BOTTLES DRAWN AEROBIC AND ANAEROBIC Blood Culture adequate volume   Culture      NO GROWTH < 24 HOURS Performed at [facility], [place] [address], [place], [place] [address]    Report Status PENDING   MRSA PCR Screening     Status: None   Collection Time: 12/08/16  9:49 PM  Result Value Ref Range   MRSA by PCR NEGATIVE NEGATIVE    Comment:        The GeneXpert MRSA Assay (FDA approved for NASAL specimens only), is one component of a comprehensive MRSA colonization surveillance program. It is not intended to diagnose MRSA infection nor to guide or monitor treatment for MRSA infections.   CBC     Status: Abnormal   Collection Time: 12/08/16 11:30 PM  Result Value Ref Range   WBC 13.2 (H) 4.0 - 10.5 K/uL   RBC 3.12 (L) 3.87 - 5.11 MIL/uL   Hemoglobin 8.3 (L) 12.0 - 15.0 g/dL   HCT 25.7 (L) 36.0 - 46.0 %   MCV 82.4 78.0 - 100.0 fL   MCH 26.6 26.0 - 34.0 pg   MCHC 32.3 30.0 - 36.0 g/dL   RDW 15.4 11.5 - 15.5 %   Platelets 387 150 - 400 K/uL  Basic metabolic panel     Status: Abnormal   Collection Time: 12/08/16 11:30 PM  Result Value Ref Range   Sodium 135 135 - 145 mmol/L   Potassium 3.2 (L) 3.5 - 5.1 mmol/L    Comment: DELTA CHECK NOTED NO VISIBLE HEMOLYSIS    Chloride 102 101 - 111 mmol/L   CO2 14 (L) 22 - 32 mmol/L   Glucose, Bld 98 65 - 99 mg/dL   BUN 26 (H) 6 - 20 mg/dL   Creatinine, Ser 1.61 (H) 0.44 - 1.00 mg/dL   Calcium 8.3  (L) 8.9 - 10.3 mg/dL   GFR calc non Af Amer 31 (L) >60 mL/min   GFR calc Af Amer 36 (L) >60 mL/min    Comment: (NOTE) The eGFR has been calculated using the CKD EPI equation. This calculation has not been validated in all clinical situations. eGFR's persistently <60 mL/min signify possible Chronic Kidney Disease.    Anion gap 19 (H) 5 - 15  CBC     Status: Abnormal   Collection Time: 12/09/16  6:11 AM  Result Value Ref Range   WBC 12.5 (H) 4.0 - 10.5 K/uL   RBC 3.61 (L) 3.87 - 5.11 MIL/uL   Hemoglobin 9.7 (L) 12.0 - 15.0 g/dL   HCT 30.1 (  L) 36.0 - 46.0 %   MCV 83.4 78.0 - 100.0 fL   MCH 26.9 26.0 - 34.0 pg   MCHC 32.2 30.0 - 36.0 g/dL   RDW 15.2 11.5 - 15.5 %   Platelets 384 150 - 400 K/uL  Magnesium     Status: Abnormal   Collection Time: 12/09/16  6:11 AM  Result Value Ref Range   Magnesium 1.4 (L) 1.7 - 2.4 mg/dL  Comprehensive metabolic panel     Status: Abnormal   Collection Time: 12/09/16  6:11 AM  Result Value Ref Range   Sodium 136 135 - 145 mmol/L   Potassium 3.5 3.5 - 5.1 mmol/L   Chloride 101 101 - 111 mmol/L   CO2 17 (L) 22 - 32 mmol/L   Glucose, Bld 102 (H) 65 - 99 mg/dL   BUN 27 (H) 6 - 20 mg/dL   Creatinine, Ser 1.60 (H) 0.44 - 1.00 mg/dL   Calcium 8.3 (L) 8.9 - 10.3 mg/dL   Total Protein 5.7 (L) 6.5 - 8.1 g/dL   Albumin 2.5 (L) 3.5 - 5.0 g/dL   AST 26 15 - 41 U/L   ALT 8 (L) 14 - 54 U/L   Alkaline Phosphatase 50 38 - 126 U/L   Total Bilirubin 1.3 (H) 0.3 - 1.2 mg/dL   GFR calc non Af Amer 31 (L) >60 mL/min   GFR calc Af Amer 36 (L) >60 mL/min    Comment: (NOTE) The eGFR has been calculated using the CKD EPI equation. This calculation has not been validated in all clinical situations. eGFR's persistently <60 mL/min signify possible Chronic Kidney Disease.    Anion gap 18 (H) 5 - 15  APTT     Status: None   Collection Time: 12/09/16  6:11 AM  Result Value Ref Range   aPTT 32 24 - 36 seconds  CBC     Status: Abnormal   Collection Time: 12/09/16  12:08 PM  Result Value Ref Range   WBC 13.3 (H) 4.0 - 10.5 K/uL   RBC 3.53 (L) 3.87 - 5.11 MIL/uL   Hemoglobin 9.6 (L) 12.0 - 15.0 g/dL   HCT 29.3 (L) 36.0 - 46.0 %   MCV 83.0 78.0 - 100.0 fL   MCH 27.2 26.0 - 34.0 pg   MCHC 32.8 30.0 - 36.0 g/dL   RDW 15.2 11.5 - 15.5 %   Platelets 370 150 - 400 K/uL    US Abdomen Complete  Result Date: 12/09/2016 CLINICAL DATA:  Abdominal distention.  Ovarian cancer . EXAM: ABDOMEN ULTRASOUND COMPLETE COMPARISON:  [facility] 12/08/2016.  CT 08/07/2014 . FINDINGS: Gallbladder: No gallstones or wall thickening visualized. No sonographic Murphy sign noted by sonographer. Common bile duct: Diameter: 6.9 mm Liver: No focal lesion identified. Within normal limits in parenchymal echogenicity. IVC: No abnormality visualized. Pancreas: Not visualized. Spleen: Size and appearance within normal limits. Right Kidney: Length: 10.4 cm. Echogenicity within normal limits. Moderate hydronephrosis noted . 3.2 x 2.6 x 2.7 cm solid hyperechoic lesion right upper renal pole . The lesion has increased in size from 1.9 cm. 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 1.8 x 1.9 x 2.4 cm simple cyst lower pole right kidney again noted. Left Kidney: Length: 9.8 cm. Echogenicity within normal limits. Moderate hydronephrosis noted. 1.1 x 0.7 x 0.9 cm simple cyst. Abdominal aorta: No aneurysm visualized. Other findings: Large solid pelvic mass measuring 28.0 x 17.0 x 24.3 cm IMPRESSION:  1. Large solid pelvic mass with maximum diameter of 28.0 cm noted. 2. Bilateral moderate hydronephrosis. 2 3.2 cm hyperechoic lesion right upper renal pole. 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 Electronically Signed   By: Marcello Moores  Register   On: 12/09/2016 11:39   Dg Abd Acute W/chest  Result Date: 12/08/2016 CLINICAL DATA:  Patient has had abdominal distention for 2  months. Went to her PCP and they told her her bowels were locked up and gave her a prescription for xanax. Patient stated it helped and she started using the bathroom again. EXAM: DG ABDOMEN ACUTE W/ 1V CHEST COMPARISON:  None. FINDINGS: Small bowel dilatation with multiple air-fluid levels. Small bowel measures up to 3.3 cm. Large amount of stool in the ascending colon. No radiopaque calculi or other significant radiographic abnormality is seen. Heart size and mediastinal contours are within normal limits. Both lungs are clear. Right-sided Port-A-Cath in satisfactory position. IMPRESSION: Small bowel dilatation with multiple air-fluid levels concerning for small bowel obstruction versus enteritis. No acute cardiopulmonary disease. Electronically Signed   By: Kathreen Devoid   On: 12/08/2016 14:23   Dg Abd Portable 1v  Result Date: 12/08/2016 CLINICAL DATA:  Nasogastric tube placement.  Initial encounter. EXAM: PORTABLE ABDOMEN - 1 VIEW COMPARISON:  Abdominal radiograph performed earlier today at 2:07 p.m. FINDINGS: The patient's enteric tube is noted coiled at the body of the stomach, ending at the gastroesophageal junction. The stomach is relatively decompressed. There appears to be displacement of bowel by a large lower abdominal mass, likely reflecting the patient's known ovarian cancer. No free intra-abdominal air is seen. The visualized lung bases are clear. Mild degenerative change is noted at the lower lumbar spine. IMPRESSION: 1. Enteric tube noted coiled at the body of the stomach, ending at the gastroesophageal junction. The stomach is relatively decompressed. 2. Displacement of bowel by a large lower abdominal mass, likely reflecting the patient's known ovarian cancer. No free intra-abdominal air seen. Electronically Signed   By: Garald Balding M.D.   On: 12/08/2016 19:44    Review of Systems  Constitutional: Positive for fever, malaise/fatigue and weight loss.  HENT: Negative.   Eyes: Negative.    Respiratory: Negative.   Cardiovascular: Positive for leg swelling (left more than the right). Negative for chest pain, palpitations, orthopnea, claudication and PND.  Gastrointestinal: Positive for abdominal pain, blood in stool, diarrhea and nausea. Negative for vomiting.  Genitourinary: Negative.   Musculoskeletal: Negative.   Skin: Negative.   Neurological: Negative.   Endo/Heme/Allergies: Negative.   Psychiatric/Behavioral: Positive for depression. The patient is nervous/anxious.    Blood pressure (!) 148/45, pulse 87, temperature 98.4 F (36.9 C), temperature source Oral, resp. rate (!) 22, height $RemoveBe'5\' 3"'isBuOsjUq$  (1.6 m), weight 72.4 kg (159 lb 9.8 oz), SpO2 94 %. Physical Exam  Constitutional: She is oriented to person, place, and time. No distress.  Acutely and chronically ill woman, just completed and EGD by Dr. Cristina Gong.    HENT:  Head: Normocephalic and atraumatic.  Mouth/Throat: No oropharyngeal exudate.  NG in place, not connected to suction, slightly bloody fluid in NG tube.    Eyes: Right eye exhibits no discharge. Left eye exhibits no discharge. No scleral icterus.  Pupil are equal  Neck: Normal range of motion. Neck supple. No JVD present. No tracheal deviation present. No thyromegaly present.  Cardiovascular: Normal rate, regular rhythm, normal heart sounds and intact distal pulses.   No murmur heard. Respiratory: Effort  normal and breath sounds normal. No respiratory distress. She has no wheezes. She has no rales. She exhibits no tenderness.  GI: She exhibits distension and mass (Whole abdomen feels hard and distended). There is no tenderness. There is no rebound and no guarding.  Abdomen is hard and distended.  She has BS and is having loose BM's.  Not really tender to palpation. No rebound or peritonitis.    Musculoskeletal: She exhibits edema (2+ edema right leg and 3+ edema left leg). She exhibits no tenderness.  Lymphadenopathy:    She has no cervical adenopathy.   Neurological: She is alert and oriented to person, place, and time. No cranial nerve deficit.  Skin: Skin is warm and dry. No rash noted. She is not diaphoretic. No erythema. No pallor.  Psychiatric: She has a normal mood and affect. Her behavior is normal. Judgment and thought content normal.    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  Agree with urology,  oncology and Gyn-Oncology evaluations.      JENNINGS,WILLARD 12/09/2016, 2:33 PM   Agree with above. Her husband has Alzheimer's which has become worse the last 2 years.  They have moved into the [facility] about 6 weeks ago.  She has been selling their second home in [place], [place] for the last couple of months.  They have split their time between [place] and [place]. She has two daughter, Jan Fireman, who lives here and Hewitt Shorts, who lives near [place], [place].  She has had increasing abdominal distention for about 2 months, leg edema about the same time, but she feels like she has lost weight.  PE:  He abdomen is distended and feels much like a large tumor mass.  Probable massive tumor burden with limited surgical options.  She is drinking contrast for a CT scan.  Alphonsa Overall, MD, [facility] Surgery Pager: [phone] Office phone:  [phone]

## 2016-12-09 NOTE — Progress Notes (Signed)
Patient seems to have gotten partial relief from NG placement. There is dark fluid, without blood or coffee ground material, in the NG aspirate. The patient's hemoglobin has come up appropriately following transfusion. Her renal function remains mediocre despite volume expansion.  Meanwhile, her abdominal ultrasound shows a 28 cm elderly mass, without evidence of ascites.  On exam, pt remains distended, abd not visibly improved from prior to NG placement.  Abd w/out overt tenderness.  Pt is in no evident distress.  Plan:  1. Proceed with EGD as planned for later this afternoon to make sure that she does not have an active upper tract source of her recent dark stools and significant anemia.  2. I have discussed case w/ Dr. Ree Kida, and have recommended Oncology and [facility] Onc/Surgery consultations.  Cleotis Nipper, M.D. Pager [phone] If no answer or after 5 PM call [phone]

## 2016-12-09 NOTE — Progress Notes (Signed)
Patient has returned from EGD. Assess patient. Vital signs stable. Patient has no complaints. Restarted IV fluids. CT tech came to room to drop off oral contrast for CT abdomen/pelvis/chest per MD orders. NG tube left clamped at this time due to patient drinking oral contrast. Will continue to monitor.  Othella Boyer [facility] 12/09/2016 5:09 PM

## 2016-12-09 NOTE — Consult Note (Signed)
Reason for Consult: Bilateral Malignant Hydronephrosis, Acute Renal Failure, Large Pelvic Mass  Referring Physician: Dione Housekeeper DO  Joanne Copeland is an 76 y.o. female.   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   2 - Acute Renal Failure - Baseline Cr 1.1 (2015) with rise to 1.6 by labs 11/2016, bilateral severe hydro as per above.  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 [place] 11/2016 with very large pelvic mass c/w likely local recurrence. NO chest / abd axial imaging thus far.  PMH sig for GYN cancer, otherwise unremarkable. No ischemic CV disease / blood thinners. She lives at [facility] home with her husband who has severe dementia.   Today "Joanne Copeland" is seen in consultation for above.   Past Medical History:  Diagnosis Date  . Hypercholesterolemia   . Hypertension   . Ovarian cancer ([place])   . Pelvic mass     Past Surgical History:  Procedure Laterality Date  . omentectomy PPLND  01/05/2014  . radical tumor debulking  01/05/2014  . TOTAL ABDOMINAL HYSTERECTOMY W/ BILATERAL SALPINGOOPHORECTOMY  01/05/2014    Family History  Problem Relation Age of Onset  . Prostate cancer Brother   . Heart attack Father 63    Social History:  reports that she has never smoked. She has never used smokeless tobacco. She reports that she drinks about 4.2 oz of alcohol per week . She reports that she does not use drugs.  Allergies:  Allergies  Allergen Reactions  . 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   . Latex Rash    Medications: I have reviewed the patient's current medications.  Results for orders placed or performed during the hospital encounter of 12/08/16 (from the past 48 hour(s))  Ammonia     Status: None    Collection Time: 12/08/16 12:53 PM  Result Value Ref Range   Ammonia 16 9 - 35 umol/L  Comprehensive metabolic panel     Status: Abnormal   Collection Time: 12/08/16 12:53 PM  Result Value Ref Range   Sodium 136 135 - 145 mmol/L   Potassium 2.6 (LL) 3.5 - 5.1 mmol/L    Comment: CRITICAL RESULT CALLED TO, READ BACK BY AND VERIFIED WITH: J.WILKERSON RN AT 1345 ON 12/08/16 BY S.VANHOORNE    Chloride 101 101 - 111 mmol/L   CO2 17 (L) 22 - 32 mmol/L   Glucose, Bld 76 65 - 99 mg/dL   BUN 25 (H) 6 - 20 mg/dL   Creatinine, Ser 1.63 (H) 0.44 - 1.00 mg/dL   Calcium 8.0 (L) 8.9 - 10.3 mg/dL   Total Protein 5.4 (L) 6.5 - 8.1 g/dL   Albumin 2.5 (L) 3.5 - 5.0 g/dL   AST 25 15 - 41 U/L   ALT 7 (L) 14 - 54 U/L   Alkaline Phosphatase 42 38 - 126 U/L   Total Bilirubin 1.3 (H) 0.3 - 1.2 mg/dL   GFR calc non Af Amer 30 (L) >60 mL/min   GFR calc Af Amer 35 (L) >60 mL/min    Comment: (NOTE) The eGFR has been calculated using the CKD EPI equation. This calculation has not been validated in all clinical situations. eGFR's persistently <60 mL/min signify possible Chronic  Kidney Disease.    Anion gap 18 (H) 5 - 15  CBC WITH DIFFERENTIAL     Status: Abnormal   Collection Time: 12/08/16 12:53 PM  Result Value Ref Range   WBC 10.0 4.0 - 10.5 K/uL   RBC 2.60 (L) 3.87 - 5.11 MIL/uL   Hemoglobin 6.9 (LL) 12.0 - 15.0 g/dL    Comment: CRITICAL RESULT CALLED TO, READ BACK BY AND VERIFIED WITH: LOCKANY,R. RN _0  ON 4.23.18 BY NMCCOY    HCT 21.5 (L) 36.0 - 46.0 %   MCV 82.7 78.0 - 100.0 fL   MCH 26.5 26.0 - 34.0 pg   MCHC 32.1 30.0 - 36.0 g/dL   RDW 15.5 11.5 - 15.5 %   Platelets 419 (H) 150 - 400 K/uL   Neutrophils Relative % 78 %   Neutro Abs 7.8 (H) 1.7 - 7.7 K/uL   Lymphocytes Relative 12 %   Lymphs Abs 1.2 0.7 - 4.0 K/uL   Monocytes Relative 10 %   Monocytes Absolute 1.0 0.1 - 1.0 K/uL   Eosinophils Relative 0 %   Eosinophils Absolute 0.0 0.0 - 0.7 K/uL   Basophils Relative 0 %   Basophils  Absolute 0.0 0.0 - 0.1 K/uL  Lipase, blood     Status: None   Collection Time: 12/08/16 12:53 PM  Result Value Ref Range   Lipase 17 11 - 51 U/L  Protime-INR     Status: None   Collection Time: 12/08/16 12:53 PM  Result Value Ref Range   Prothrombin Time 14.4 11.4 - 15.2 seconds   INR 1.11   Troponin I     Status: None   Collection Time: 12/08/16 12:53 PM  Result Value Ref Range   Troponin I <0.03 <0.03 ng/mL  POC occult blood, ED Provider will collect     Status: Abnormal   Collection Time: 12/08/16  1:09 PM  Result Value Ref Range   Fecal Occult Bld POSITIVE (A) NEGATIVE  Urinalysis, Routine w reflex microscopic     Status: Abnormal   Collection Time: 12/08/16  2:57 PM  Result Value Ref Range   Color, Urine YELLOW YELLOW   APPearance HAZY (A) CLEAR   Specific Gravity, Urine 1.009 1.005 - 1.030   pH 5.0 5.0 - 8.0   Glucose, UA NEGATIVE NEGATIVE mg/dL   Hgb urine dipstick LARGE (A) NEGATIVE   Bilirubin Urine NEGATIVE NEGATIVE   Ketones, ur 20 (A) NEGATIVE mg/dL   Protein, ur NEGATIVE NEGATIVE mg/dL   Nitrite NEGATIVE NEGATIVE   Leukocytes, UA LARGE (A) NEGATIVE   RBC / HPF 6-30 0 - 5 RBC/hpf   WBC, UA 6-30 0 - 5 WBC/hpf   Bacteria, UA MANY (A) NONE SEEN   Squamous Epithelial / LPF 0-5 (A) NONE SEEN   Mucous PRESENT   Type and screen Miranda     Status: None   Collection Time: 12/08/16  2:57 PM  Result Value Ref Range   ABO/RH(D) A NEG    Antibody Screen NEG    Sample Expiration 12/11/2016    Unit Number [id number]    Blood Component Type RBC LR PHER1    Unit division 00    Status of Unit ISSUED,FINAL    Transfusion Status OK TO TRANSFUSE    Crossmatch Result Compatible    Unit Number [id number]    Blood Component Type RBC LR PHER2    Unit division 00    Status of Unit ISSUED,FINAL    Transfusion Status OK TO  TRANSFUSE    Crossmatch Result Compatible   Prepare RBC     Status: None   Collection Time: 12/08/16  3:00 PM  Result Value  Ref Range   Order Confirmation ORDER PROCESSED BY BLOOD BANK   Vitamin B12     Status: Abnormal   Collection Time: 12/08/16  3:49 PM  Result Value Ref Range   Vitamin B-12 936 (H) 180 - 914 pg/mL    Comment: (NOTE) This assay is not validated for testing neonatal or myeloproliferative syndrome specimens for Vitamin B12 levels. Performed at [facility], [place] [address], [place], [place] [address]   Folate     Status: None   Collection Time: 12/08/16  3:49 PM  Result Value Ref Range   Folate 11.5 >5.9 ng/mL    Comment: Performed at [facility], [place] [address] [place], [place] [address]  Iron and TIBC     Status: Abnormal   Collection Time: 12/08/16  3:49 PM  Result Value Ref Range   Iron 8 (L) 28 - 170 ug/dL   TIBC 148 (L) 250 - 450 ug/dL   Saturation Ratios 5 (L) 10.4 - 31.8 %   UIBC 140 ug/dL    Comment: Performed at [place] [facility] [address] [place], [place] [address]  Ferritin     Status: Abnormal   Collection Time: 12/08/16  3:49 PM  Result Value Ref Range   Ferritin 699 (H) 11 - 307 ng/mL    Comment: Performed at [facility], [place] [address], [place], [place] [address]  Reticulocytes     Status: Abnormal   Collection Time: 12/08/16  3:49 PM  Result Value Ref Range   Retic Ct Pct 2.1 0.4 - 3.1 %   RBC. 2.65 (L) 3.87 - 5.11 MIL/uL   Retic Count, Manual 55.7 19.0 - 186.0 K/uL  Magnesium     Status: Abnormal   Collection Time: 12/08/16  3:49 PM  Result Value Ref Range   Magnesium 1.4 (L) 1.7 - 2.4 mg/dL  Lactic acid, plasma     Status: None   Collection Time: 12/08/16  3:51 PM  Result Value Ref Range   Lactic Acid, Venous 1.4 0.5 - 1.9 mmol/L  Culture, blood (Routine X 2) w Reflex to ID Panel     Status: None (Preliminary result)   Collection Time: 12/08/16  4:06 PM  Result Value Ref Range   Specimen Description LEFT ANTECUBITAL    Special Requests      BOTTLES DRAWN AEROBIC AND ANAEROBIC Blood Culture adequate volume   Culture      NO  GROWTH < 24 HOURS Performed at [facility], [place] [address] [place], [place] [address]    Report Status PENDING   Culture, blood (Routine X 2) w Reflex to ID Panel     Status: None (Preliminary result)   Collection Time: 12/08/16  4:10 PM  Result Value Ref Range   Specimen Description BLOOD RIGHT ANTECUBITAL    Special Requests      BOTTLES DRAWN AEROBIC AND ANAEROBIC Blood Culture adequate volume   Culture      NO GROWTH < 24 HOURS Performed at [facility], [place] [address] [place], [place] [address]    Report Status PENDING   MRSA PCR Screening     Status: None   Collection Time: 12/08/16  9:49 PM  Result Value Ref Range   MRSA by PCR NEGATIVE NEGATIVE  Comment:        The GeneXpert MRSA Assay (FDA approved for NASAL specimens only), is one component of a comprehensive MRSA colonization surveillance program. It is not intended to diagnose MRSA infection nor to guide or monitor treatment for MRSA infections.   CBC     Status: Abnormal   Collection Time: 12/08/16 11:30 PM  Result Value Ref Range   WBC 13.2 (H) 4.0 - 10.5 K/uL   RBC 3.12 (L) 3.87 - 5.11 MIL/uL   Hemoglobin 8.3 (L) 12.0 - 15.0 g/dL   HCT 25.7 (L) 36.0 - 46.0 %   MCV 82.4 78.0 - 100.0 fL   MCH 26.6 26.0 - 34.0 pg   MCHC 32.3 30.0 - 36.0 g/dL   RDW 15.4 11.5 - 15.5 %   Platelets 387 150 - 400 K/uL  Basic metabolic panel     Status: Abnormal   Collection Time: 12/08/16 11:30 PM  Result Value Ref Range   Sodium 135 135 - 145 mmol/L   Potassium 3.2 (L) 3.5 - 5.1 mmol/L    Comment: DELTA CHECK NOTED NO VISIBLE HEMOLYSIS    Chloride 102 101 - 111 mmol/L   CO2 14 (L) 22 - 32 mmol/L   Glucose, Bld 98 65 - 99 mg/dL   BUN 26 (H) 6 - 20 mg/dL   Creatinine, Ser 1.61 (H) 0.44 - 1.00 mg/dL   Calcium 8.3 (L) 8.9 - 10.3 mg/dL   GFR calc non Af Amer 31 (L) >60 mL/min   GFR calc Af Amer 36 (L) >60 mL/min    Comment: (NOTE) The eGFR has been calculated using the CKD EPI equation. This calculation  has not been validated in all clinical situations. eGFR's persistently <60 mL/min signify possible Chronic Kidney Disease.    Anion gap 19 (H) 5 - 15  CBC     Status: Abnormal   Collection Time: 12/09/16  6:11 AM  Result Value Ref Range   WBC 12.5 (H) 4.0 - 10.5 K/uL   RBC 3.61 (L) 3.87 - 5.11 MIL/uL   Hemoglobin 9.7 (L) 12.0 - 15.0 g/dL   HCT 30.1 (L) 36.0 - 46.0 %   MCV 83.4 78.0 - 100.0 fL   MCH 26.9 26.0 - 34.0 pg   MCHC 32.2 30.0 - 36.0 g/dL   RDW 15.2 11.5 - 15.5 %   Platelets 384 150 - 400 K/uL  Magnesium     Status: Abnormal   Collection Time: 12/09/16  6:11 AM  Result Value Ref Range   Magnesium 1.4 (L) 1.7 - 2.4 mg/dL  Comprehensive metabolic panel     Status: Abnormal   Collection Time: 12/09/16  6:11 AM  Result Value Ref Range   Sodium 136 135 - 145 mmol/L   Potassium 3.5 3.5 - 5.1 mmol/L   Chloride 101 101 - 111 mmol/L   CO2 17 (L) 22 - 32 mmol/L   Glucose, Bld 102 (H) 65 - 99 mg/dL   BUN 27 (H) 6 - 20 mg/dL   Creatinine, Ser 1.60 (H) 0.44 - 1.00 mg/dL   Calcium 8.3 (L) 8.9 - 10.3 mg/dL   Total Protein 5.7 (L) 6.5 - 8.1 g/dL   Albumin 2.5 (L) 3.5 - 5.0 g/dL   AST 26 15 - 41 U/L   ALT 8 (L) 14 - 54 U/L   Alkaline Phosphatase 50 38 - 126 U/L   Total Bilirubin 1.3 (H) 0.3 - 1.2 mg/dL   GFR calc non Af Amer 31 (L) >60 mL/min  GFR calc Af Amer 36 (L) >60 mL/min    Comment: (NOTE) The eGFR has been calculated using the CKD EPI equation. This calculation has not been validated in all clinical situations. eGFR's persistently <60 mL/min signify possible Chronic Kidney Disease.    Anion gap 18 (H) 5 - 15  APTT     Status: None   Collection Time: 12/09/16  6:11 AM  Result Value Ref Range   aPTT 32 24 - 36 seconds  CBC     Status: Abnormal   Collection Time: 12/09/16 12:08 PM  Result Value Ref Range   WBC 13.3 (H) 4.0 - 10.5 K/uL   RBC 3.53 (L) 3.87 - 5.11 MIL/uL   Hemoglobin 9.6 (L) 12.0 - 15.0 g/dL   HCT 29.3 (L) 36.0 - 46.0 %   MCV 83.0 78.0 - 100.0 fL    MCH 27.2 26.0 - 34.0 pg   MCHC 32.8 30.0 - 36.0 g/dL   RDW 15.2 11.5 - 15.5 %   Platelets 370 150 - 400 K/uL    US Abdomen Complete  Result Date: 12/09/2016 CLINICAL DATA:  Abdominal distention.  Ovarian cancer . EXAM: ABDOMEN ULTRASOUND COMPLETE COMPARISON:  [facility] 12/08/2016.  CT 08/07/2014 . FINDINGS: Gallbladder: No gallstones or wall thickening visualized. No sonographic Murphy sign noted by sonographer. Common bile duct: Diameter: 6.9 mm Liver: No focal lesion identified. Within normal limits in parenchymal echogenicity. IVC: No abnormality visualized. Pancreas: Not visualized. Spleen: Size and appearance within normal limits. Right Kidney: Length: 10.4 cm. Echogenicity within normal limits. Moderate hydronephrosis noted . 3.2 x 2.6 x 2.7 cm solid hyperechoic lesion right upper renal pole . The lesion has increased in size from 1.9 cm. 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 1.8 x 1.9 x 2.4 cm simple cyst lower pole right kidney again noted. Left Kidney: Length: 9.8 cm. Echogenicity within normal limits. Moderate hydronephrosis noted. 1.1 x 0.7 x 0.9 cm simple cyst. Abdominal aorta: No aneurysm visualized. Other findings: Large solid pelvic mass measuring 28.0 x 17.0 x 24.3 cm IMPRESSION: 1. Large solid pelvic mass with maximum diameter of 28.0 cm noted. 2. Bilateral moderate hydronephrosis. 2 3.2 cm hyperechoic lesion right upper renal pole. 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 Electronically Signed   By: Marcello Moores  Register   On: 12/09/2016 11:39   Dg Abd Acute W/chest  Result Date: 12/08/2016 CLINICAL DATA:  Patient has had abdominal distention for 2 months. Went to her PCP and they told her her bowels were locked up and gave her a prescription for xanax. Patient stated it helped and she started using the bathroom again. EXAM: DG ABDOMEN ACUTE W/  1V CHEST COMPARISON:  None. FINDINGS: Small bowel dilatation with multiple air-fluid levels. Small bowel measures up to 3.3 cm. Large amount of stool in the ascending colon. No radiopaque calculi or other significant radiographic abnormality is seen. Heart size and mediastinal contours are within normal limits. Both lungs are clear. Right-sided Port-A-Cath in satisfactory position. IMPRESSION: Small bowel dilatation with multiple air-fluid levels concerning for small bowel obstruction versus enteritis. No acute cardiopulmonary disease. Electronically Signed   By: Kathreen Devoid   On: 12/08/2016 14:23   Dg Abd Portable 1v  Result Date: 12/08/2016 CLINICAL DATA:  Nasogastric tube placement.  Initial encounter. EXAM: PORTABLE ABDOMEN - 1 VIEW COMPARISON:  Abdominal radiograph performed earlier today at 2:07 p.m. FINDINGS: The patient's  enteric tube is noted coiled at the body of the stomach, ending at the gastroesophageal junction. The stomach is relatively decompressed. There appears to be displacement of bowel by a large lower abdominal mass, likely reflecting the patient's known ovarian cancer. No free intra-abdominal air is seen. The visualized lung bases are clear. Mild degenerative change is noted at the lower lumbar spine. IMPRESSION: 1. Enteric tube noted coiled at the body of the stomach, ending at the gastroesophageal junction. The stomach is relatively decompressed. 2. Displacement of bowel by a large lower abdominal mass, likely reflecting the patient's known ovarian cancer. No free intra-abdominal air seen. Electronically Signed   By: Garald Balding M.D.   On: 12/08/2016 19:44    Review of Systems  Constitutional: Positive for malaise/fatigue and weight loss.  HENT: Negative.   Eyes: Negative.   Respiratory: Negative.   Cardiovascular: Negative.   Gastrointestinal: Positive for abdominal pain, melena, nausea and vomiting.  Genitourinary: Negative.  Negative for flank pain and hematuria.   Musculoskeletal: Negative.   Skin: Negative.   Neurological: Negative.   Endo/Heme/Allergies: Negative.   Psychiatric/Behavioral: Negative.    Blood pressure (!) 148/45, pulse 87, temperature 98.4 F (36.9 C), temperature source Oral, resp. rate (!) 22, height _0  (1.6 m), weight 72.4 kg (159 lb 9.8 oz), SpO2 94 %. Physical Exam  Constitutional: She is oriented to person, place, and time. She appears well-developed.  HENT:  Head: Normocephalic.  NGT in place.   Eyes: Pupils are equal, round, and reactive to light.  Neck: Normal range of motion.  Cardiovascular: Normal rate.   Respiratory: Effort normal.  GI: There is no rebound and no guarding.  Distended abd with large palpable mass.   Genitourinary:  Genitourinary Comments: No CVAT.   Musculoskeletal: Normal range of motion.  Neurological: She is alert and oriented to person, place, and time.  Skin: Skin is warm.  Psychiatric: She has a normal mood and affect. Her behavior is normal. Thought content normal.    Assessment/Plan:  1 - Bilateral Malignant Hydronephrosis - likely extrinsic compression from recurent GYN cancer. Rec staging with CT chest/abd/pelvis with PO contrast to verify burden of disease before any renal decompression as if florldly metastatic, palliative course may be most appropriate. Otherwise consider bilat JJ stents v. neph tubes. NPO p MN in case we place tomorrow.   2 - Acute Renal Failure - likely from poor PO intake / pre-renal + obstruction from large pelvic mass.   3 - Large Pelvic Mass / H/o Metastatic GYN Cancer - most likely recurrent GYN cancer. Rec staging imaging as per above and prompt GYN oncology eval.   Will follow, please call me directly with questions.   Damondre Pfeifle 12/09/2016, 2:27 PM

## 2016-12-09 NOTE — H&P (View-Only) (Signed)
Referring Provider:    Dr. Reggy Eye ([facility]) Primary Care Physician:  Myriam Jacobson, MD Primary Gastroenterologist:  None (unassigned)  Reason for Consultation:  GI bleed (patient also has small bowel obstruction, possible sepsis, and concern for intra-abdominal malignancy).  HPI: Joanne Copeland is a 76 y.o. female admitted to the emergency room today because of a several day history of melenic stools, confirmed to have black, heme positive stool in the emergency room.  Her hemoglobin was very low at 6.9 with an MCV of 83; for comparison, her hemoglobin in December 2015 was 12.1. At that time, the MCV was 103.  The patient does take aspirin 325 mg daily area no prior history of ulcer disease or GI bleeding.  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  The patient indicates, that for the past several months, she has been under enormous stress with her husband having dementia, selling her house in [place], and so forth. Since that time, she has had poor appetite and significant weight loss in her upper body, but lower extremity edema. More recently, there has been abdominal distention, without pain. Today, however, she has developed bilious vomiting and colicky abdominal pain.  She was febrile on presentation to the emergency room, with a normal white count.  Her three-way abdomen series showed evidence of small bowel obstruction without perforation.  The patient has acute renal insufficiency, with estimated GFR of 30. She is also severely hypokalemic, potassium 2.6.   Past Medical History:  Diagnosis Date  . Hypercholesterolemia   . Hypertension   . Ovarian cancer ([place])   . Pelvic mass     Past Surgical History:  Procedure Laterality Date  . omentectomy PPLND  01/05/2014  . radical tumor debulking  01/05/2014  . TOTAL ABDOMINAL HYSTERECTOMY W/  BILATERAL SALPINGOOPHORECTOMY  01/05/2014    Prior to Admission medications   Medication Sig Start Date End Date Taking? Authorizing Provider  amLODipine (NORVASC) 5 MG tablet Take 5 mg by mouth at bedtime.   Yes Historical Provider, MD  aspirin EC 325 MG tablet Take 325 mg by mouth at bedtime.   Yes Historical Provider, MD  cholecalciferol (VITAMIN D) 1000 UNITS tablet Take 1,000 Units by mouth daily.   Yes Historical Provider, MD  folic acid (FOLVITE) 1 MG tablet Take 1 mg by mouth daily.   Yes Historical Provider, MD  lactobacillus acidophilus (BACID) TABS tablet Take 1 tablet by mouth daily.   Yes Historical Provider, MD  omega-3 acid ethyl esters (LOVAZA) 1 g capsule Take 1 g by mouth daily.   Yes Historical Provider, MD  pravastatin (PRAVACHOL) 10 MG tablet Take 10 mg by mouth at bedtime.    Yes Historical Provider, MD  Prenatal Vit-Fe Fumarate-FA (PRENATAL MULTIVITAMIN) TABS tablet Take 1 tablet by mouth daily.    Yes Historical Provider, MD  sennosides-docusate sodium (SENOKOT-S) 8.6-50 MG tablet Take 1-2 tablets by mouth 2 (two) times daily as needed for constipation.    Yes Lennis Marion Downer, MD  vitamin B-12 (CYANOCOBALAMIN) 1000 MCG tablet Take 1,000 mcg by mouth daily.   Yes Historical Provider, MD  Vitamin E 100 UNITS TABS Take 100 Units by mouth daily.    Yes Historical Provider, MD    Current Facility-Administered Medications  Medication Dose Route Frequency Provider Last Rate Last Dose  . 0.9 %  sodium chloride infusion   Intravenous Continuous Isla Pence, MD  Stopped at 12/08/16 1403  . 0.9 %  sodium chloride infusion  250 mL Intravenous PRN Debbe Odea, MD      . acetaminophen (TYLENOL) tablet 650 mg  650 mg Oral Q6H PRN Debbe Odea, MD       Or  . acetaminophen (TYLENOL) suppository 650 mg  650 mg Rectal Q6H PRN Debbe Odea, MD      . cefTRIAXone (ROCEPHIN) 1 g in dextrose 5 % 50 mL IVPB  1 g Intravenous Q24H Debbe Odea, MD   Stopped at 12/08/16 1726  . feeding  supplement (BOOST / RESOURCE BREEZE) liquid 1 Container  1 Container Oral TID BM Debbe Odea, MD      . ondansetron (ZOFRAN) injection 4-8 mg  4-8 mg Intravenous Q8H PRN Ronald Lobo, MD      . potassium chloride SA (K-DUR,KLOR-CON) CR tablet 40 mEq  40 mEq Oral Once Debbe Odea, MD      . sodium chloride flush (NS) 0.9 % injection 3 mL  3 mL Intravenous Q12H Saima Rizwan, MD      . sodium chloride flush (NS) 0.9 % injection 3 mL  3 mL Intravenous PRN Debbe Odea, MD        Allergies as of 12/08/2016 - Review Complete 12/08/2016  Allergen Reaction Noted  . Food Anaphylaxis and Other (See Comments) 12/08/2016  . Latex Rash 04/04/2014    Family History  Problem Relation Age of Onset  . Prostate cancer Brother   . Heart attack Father 68    Social History   Social History  . Marital status: Married    Spouse name: N/A  . Number of children: N/A  . Years of education: N/A   Occupational History  . Not on file.   Social History Main Topics  . Smoking status: Never Smoker  . Smokeless tobacco: Never Used  . Alcohol use 4.2 oz/week    7 Glasses of wine per week     Comment: " Glass of wine per night & a cocktail"  . Drug use: No  . Sexual activity: Not Currently   Other Topics Concern  . Not on file   Social History Narrative  . No narrative on file    Review of Systems: No chest pain, dyspnea, heartburn, dysphagia. See history of present illness for other details. No red blood per rectum.   Physical Exam: Vital signs in last 24 hours: Temp:  [98.5 F (36.9 C)-101.5 F (38.6 C)] 98.5 F (36.9 C) (04/23 1815) Pulse Rate:  [81-96] 88 (04/23 1815) Resp:  [16-18] 18 (04/23 1815) BP: (113-134)/(50-55) 134/53 (04/23 1815) SpO2:  [95 %-100 %] 100 % (04/23 1815) Weight:  [72.6 kg (160 lb)] 72.6 kg (160 lb) (04/23 1212) Last BM Date: 12/08/16 General:  this is a somewhat anxious, uncomfortable female who is nonetheless pleasant and well groomed. Her face and torso look  somewhat cachectic, her lower extremities have significant edema.  Head:  Normocephalic and atraumatic. Eyes:  Sclera clear, no icterus.   Conjunctiva pink. Mouth:   No ulcerations or lesions.  Oropharynx pink & moist. Neck:   No masses or thyromegaly. Lungs:  Clear throughout to auscultation.   No wheezes, crackles, or rhonchi. No evident respiratory distress. Heart:   Regular rate and rhythm; no murmurs, clicks, rubs,  or gallops. Abdomen:   distended to approximately equivalent of 6 months of pregnancy. Bowel sounds present. Some tympany present. No obvious fluid wave. No overt tenderness.  Pulses:  Normal radial pulse is  noted. Extremities: Semi-pitting edema of lower extremities, 3+ Neurologic:  Alert and coherent;  grossly normal neurologically. Skin:  No obvious rashes Cervical Nodes:  No significant cervical adenopathy. Psych:   Alert and cooperaappear slightly anxious.  Intake/Output from previous day: No intake/output data recorded. Intake/Output this shift: Total I/O In: 1050 [I.V.:1000; IV Piggyback:50] Out: -   Lab Results:  Recent Labs  12/08/16 1253  WBC 10.0  HGB 6.9*  HCT 21.5*  PLT 419*   BMET  Recent Labs  12/08/16 1253  NA 136  K 2.6*  CL 101  CO2 17*  GLUCOSE 76  BUN 25*  CREATININE 1.63*  CALCIUM 8.0*   LFT  Recent Labs  12/08/16 1253  PROT 5.4*  ALBUMIN 2.5*  AST 25  ALT 7*  ALKPHOS 42  BILITOT 1.3*   PT/INR  Recent Labs  12/08/16 1253  LABPROT 14.4  INR 1.11    Studies/Results: Dg Abd Acute W/chest  Result Date: 12/08/2016 CLINICAL DATA:  Patient has had abdominal distention for 2 months. Went to her PCP and they told her her bowels were locked up and gave her a prescription for xanax. Patient stated it helped and she started using the bathroom again. EXAM: DG ABDOMEN ACUTE W/ 1V CHEST COMPARISON:  None. FINDINGS: Small bowel dilatation with multiple air-fluid levels. Small bowel measures up to 3.3 cm. Large amount of stool  in the ascending colon. No radiopaque calculi or other significant radiographic abnormality is seen. Heart size and mediastinal contours are within normal limits. Both lungs are clear. Right-sided Port-A-Cath in satisfactory position. IMPRESSION: Small bowel dilatation with multiple air-fluid levels concerning for small bowel obstruction versus enteritis. No acute cardiopulmonary disease. Electronically Signed   By: Kathreen Devoid   On: 12/08/2016 14:23    Impression: 1. GI bleed, characterized by dark, heme positive stool of several days' duration, in a patient on aspirin 2. Severe anemia. Unclear how much of this is due to recent GI bleed, given borderline microcytic indices. 3. Bilious vomiting, radiographic evidence of small bowel obstruction 4. Abdominal distention, possibly due to bowel obstruction and/or possible intra-abdominal fluid 5. Fever without leukocytosis or overt toxicity 6. Abdominal pain, could be from [facility] or SBP or tumor infiltration of abdomen 7. Prior history of fallopian tube cancer 8. Anorexia and weight loss, possibly due to malignancy versus situational stress 9. Situational stress (husband with dementia) 66. Hypoalbuminemia, possibly contributing to lower extremity edema, nutritional versus metabolic/malignant  Plan: 1. We have tentatively scheduled the patient for endoscopic evaluation tomorrow for assessment of her GI bleed. I have discussed the nature, purpose, and risks of the procedure with the patient, who indicates she had a couple of years ago in [place]. She is agreeable to proceed. However, since her emesis is currently bilious and nonbloody in character, we can confidently assume that she is not having esophageal, gastric, or proximal duodenal bleeding. Therefore, endoscopy is not essential at present.  2. NG suction for treatment of abdominal distention, emesis, and radiographic evidence of small polyp obstruction  3. Abdominal ultrasound in the morning, with  paracentesis if fluid is confirmed to be present  4. Check CA 125 level  5. Patient will possibly need CT with IV contrast once her renal function improves    LOS: 0 days   Trine Fread V  12/08/2016, 6:33 PM   Pager [phone] If no answer or after 5 PM call [phone]

## 2016-12-09 NOTE — Progress Notes (Signed)
PROGRESS NOTE    Joanne Copeland  ACZ:[id number] DOB: 1941/06/02 DOA: 12/08/2016 PCP: Myriam Jacobson, MD   No chief complaint on file.   Brief Narrative:  76 year old female history of fallopian tube cancer treated 3 years ago, hypertension, hyperlipidemia who presented with dark stools. Patient found to have a hemoglobin of 6.9. Patient was admitted for GI bleed and possible ascites. Ultrasound was done showing a huge pelvic mass. GI was consulted for EGD. Patient also now has possible hydronephrosis, small bowel obstruction. Gynecology oncology, oncology, general surgery, urology consulted. Assessment & Plan   Pelvic Mass -With history of fallopian tube cancer 3 years ago -Patient noted to have marked distention on physical exam -Abdominal ultrasound showed large solid pelvic mass with maximum diameter of 28 cm, bilateral moderate hydronephrosis, right angiomyolipoma  -Oncology, Dr. Alvy Bimler consulted and appreciated -Have placed a call to gynonc, Dr. Laurance Flatten, awaiting callback  GI bleed/acute blood loss anemia -Hemoglobin on admission 6.9 -FOBT positive -Patient received 2 units PRBC, hemoglobin currently 9.6 -Gastroenterology consult and appreciated, plan for EGD today -Continue to monitor CBC  Small bowel obstruction with nausea/vomiting -Abdominal x-ray showed small bowel dilatation with multiple air-fluid levels concerning for small bowel obstruction versus enteritis -Currently has NG tube in place -Suspect this could be due to her pelvic mass -Gen. surgery consulted and appreciated  Acute kidney injury/bilateral hydronephrosis -Likely secondary to pelvic mass -Baseline creatinine 1, upon admission, creatinine 1.63 -Urology consulted and appreciated, recommended CT chest/abd/pelvis without contrast -Continue to monitor BMP  Hypokalemia -Replaced, continue to monitor BMP  Fever/Leukocytosis (SIRS vs Sepsis) -Likely secondary to the above vs SBP -Patient no longer  febrile however can do to have leukocytosis -Blood culture showed no growth to date -Continue ceftriaxone -Continue to monitor CBC  High anion gap metabolic acidosis -Lactic acid 1.4 -Suspect due to SBO  Thrombocytosis -Resolved  Pedal edema -Possibly related to the above -Echocardiogram pending  Prolong QT  -Likely due to hypokalemia, continue telemetry monitoring  Hypoalbuminemia/weight loss -Suspect due to pelvic mass -Currently nothing by mouth due to SBO -Nutrition to consult when patient able to tolerate by mouth  DVT Prophylaxis  Lovenox  Code Status: DNR   Family Communication: None at bedside  Disposition Plan: Admitted, pending EGD and several consults.   Consultants Gastroenterology Oncology Urology Gynecology oncology General surgery  Procedures  Abdominal ultrasound EGD  Antibiotics   Anti-infectives    Start     Dose/Rate Route Frequency Ordered Stop   12/08/16 1700  [MAR Hold]  cefTRIAXone (ROCEPHIN) 1 g in dextrose 5 % 50 mL IVPB     (MAR Hold since 12/09/16 1346)   1 g 100 mL/hr over 30 Minutes Intravenous Every 24 hours 12/08/16 1606        Subjective:   Joanne Copeland seen and examined today.  Patient feels her nausea and vomiting have improved after placement of NG tube. Does complain of abdominal fullness. Denies current chest pain or shortness of breath.  Objective:   Vitals:   12/09/16 0500 12/09/16 0540 12/09/16 1326 12/09/16 1354  BP:  127/60 (!) 132/55 (!) 148/45  Pulse:  86 86 87  Resp:  18 18 (!) 22  Temp:  98.4 F (36.9 C) 98.8 F (37.1 C) 98.4 F (36.9 C)  TempSrc:  Oral Oral Oral  SpO2:  97% 95% 94%  Weight: 72.4 kg (159 lb 9.8 oz)     Height:        Intake/Output Summary (Last 24 hours) at 12/09/16  [place] filed at 12/09/16 0600  Gross per 24 hour  Intake           2054.5 ml  Output               72 ml  Net           1982.5 ml   Filed Weights   12/08/16 1212 12/09/16 0500  Weight: 72.6 kg (160 lb)  72.4 kg (159 lb 9.8 oz)    Exam  General: Well developed, mildly cachetic, NAD  HEENT: NCAT, mucous membranes moist. NG tube placed  Neck: Supple, no JVD, no masses  Cardiovascular: S1 S2 auscultated, no rubs, murmurs or gallops. Regular rate and rhythm.  Respiratory: Clear to auscultation bilaterally with equal chest rise  Abdomen: Firm, nontender, +distended, + bowel sounds, +tympany without fluid wave  Extremities: warm dry without cyanosis clubbing. 3+ LE edema  Neuro: AAOx3, nonfocal  Psych: anxious however appropriate   Data Reviewed: I have personally reviewed following labs and imaging studies  CBC:  Recent Labs Lab 12/08/16 1253 12/08/16 2330 12/09/16 0611 12/09/16 1208  WBC 10.0 13.2* 12.5* 13.3*  NEUTROABS 7.8*  --   --   --   HGB 6.9* 8.3* 9.7* 9.6*  HCT 21.5* 25.7* 30.1* 29.3*  MCV 82.7 82.4 83.4 83.0  PLT 419* 387 384 030   Basic Metabolic Panel:  Recent Labs Lab 12/08/16 1253 12/08/16 1549 12/08/16 2330 12/09/16 0611  NA 136  --  135 136  K 2.6*  --  3.2* 3.5  CL 101  --  102 101  CO2 17*  --  14* 17*  GLUCOSE 76  --  98 102*  BUN 25*  --  26* 27*  CREATININE 1.63*  --  1.61* 1.60*  CALCIUM 8.0*  --  8.3* 8.3*  MG  --  1.4*  --  1.4*   GFR: Estimated Creatinine Clearance: 29.9 mL/min (A) (by C-G formula based on SCr of 1.6 mg/dL (H)). Liver Function Tests:  Recent Labs Lab 12/08/16 1253 12/09/16 0611  AST 25 26  ALT 7* 8*  ALKPHOS 42 50  BILITOT 1.3* 1.3*  PROT 5.4* 5.7*  ALBUMIN 2.5* 2.5*    Recent Labs Lab 12/08/16 1253  LIPASE 17    Recent Labs Lab 12/08/16 1253  AMMONIA 16   Coagulation Profile:  Recent Labs Lab 12/08/16 1253  INR 1.11   Cardiac Enzymes:  Recent Labs Lab 12/08/16 1253  TROPONINI <0.03   BNP (last 3 results) No results for input(s): PROBNP in the last 8760 hours. HbA1C: No results for input(s): HGBA1C in the last 72 hours. CBG: No results for input(s): GLUCAP in the last 168  hours. Lipid Profile: No results for input(s): CHOL, HDL, LDLCALC, TRIG, CHOLHDL, LDLDIRECT in the last 72 hours. Thyroid Function Tests: No results for input(s): TSH, T4TOTAL, FREET4, T3FREE, THYROIDAB in the last 72 hours. Anemia Panel:  Recent Labs  12/08/16 1549  VITAMINB12 936*  FOLATE 11.5  FERRITIN 699*  TIBC 148*  IRON 8*  RETICCTPCT 2.1   Urine analysis:    Component Value Date/Time   COLORURINE YELLOW 12/08/2016 1457   APPEARANCEUR HAZY (A) 12/08/2016 1457   LABSPEC 1.009 12/08/2016 1457   PHURINE 5.0 12/08/2016 1457   GLUCOSEU NEGATIVE 12/08/2016 1457   HGBUR LARGE (A) 12/08/2016 1457   BILIRUBINUR NEGATIVE 12/08/2016 1457   KETONESUR 20 (A) 12/08/2016 1457   PROTEINUR NEGATIVE 12/08/2016 1457   UROBILINOGEN 1.0 09/21/2010 1220   NITRITE  NEGATIVE 12/08/2016 1457   LEUKOCYTESUR LARGE (A) 12/08/2016 1457   Sepsis Labs: @LABRCNTIP (procalcitonin:4,lacticidven:4)  ) Recent Results (from the past 240 hour(s))  Culture, blood (Routine X 2) w Reflex to ID Panel     Status: None (Preliminary result)   Collection Time: 12/08/16  4:06 PM  Result Value Ref Range Status   Specimen Description LEFT ANTECUBITAL  Final   Special Requests   Final    BOTTLES DRAWN AEROBIC AND ANAEROBIC Blood Culture adequate volume   Culture   Final    NO GROWTH < 24 HOURS Performed at [facility], [place] [address], [place], [place] [address]    Report Status PENDING  Incomplete  Culture, blood (Routine X 2) w Reflex to ID Panel     Status: None (Preliminary result)   Collection Time: 12/08/16  4:10 PM  Result Value Ref Range Status   Specimen Description BLOOD RIGHT ANTECUBITAL  Final   Special Requests   Final    BOTTLES DRAWN AEROBIC AND ANAEROBIC Blood Culture adequate volume   Culture   Final    NO GROWTH < 24 HOURS Performed at [facility], [place] [address], [place], [place] [address]    Report Status PENDING  Incomplete  MRSA PCR Screening     Status: None    Collection Time: 12/08/16  9:49 PM  Result Value Ref Range Status   MRSA by PCR NEGATIVE NEGATIVE Final    Comment:        The GeneXpert MRSA Assay (FDA approved for NASAL specimens only), is one component of a comprehensive MRSA colonization surveillance program. It is not intended to diagnose MRSA infection nor to guide or monitor treatment for MRSA infections.       Radiology Studies: US Abdomen Complete  Result Date: 12/09/2016 CLINICAL DATA:  Abdominal distention.  Ovarian cancer . EXAM: ABDOMEN ULTRASOUND COMPLETE COMPARISON:  KUB 12/08/2016.  CT 08/07/2014 . FINDINGS: Gallbladder: No gallstones or wall thickening visualized. No sonographic Murphy sign noted by sonographer. Common bile duct: Diameter: 6.9 mm Liver: No focal lesion identified. Within normal limits in parenchymal echogenicity. IVC: No abnormality visualized. Pancreas: Not visualized. Spleen: Size and appearance within normal limits. Right Kidney: Length: 10.4 cm. Echogenicity within normal limits. Moderate hydronephrosis noted . 3.2 x 2.6 x 2.7 cm solid hyperechoic lesion right upper renal pole . The lesion has increased in size from 1.9 cm. 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 1.8 x 1.9 x 2.4 cm simple cyst lower pole right kidney again noted. Left Kidney: Length: 9.8 cm. Echogenicity within normal limits. Moderate hydronephrosis noted. 1.1 x 0.7 x 0.9 cm simple cyst. Abdominal aorta: No aneurysm visualized. Other findings: Large solid pelvic mass measuring 28.0 x 17.0 x 24.3 cm IMPRESSION: 1. Large solid pelvic mass with maximum diameter of 28.0 cm noted. 2. Bilateral moderate hydronephrosis. 2 3.2 cm hyperechoic lesion right upper renal pole. 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 Electronically Signed   By: Marcello Moores  Register   On: 12/09/2016 11:39   Dg Abd Acute  W/chest  Result Date: 12/08/2016 CLINICAL DATA:  Patient has had abdominal distention for 2 months. Went to her PCP and they told her her bowels were locked up and gave her a prescription for xanax. Patient stated it helped and she started using the bathroom again. EXAM: DG ABDOMEN ACUTE W/ 1V CHEST COMPARISON:  None. FINDINGS: Small bowel dilatation with multiple air-fluid levels. Small bowel measures up to 3.3 cm. Large amount of stool in the ascending colon. No radiopaque calculi or other significant radiographic abnormality is seen. Heart size and mediastinal contours are within normal limits. Both lungs are clear. Right-sided Port-A-Cath in satisfactory position. IMPRESSION: Small bowel dilatation with multiple air-fluid levels concerning for small bowel obstruction versus enteritis. No acute cardiopulmonary disease. Electronically Signed   By: Kathreen Devoid   On: 12/08/2016 14:23   Dg Abd Portable 1v  Result Date: 12/08/2016 CLINICAL DATA:  Nasogastric tube placement.  Initial encounter. EXAM: PORTABLE ABDOMEN - 1 VIEW COMPARISON:  Abdominal radiograph performed earlier today at 2:07 p.m. FINDINGS: The patient's enteric tube is noted coiled at the body of the stomach, ending at the gastroesophageal junction. The stomach is relatively decompressed. There appears to be displacement of bowel by a large lower abdominal mass, likely reflecting the patient's known ovarian cancer. No free intra-abdominal air is seen. The visualized lung bases are clear. Mild degenerative change is noted at the lower lumbar spine. IMPRESSION: 1. Enteric tube noted coiled at the body of the stomach, ending at the gastroesophageal junction. The stomach is relatively decompressed. 2. Displacement of bowel by a large lower abdominal mass, likely reflecting the patient's known ovarian cancer. No free intra-abdominal air seen. Electronically Signed   By: Garald Balding M.D.   On: 12/08/2016 19:44     Scheduled Meds: . [MAR Hold]  feeding supplement  1 Container Oral TID BM  . [MAR Hold] potassium chloride  40 mEq Oral Once  . [MAR Hold] sodium chloride flush  3 mL Intravenous Q12H   Continuous Infusions: . sodium chloride 125 mL/hr at 12/09/16 0302  . sodium chloride    . [MAR Hold] sodium chloride    . [MAR Hold] cefTRIAXone (ROCEPHIN)  IV Stopped (12/08/16 1726)  . lactated ringers       LOS: 1 day   Time Spent in minutes   45 minutes  Joanne Copeland D.O. on 12/09/2016 at 2:04 PM  Between 7am to 7pm - Pager - [phone]  After 7pm go to [email or web address] - password [email or web address]  And look for the night coverage person covering for me after hours  [facility] Office  [phone]

## 2016-12-09 NOTE — Progress Notes (Signed)
Upper endoscopy essentially negative. Specifically, no source of melena or anemia evident on this exam.  Ideally, the patient would have colonoscopic evaluation, but a prep is not feasible given her bowel obstruction.  In view of her pelvic mass and small bowel obstruction, it is very possible that her blood loss and anemia are coming from tumor infiltration of the bowel. Therefore, colonoscopy may not be needed, but we will be on standby in the event that you feel that it is indicated at some later time.  I will sign off at this time, but please call if we can be of further assistance in this patient's care.  Cleotis Nipper, M.D. Pager [phone] If no answer or after 5 PM call [phone]

## 2016-12-09 NOTE — Progress Notes (Signed)
Addendum to progress note just written:  Review of the patient's medications indicates that she is not currently on PPI therapy. Her history of QT prolongation is a relative contraindication to the use of such medication, and also IV famotidine.  Since the patient does not have significant lesions of the upper tract based on today's endoscopy, I will not initiate anti-peptic therapy at this time. If she was felt to need such medication, one option would be ranitidine.  Cleotis Nipper, M.D. Pager [phone] If no answer or after 5 PM call [phone]

## 2016-12-09 NOTE — Op Note (Signed)
[facility] Patient Name: Joanne Copeland Procedure Date: 12/09/2016 MRN: [id number] Attending MD: Ronald Lobo , MD Date of Birth: [birth year] CSN: [id number] Age: 76 Admit Type: Inpatient Procedure:                Upper GI endoscopy Indications:              Melena, hgb 6.9 (microcytic) in a patient on ASA                            prior to admission, but also abd distension and                            radiographic evid of SBO, w/ large pelvic mass on                            u/s (prior h/o Fallopian tube cancer) Providers:                Ronald Lobo, MD, Cleda Daub, RN, William Dalton, Technician Referring MD:              Medicines:                Monitored Anesthesia Care Complications:            No immediate complications. Estimated Blood Loss:     Estimated blood loss: none. Procedure:                Pre-Anesthesia Assessment:                           - Prior to the procedure, a History and Physical                            was performed, and patient medications and                            allergies were reviewed. The patient's tolerance of                            previous anesthesia was also reviewed. The risks                            and benefits of the procedure and the sedation                            options and risks were discussed with the patient.                            All questions were answered, and informed consent                            was obtained. Prior Anticoagulants: The patient has  taken aspirin, last dose was 3 days prior to                            procedure. ASA Grade Assessment: IV - A patient                            with severe systemic disease that is a constant                            threat to life. After reviewing the risks and                            benefits, the patient was deemed in satisfactory                            condition  to undergo the procedure.                           After obtaining informed consent, the endoscope was                            passed under direct vision. Throughout the                            procedure, the patient's blood pressure, pulse, and                            oxygen saturations were monitored continuously. The                            [id number] ([id number]) pediatric upper endoscope was                            introduced through the mouth, and advanced to the                            second part of duodenum. The upper GI endoscopy was                            accomplished without difficulty. The patient                            tolerated the procedure well. Scope In: Scope Out: Findings:      The larynx was normal.      The examined esophagus was normal.      There is no endoscopic evidence of Barrett's esophagus, esophagitis,       mass or Mallory-Weiss tear in the entire esophagus.      A widely patent and mild Schatzki ring (acquired) was found at the       gastroesophageal junction.      A 2 cm hiatal hernia was present.      A single localized, 4 mm non-bleeding erosion was found in the gastric       antrum. There were no stigmata of recent  bleeding.      The exam of the stomach was otherwise normal.      There is no endoscopic evidence of erythema or inflammatory changes       suggestive of gastritis, ulceration, fluid collections, angiodysplasia       or mass in the entire examined stomach.      The cardia and gastric fundus were normal on retroflexion.      The examined duodenum was normal.      An NG tube was present, and was somewhat coiled in the stomach. We       pulled it back to straighten out the loops, then re-advanced it at the       conclusion of the procedure so that there was no coil, but the tip was       in the body of the stomach. Impression:               - Normal larynx.                           - Normal esophagus.                            - Widely patent and mild Schatzki ring.                           - 2 cm hiatal hernia.                           - Non-bleeding erosive gastropathy.                           - Normal examined duodenum.                           - No specimens collected.                           - No evident source of melenic, heme positive stool                            or severe anemia seen on this exam. Moderate Sedation:      This patient was sedated with monitored anesthesia care, not moderate       sedation. Recommendation:           - Continue present medications.                           - Gyne Onc, Oncology, and possible General Surgery                            consultations to address the patient's large pelvic                            mass and apparent SBO.                           - Eventually, the patient might benefit from  colonoscopic evaluation to further evaluate for a                            source of anemia, but this may not be required,                            depending on the intra-operative findings.                           - NPO. Procedure Code(s):        --- Professional ---                           [id number], Esophagogastroduodenoscopy, flexible,                            transoral; diagnostic, including collection of                            specimen(s) by brushing or washing, when performed                            (separate procedure) Diagnosis Code(s):        --- Professional ---                           K31.89, Other diseases of stomach and duodenum                           K92.1, Melena (includes Hematochezia) CPT copyright 2016 [employer]. All rights reserved. The codes documented in this report are preliminary and upon coder review may  be revised to meet current compliance requirements. Ronald Lobo, MD 12/09/2016 3:17:29 PM This report has been signed electronically. Number of Addenda: 0

## 2016-12-09 NOTE — Consult Note (Signed)
Reason for Consult: Large abdominal/pelvic mass Referring Physician: Cristal Ford, DO  Joanne Copeland is an 76 y.o. female.  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 She subsequently received  6 cycles of adjuvant chemotherapy. She received 6 cycles dose dense taxane/platin as of 11//2015.  A CT scan in 12/15 did not show any findings suggestive of recurrence or metastatic disease.  Since 1/18 she endorses the onset of abdominal distension.  She has had significant stress caring for a partner with dementia and recent move from [place].  She also complains of loss of appetite, weight loss and fatigue which she attributes to stress.  She denies any early satiety or N/V prior to this hospitalization.  Several days ago she noted the onset of dark, loose stools.  Yesterday she had 2 bouts of abdominal pain with associated vomiting.  She last ate a meal 2 days ago; her last BM was yesterday.  She reports flatus today.  She was severely anemic on admission with a hemoglobin in the 6 range.  She has been transfused with an appropriate rise in the hgb/hct.  Labs were also consistent with a malnourished state and renal insufficiency.  She has been febrile, ?bacteremic with blood cultures positive for Staphylococcus--possible contaminant.  Imaging yesterday was remarkable for abdominal U/S that shows a large, 20+ abdominal/pelvic mass and bilateral hydronephrosis.  Plain films of the abdomen showed small bowel loops with air/fluid levels.    Past Medical History:  Diagnosis Date  . Hypercholesterolemia   . Hypertension   . Ovarian cancer ([place])   . Pelvic mass     Past Surgical History:  Procedure Laterality Date  . omentectomy PPLND  01/05/2014  . radical tumor debulking  01/05/2014  . TOTAL ABDOMINAL HYSTERECTOMY W/ BILATERAL SALPINGOOPHORECTOMY  01/05/2014    Family History  Problem Relation Age of Onset  . Prostate cancer Brother   .  Heart attack Father 82    Social History:  reports that she has never smoked. She has never used smokeless tobacco. She reports that she drinks about 4.2 oz of alcohol per week . She reports that she does not use drugs.  Allergies:  Allergies  Allergen Reactions  . 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   . Latex Rash    Medications: I have reviewed the patient's current medications.  Results for orders placed or performed during the hospital encounter of 12/08/16 (from the past 48 hour(s))  Ammonia     Status: None   Collection Time: 12/08/16 12:53 PM  Result Value Ref Range   Ammonia 16 9 - 35 umol/L  Comprehensive metabolic panel     Status: Abnormal   Collection Time: 12/08/16 12:53 PM  Result Value Ref Range   Sodium 136 135 - 145 mmol/L   Potassium 2.6 (LL) 3.5 - 5.1 mmol/L    Comment: CRITICAL RESULT CALLED TO, READ BACK BY AND VERIFIED WITH: J.WILKERSON RN AT 1345 ON 12/08/16 BY S.VANHOORNE    Chloride 101 101 - 111 mmol/L   CO2 17 (L) 22 - 32 mmol/L   Glucose, Bld 76 65 - 99 mg/dL   BUN 25 (H) 6 - 20 mg/dL   Creatinine, Ser 1.63 (H) 0.44 - 1.00 mg/dL   Calcium 8.0 (L) 8.9 - 10.3 mg/dL   Total Protein 5.4 (L) 6.5 - 8.1 g/dL   Albumin 2.5 (L) 3.5 - 5.0 g/dL  AST 25 15 - 41 U/L   ALT 7 (L) 14 - 54 U/L   Alkaline Phosphatase 42 38 - 126 U/L   Total Bilirubin 1.3 (H) 0.3 - 1.2 mg/dL   GFR calc non Af Amer 30 (L) >60 mL/min   GFR calc Af Amer 35 (L) >60 mL/min    Comment: (NOTE) The eGFR has been calculated using the CKD EPI equation. This calculation has not been validated in all clinical situations. eGFR's persistently <60 mL/min signify possible Chronic Kidney Disease.    Anion gap 18 (H) 5 - 15  CBC WITH DIFFERENTIAL     Status: Abnormal   Collection Time: 12/08/16 12:53 PM  Result Value Ref Range   WBC 10.0 4.0 - 10.5 K/uL   RBC 2.60 (L) 3.87 - 5.11 MIL/uL   Hemoglobin 6.9 (LL) 12.0 - 15.0 g/dL    Comment: CRITICAL  RESULT CALLED TO, READ BACK BY AND VERIFIED WITH: LOCKANY,R. RN _0  ON 4.23.18 BY NMCCOY    HCT 21.5 (L) 36.0 - 46.0 %   MCV 82.7 78.0 - 100.0 fL   MCH 26.5 26.0 - 34.0 pg   MCHC 32.1 30.0 - 36.0 g/dL   RDW 15.5 11.5 - 15.5 %   Platelets 419 (H) 150 - 400 K/uL   Neutrophils Relative % 78 %   Neutro Abs 7.8 (H) 1.7 - 7.7 K/uL   Lymphocytes Relative 12 %   Lymphs Abs 1.2 0.7 - 4.0 K/uL   Monocytes Relative 10 %   Monocytes Absolute 1.0 0.1 - 1.0 K/uL   Eosinophils Relative 0 %   Eosinophils Absolute 0.0 0.0 - 0.7 K/uL   Basophils Relative 0 %   Basophils Absolute 0.0 0.0 - 0.1 K/uL  Lipase, blood     Status: None   Collection Time: 12/08/16 12:53 PM  Result Value Ref Range   Lipase 17 11 - 51 U/L  Protime-INR     Status: None   Collection Time: 12/08/16 12:53 PM  Result Value Ref Range   Prothrombin Time 14.4 11.4 - 15.2 seconds   INR 1.11   Troponin I     Status: None   Collection Time: 12/08/16 12:53 PM  Result Value Ref Range   Troponin I <0.03 <0.03 ng/mL  POC occult blood, ED Provider will collect     Status: Abnormal   Collection Time: 12/08/16  1:09 PM  Result Value Ref Range   Fecal Occult Bld POSITIVE (A) NEGATIVE  Urinalysis, Routine w reflex microscopic     Status: Abnormal   Collection Time: 12/08/16  2:57 PM  Result Value Ref Range   Color, Urine YELLOW YELLOW   APPearance HAZY (A) CLEAR   Specific Gravity, Urine 1.009 1.005 - 1.030   pH 5.0 5.0 - 8.0   Glucose, UA NEGATIVE NEGATIVE mg/dL   Hgb urine dipstick LARGE (A) NEGATIVE   Bilirubin Urine NEGATIVE NEGATIVE   Ketones, ur 20 (A) NEGATIVE mg/dL   Protein, ur NEGATIVE NEGATIVE mg/dL   Nitrite NEGATIVE NEGATIVE   Leukocytes, UA LARGE (A) NEGATIVE   RBC / HPF 6-30 0 - 5 RBC/hpf   WBC, UA 6-30 0 - 5 WBC/hpf   Bacteria, UA MANY (A) NONE SEEN   Squamous Epithelial / LPF 0-5 (A) NONE SEEN   Mucous PRESENT   Type and screen [place]     Status: None   Collection Time: 12/08/16   2:57 PM  Result Value Ref Range   ABO/RH(D) A NEG  Antibody Screen NEG    Sample Expiration 12/11/2016    Unit Number [id number]    Blood Component Type RBC LR PHER1    Unit division 00    Status of Unit ISSUED,FINAL    Transfusion Status OK TO TRANSFUSE    Crossmatch Result Compatible    Unit Number [id number]    Blood Component Type RBC LR PHER2    Unit division 00    Status of Unit ISSUED,FINAL    Transfusion Status OK TO TRANSFUSE    Crossmatch Result Compatible   Prepare RBC     Status: None   Collection Time: 12/08/16  3:00 PM  Result Value Ref Range   Order Confirmation ORDER PROCESSED BY BLOOD BANK   Vitamin B12     Status: Abnormal   Collection Time: 12/08/16  3:49 PM  Result Value Ref Range   Vitamin B-12 936 (H) 180 - 914 pg/mL    Comment: (NOTE) This assay is not validated for testing neonatal or myeloproliferative syndrome specimens for Vitamin B12 levels. Performed at [facility], [place] [address], [place], [place] [address]   Folate     Status: None   Collection Time: 12/08/16  3:49 PM  Result Value Ref Range   Folate 11.5 >5.9 ng/mL    Comment: Performed at [facility], [place] [address] [place], [place] [address]  Iron and TIBC     Status: Abnormal   Collection Time: 12/08/16  3:49 PM  Result Value Ref Range   Iron 8 (L) 28 - 170 ug/dL   TIBC 148 (L) 250 - 450 ug/dL   Saturation Ratios 5 (L) 10.4 - 31.8 %   UIBC 140 ug/dL    Comment: Performed at [facility] [address] [place], [place] [address]  Ferritin     Status: Abnormal   Collection Time: 12/08/16  3:49 PM  Result Value Ref Range   Ferritin 699 (H) 11 - 307 ng/mL    Comment: Performed at [facility], [place] [address], [place], [place] [address]  Reticulocytes     Status: Abnormal   Collection Time: 12/08/16  3:49 PM  Result Value Ref Range   Retic Ct Pct 2.1 0.4 - 3.1 %   RBC. 2.65 (L) 3.87 - 5.11 MIL/uL   Retic Count, Manual 55.7 19.0 - 186.0 K/uL   Magnesium     Status: Abnormal   Collection Time: 12/08/16  3:49 PM  Result Value Ref Range   Magnesium 1.4 (L) 1.7 - 2.4 mg/dL  Lactic acid, plasma     Status: None   Collection Time: 12/08/16  3:51 PM  Result Value Ref Range   Lactic Acid, Venous 1.4 0.5 - 1.9 mmol/L  Culture, blood (Routine X 2) w Reflex to ID Panel     Status: None (Preliminary result)   Collection Time: 12/08/16  4:06 PM  Result Value Ref Range   Specimen Description LEFT ANTECUBITAL    Special Requests      BOTTLES DRAWN AEROBIC AND ANAEROBIC Blood Culture adequate volume   Culture  Setup Time      GRAM POSITIVE COCCI IN CLUSTERS AEROBIC BOTTLE ONLY Organism ID to follow CRITICAL RESULT CALLED TO, READ BACK BY AND VERIFIED WITH: J. Scherrie November Pharm.D. 17:10 12/09/16 ([email or web address]) Performed at [facility], [place] [address], [place], [place] [address]    Culture GRAM POSITIVE COCCI    Report Status PENDING   Blood Culture ID Panel (Reflexed)  Status: Abnormal   Collection Time: 12/08/16  4:06 PM  Result Value Ref Range   Enterococcus species NOT DETECTED NOT DETECTED   Listeria monocytogenes NOT DETECTED NOT DETECTED   Staphylococcus species DETECTED (A) NOT DETECTED    Comment: Methicillin (oxacillin) susceptible coagulase negative staphylococcus. Possible blood culture contaminant (unless isolated from more than one blood culture draw or clinical case suggests pathogenicity). No antibiotic treatment is indicated for blood  culture contaminants. CRITICAL RESULT CALLED TO, READ BACK BY AND VERIFIED WITH: J. Scherrie November Pharm.D. 17:10 12/09/16 ([email or web address])    Staphylococcus aureus NOT DETECTED NOT DETECTED   Methicillin resistance NOT DETECTED NOT DETECTED   Streptococcus species NOT DETECTED NOT DETECTED   Streptococcus agalactiae NOT DETECTED NOT DETECTED   Streptococcus pneumoniae NOT DETECTED NOT DETECTED   Streptococcus pyogenes NOT DETECTED NOT DETECTED   Acinetobacter baumannii NOT DETECTED NOT DETECTED    Enterobacteriaceae species NOT DETECTED NOT DETECTED   Enterobacter cloacae complex NOT DETECTED NOT DETECTED   Escherichia coli NOT DETECTED NOT DETECTED   Klebsiella oxytoca NOT DETECTED NOT DETECTED   Klebsiella pneumoniae NOT DETECTED NOT DETECTED   Proteus species NOT DETECTED NOT DETECTED   Serratia marcescens NOT DETECTED NOT DETECTED   Haemophilus influenzae NOT DETECTED NOT DETECTED   Neisseria meningitidis NOT DETECTED NOT DETECTED   Pseudomonas aeruginosa NOT DETECTED NOT DETECTED   Candida albicans NOT DETECTED NOT DETECTED   Candida glabrata NOT DETECTED NOT DETECTED   Candida krusei NOT DETECTED NOT DETECTED   Candida parapsilosis NOT DETECTED NOT DETECTED   Candida tropicalis NOT DETECTED NOT DETECTED    Comment: Performed at [facility] [address] [place], [place] [address]  Culture, blood (Routine X 2) w Reflex to ID Panel     Status: None (Preliminary result)   Collection Time: 12/08/16  4:10 PM  Result Value Ref Range   Specimen Description BLOOD RIGHT ANTECUBITAL    Special Requests      BOTTLES DRAWN AEROBIC AND ANAEROBIC Blood Culture adequate volume   Culture      NO GROWTH < 24 HOURS Performed at [facility], [place] [address] [place], [place] [address]    Report Status PENDING   MRSA PCR Screening     Status: None   Collection Time: 12/08/16  9:49 PM  Result Value Ref Range   MRSA by PCR NEGATIVE NEGATIVE    Comment:        The GeneXpert MRSA Assay (FDA approved for NASAL specimens only), is one component of a comprehensive MRSA colonization surveillance program. It is not intended to diagnose MRSA infection nor to guide or monitor treatment for MRSA infections.   CBC     Status: Abnormal   Collection Time: 12/08/16 11:30 PM  Result Value Ref Range   WBC 13.2 (H) 4.0 - 10.5 K/uL   RBC 3.12 (L) 3.87 - 5.11 MIL/uL   Hemoglobin 8.3 (L) 12.0 - 15.0 g/dL   HCT 25.7 (L) 36.0 - 46.0 %   MCV 82.4 78.0 - 100.0 fL   MCH 26.6 26.0 -  34.0 pg   MCHC 32.3 30.0 - 36.0 g/dL   RDW 15.4 11.5 - 15.5 %   Platelets 387 150 - 400 K/uL  Basic metabolic panel     Status: Abnormal   Collection Time: 12/08/16 11:30 PM  Result Value Ref Range   Sodium 135 135 - 145 mmol/L   Potassium 3.2 (L) 3.5 - 5.1 mmol/L    Comment: DELTA  CHECK NOTED NO VISIBLE HEMOLYSIS    Chloride 102 101 - 111 mmol/L   CO2 14 (L) 22 - 32 mmol/L   Glucose, Bld 98 65 - 99 mg/dL   BUN 26 (H) 6 - 20 mg/dL   Creatinine, Ser 1.61 (H) 0.44 - 1.00 mg/dL   Calcium 8.3 (L) 8.9 - 10.3 mg/dL   GFR calc non Af Amer 31 (L) >60 mL/min   GFR calc Af Amer 36 (L) >60 mL/min    Comment: (NOTE) The eGFR has been calculated using the CKD EPI equation. This calculation has not been validated in all clinical situations. eGFR's persistently <60 mL/min signify possible Chronic Kidney Disease.    Anion gap 19 (H) 5 - 15  CBC     Status: Abnormal   Collection Time: 12/09/16  6:11 AM  Result Value Ref Range   WBC 12.5 (H) 4.0 - 10.5 K/uL   RBC 3.61 (L) 3.87 - 5.11 MIL/uL   Hemoglobin 9.7 (L) 12.0 - 15.0 g/dL   HCT 30.1 (L) 36.0 - 46.0 %   MCV 83.4 78.0 - 100.0 fL   MCH 26.9 26.0 - 34.0 pg   MCHC 32.2 30.0 - 36.0 g/dL   RDW 15.2 11.5 - 15.5 %   Platelets 384 150 - 400 K/uL  Magnesium     Status: Abnormal   Collection Time: 12/09/16  6:11 AM  Result Value Ref Range   Magnesium 1.4 (L) 1.7 - 2.4 mg/dL  Comprehensive metabolic panel     Status: Abnormal   Collection Time: 12/09/16  6:11 AM  Result Value Ref Range   Sodium 136 135 - 145 mmol/L   Potassium 3.5 3.5 - 5.1 mmol/L   Chloride 101 101 - 111 mmol/L   CO2 17 (L) 22 - 32 mmol/L   Glucose, Bld 102 (H) 65 - 99 mg/dL   BUN 27 (H) 6 - 20 mg/dL   Creatinine, Ser 1.60 (H) 0.44 - 1.00 mg/dL   Calcium 8.3 (L) 8.9 - 10.3 mg/dL   Total Protein 5.7 (L) 6.5 - 8.1 g/dL   Albumin 2.5 (L) 3.5 - 5.0 g/dL   AST 26 15 - 41 U/L   ALT 8 (L) 14 - 54 U/L   Alkaline Phosphatase 50 38 - 126 U/L   Total Bilirubin 1.3 (H) 0.3 -  1.2 mg/dL   GFR calc non Af Amer 31 (L) >60 mL/min   GFR calc Af Amer 36 (L) >60 mL/min    Comment: (NOTE) The eGFR has been calculated using the CKD EPI equation. This calculation has not been validated in all clinical situations. eGFR's persistently <60 mL/min signify possible Chronic Kidney Disease.    Anion gap 18 (H) 5 - 15  APTT     Status: None   Collection Time: 12/09/16  6:11 AM  Result Value Ref Range   aPTT 32 24 - 36 seconds  CBC     Status: Abnormal   Collection Time: 12/09/16 12:08 PM  Result Value Ref Range   WBC 13.3 (H) 4.0 - 10.5 K/uL   RBC 3.53 (L) 3.87 - 5.11 MIL/uL   Hemoglobin 9.6 (L) 12.0 - 15.0 g/dL   HCT 29.3 (L) 36.0 - 46.0 %   MCV 83.0 78.0 - 100.0 fL   MCH 27.2 26.0 - 34.0 pg   MCHC 32.8 30.0 - 36.0 g/dL   RDW 15.2 11.5 - 15.5 %   Platelets 370 150 - 400 K/uL    US Abdomen Complete  Result  Date: 12/09/2016 CLINICAL DATA:  Abdominal distention.  Ovarian cancer . EXAM: ABDOMEN ULTRASOUND COMPLETE COMPARISON:  KUB 12/08/2016.  CT 08/07/2014 . FINDINGS: Gallbladder: No gallstones or wall thickening visualized. No sonographic Murphy sign noted by sonographer. Common bile duct: Diameter: 6.9 mm Liver: No focal lesion identified. Within normal limits in parenchymal echogenicity. IVC: No abnormality visualized. Pancreas: Not visualized. Spleen: Size and appearance within normal limits. Right Kidney: Length: 10.4 cm. Echogenicity within normal limits. Moderate hydronephrosis noted . 3.2 x 2.6 x 2.7 cm solid hyperechoic lesion right upper renal pole . The lesion has increased in size from 1.9 cm. 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 1.8 x 1.9 x 2.4 cm simple cyst lower pole right kidney again noted. Left Kidney: Length: 9.8 cm. Echogenicity within normal limits. Moderate hydronephrosis noted. 1.1 x 0.7 x 0.9 cm simple cyst. Abdominal aorta: No aneurysm visualized. Other  findings: Large solid pelvic mass measuring 28.0 x 17.0 x 24.3 cm IMPRESSION: 1. Large solid pelvic mass with maximum diameter of 28.0 cm noted. 2. Bilateral moderate hydronephrosis. 2 3.2 cm hyperechoic lesion right upper renal pole. 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 Electronically Signed   By: Marcello Moores  Register   On: 12/09/2016 11:39   Dg Abd Acute W/chest  Result Date: 12/08/2016 CLINICAL DATA:  Patient has had abdominal distention for 2 months. Went to her PCP and they told her her bowels were locked up and gave her a prescription for xanax. Patient stated it helped and she started using the bathroom again. EXAM: DG ABDOMEN ACUTE W/ 1V CHEST COMPARISON:  None. FINDINGS: Small bowel dilatation with multiple air-fluid levels. Small bowel measures up to 3.3 cm. Large amount of stool in the ascending colon. No radiopaque calculi or other significant radiographic abnormality is seen. Heart size and mediastinal contours are within normal limits. Both lungs are clear. Right-sided Port-A-Cath in satisfactory position. IMPRESSION: Small bowel dilatation with multiple air-fluid levels concerning for small bowel obstruction versus enteritis. No acute cardiopulmonary disease. Electronically Signed   By: Kathreen Devoid   On: 12/08/2016 14:23   Dg Abd Portable 1v  Result Date: 12/08/2016 CLINICAL DATA:  Nasogastric tube placement.  Initial encounter. EXAM: PORTABLE ABDOMEN - 1 VIEW COMPARISON:  Abdominal radiograph performed earlier today at 2:07 p.m. FINDINGS: The patient's enteric tube is noted coiled at the body of the stomach, ending at the gastroesophageal junction. The stomach is relatively decompressed. There appears to be displacement of bowel by a large lower abdominal mass, likely reflecting the patient's known ovarian cancer. No free intra-abdominal air is seen. The visualized lung bases are clear. Mild degenerative change is noted at the lower  lumbar spine. IMPRESSION: 1. Enteric tube noted coiled at the body of the stomach, ending at the gastroesophageal junction. The stomach is relatively decompressed. 2. Displacement of bowel by a large lower abdominal mass, likely reflecting the patient's known ovarian cancer. No free intra-abdominal air seen. Electronically Signed   By: Garald Balding M.D.   On: 12/08/2016 19:44    Review of Systems  Constitutional: Positive for fever, malaise/fatigue and weight loss.  Cardiovascular: Positive for leg swelling.  Gastrointestinal: Positive for abdominal pain, diarrhea, melena and vomiting.   Blood pressure (!) 125/49, pulse 87, temperature 98.3 F (36.8 C), temperature source Oral, resp. rate (!) 27, height _0  (1.6 m), weight 159 lb 9.8 oz (72.4 kg), SpO2  94 %. Physical Exam  Constitutional: She is oriented to person, place, and time.  Thin, non-toxic appearing, no distress  HENT:  Head: Normocephalic.  Cardiovascular: Normal rate.   Respiratory: Breath sounds normal.  GI: She exhibits distension. Mass: no discrete mass. There is no tenderness. There is no rebound and no guarding.  Musculoskeletal: She exhibits edema.  Neurological: She is alert and oriented to person, place, and time.    Assessment/Plan: 76 yo with constellation of symptoms/findings worrisome for a recurrent fallopian tube malignancy with possible bowel involvement.  Associated SBO, anemia, malnutrition, hydronephrosis, and renal insufficiency.  >check the results of the CT scan of the abdomen/pelvis >consider consult with IR for biopsy of the mass >continue bowel rest, NGT, supportive care >possibly a candidate for an extirpating procedure  JACKSON-MOORE,Nashea Chumney A 12/09/2016, 6:13 PM

## 2016-12-09 NOTE — Progress Notes (Signed)
Assumed care of patient at this time. Patient is off the floor for EGD. Received report from [place], Therapist, sports. Will assess patient upon return from EGD.

## 2016-12-09 NOTE — Progress Notes (Signed)
Patient ID: Joanne Copeland, female   DOB: Dec 30, 1940, 76 y.o.   MRN: [id number] Pt presented to [place] dept today for possible paracentesis. On limited US abd in all four quadrants there is no significant ascites present. Paracentesis not performed. Pt informed. See report of complete abd [place] for additional findings.

## 2016-12-09 NOTE — Progress Notes (Signed)
PHARMACY - PHYSICIAN COMMUNICATION CRITICAL VALUE ALERT - BLOOD CULTURE IDENTIFICATION (BCID)  Results for orders placed or performed during the hospital encounter of 12/08/16  Blood Culture ID Panel (Reflexed) (Collected: 12/08/2016  4:06 PM)  Result Value Ref Range   Enterococcus species NOT DETECTED NOT DETECTED   Listeria monocytogenes NOT DETECTED NOT DETECTED   Staphylococcus species DETECTED (A) NOT DETECTED   Staphylococcus aureus NOT DETECTED NOT DETECTED   Methicillin resistance NOT DETECTED NOT DETECTED   Streptococcus species NOT DETECTED NOT DETECTED   Streptococcus agalactiae NOT DETECTED NOT DETECTED   Streptococcus pneumoniae NOT DETECTED NOT DETECTED   Streptococcus pyogenes NOT DETECTED NOT DETECTED   Acinetobacter baumannii NOT DETECTED NOT DETECTED   Enterobacteriaceae species NOT DETECTED NOT DETECTED   Enterobacter cloacae complex NOT DETECTED NOT DETECTED   Escherichia coli NOT DETECTED NOT DETECTED   Klebsiella oxytoca NOT DETECTED NOT DETECTED   Klebsiella pneumoniae NOT DETECTED NOT DETECTED   Proteus species NOT DETECTED NOT DETECTED   Serratia marcescens NOT DETECTED NOT DETECTED   Haemophilus influenzae NOT DETECTED NOT DETECTED   Neisseria meningitidis NOT DETECTED NOT DETECTED   Pseudomonas aeruginosa NOT DETECTED NOT DETECTED   Candida albicans NOT DETECTED NOT DETECTED   Candida glabrata NOT DETECTED NOT DETECTED   Candida krusei NOT DETECTED NOT DETECTED   Candida parapsilosis NOT DETECTED NOT DETECTED   Candida tropicalis NOT DETECTED NOT DETECTED    Name of physician (or Provider) Contacted: Dr Ree Kida  Changes to prescribed antibiotics required: no changes at this time  Everette Rank, PharmD 12/09/2016  5:27 PM

## 2016-12-09 NOTE — Transfer of Care (Signed)
Immediate Anesthesia Transfer of Care Note  Patient: Joanne Copeland  Procedure(s) Performed: Procedure(s): ESOPHAGOGASTRODUODENOSCOPY (EGD) WITH PROPOFOL (N/A)  Patient Location: PACU  Anesthesia Type:MAC  Level of Consciousness: awake, alert  and oriented  Airway & Oxygen Therapy: Patient Spontanous Breathing and Patient connected to nasal cannula oxygen  Post-op Assessment: Report given to RN and Post -op Vital signs reviewed and stable  Post vital signs: Reviewed and stable  Last Vitals:  Vitals:   12/09/16 1326 12/09/16 1354  BP: (!) 132/55 (!) 148/45  Pulse: 86 87  Resp: 18 (!) 22  Temp: 37.1 C 36.9 C    Last Pain:  Vitals:   12/09/16 1354  TempSrc: Oral  PainSc:       Patients Stated Pain Goal: 3 (23/30/07 6226)  Complications: No apparent anesthesia complications

## 2016-12-10 ENCOUNTER — Other Ambulatory Visit (HOSPITAL_COMMUNITY): Payer: Medicare Other

## 2016-12-10 ENCOUNTER — Encounter (HOSPITAL_COMMUNITY): Payer: Self-pay | Admitting: Physician Assistant

## 2016-12-10 ENCOUNTER — Inpatient Hospital Stay (HOSPITAL_COMMUNITY): Payer: Medicare Other

## 2016-12-10 DIAGNOSIS — Z515 Encounter for palliative care: Secondary | ICD-10-CM

## 2016-12-10 DIAGNOSIS — D500 Iron deficiency anemia secondary to blood loss (chronic): Secondary | ICD-10-CM

## 2016-12-10 DIAGNOSIS — E440 Moderate protein-calorie malnutrition: Secondary | ICD-10-CM

## 2016-12-10 DIAGNOSIS — K922 Gastrointestinal hemorrhage, unspecified: Secondary | ICD-10-CM

## 2016-12-10 DIAGNOSIS — R109 Unspecified abdominal pain: Secondary | ICD-10-CM

## 2016-12-10 LAB — CBC
HCT: 27.8 % — ABNORMAL LOW (ref 36.0–46.0)
Hemoglobin: 9.1 g/dL — ABNORMAL LOW (ref 12.0–15.0)
MCH: 27.2 pg (ref 26.0–34.0)
MCHC: 32.7 g/dL (ref 30.0–36.0)
MCV: 83.2 fL (ref 78.0–100.0)
PLATELETS: 370 10*3/uL (ref 150–400)
RBC: 3.34 MIL/uL — ABNORMAL LOW (ref 3.87–5.11)
RDW: 15.7 % — AB (ref 11.5–15.5)
WBC: 12.6 10*3/uL — ABNORMAL HIGH (ref 4.0–10.5)

## 2016-12-10 LAB — COMPREHENSIVE METABOLIC PANEL
ALBUMIN: 2.2 g/dL — AB (ref 3.5–5.0)
ALT: 8 U/L — ABNORMAL LOW (ref 14–54)
AST: 25 U/L (ref 15–41)
Alkaline Phosphatase: 53 U/L (ref 38–126)
Anion gap: 15 (ref 5–15)
BUN: 26 mg/dL — AB (ref 6–20)
CALCIUM: 8.1 mg/dL — AB (ref 8.9–10.3)
CHLORIDE: 105 mmol/L (ref 101–111)
CO2: 18 mmol/L — ABNORMAL LOW (ref 22–32)
CREATININE: 1.5 mg/dL — AB (ref 0.44–1.00)
GFR calc non Af Amer: 33 mL/min — ABNORMAL LOW (ref >60)
GFR, EST AFRICAN AMERICAN: 39 mL/min — AB (ref >60)
Glucose, Bld: 92 mg/dL (ref 65–99)
Potassium: 3.7 mmol/L (ref 3.5–5.1)
SODIUM: 138 mmol/L (ref 135–145)
Total Bilirubin: 1.1 mg/dL (ref 0.3–1.2)
Total Protein: 5.4 g/dL — ABNORMAL LOW (ref 6.5–8.1)

## 2016-12-10 LAB — CA 125: CA 125: 316.7 U/mL — AB (ref 0.0–38.1)

## 2016-12-10 LAB — CULTURE, BLOOD (ROUTINE X 2): Special Requests: ADEQUATE

## 2016-12-10 MED ORDER — FENTANYL CITRATE (PF) 100 MCG/2ML IJ SOLN
INTRAMUSCULAR | Status: AC
  Filled 2016-12-10: qty 4

## 2016-12-10 MED ORDER — MIDAZOLAM HCL 2 MG/2ML IJ SOLN
INTRAMUSCULAR | Status: AC
  Filled 2016-12-10: qty 6

## 2016-12-10 MED ORDER — FENTANYL CITRATE (PF) 100 MCG/2ML IJ SOLN
INTRAMUSCULAR | Status: AC | PRN
  Administered 2016-12-10: 50 ug via INTRAVENOUS

## 2016-12-10 MED ORDER — MIDAZOLAM HCL 2 MG/2ML IJ SOLN
INTRAMUSCULAR | Status: AC | PRN
  Administered 2016-12-10: 1 mg via INTRAVENOUS
  Administered 2016-12-10: 0.5 mg via INTRAVENOUS

## 2016-12-10 MED ORDER — LIDOCAINE HCL 2 % EX GEL
1.0000 "application " | Freq: Three times a day (TID) | CUTANEOUS | Status: DC | PRN
  Administered 2016-12-10: 1
  Filled 2016-12-10 (×3): qty 5

## 2016-12-10 NOTE — Progress Notes (Addendum)
Patient ID: Joanne Copeland, female   DOB: 1940-09-03, 76 y.o.   MRN: [id number]  [facility] Surgery Progress Note  1 Day Post-Op  Subjective: CC- abdominal distension, abdominal mass Slept OK last night. Patient reports persistent global abdominal pain and distension. She is passing flatus and having loose stools. Denies n/v.  EGD yesterday. Patient also had abdominal CT yesterday.  Objective: Vital signs in last 24 hours: Temp:  [98.1 F (36.7 C)-98.8 F (37.1 C)] 98.6 F (37 C) (04/25 0449) Pulse Rate:  [86-89] 87 (04/25 0449) Resp:  [18-27] 20 (04/25 0449) BP: (103-148)/(30-56) 114/50 (04/25 0449) SpO2:  [91 %-96 %] 94 % (04/25 0449) Weight:  [167 lb 15.9 oz (76.2 kg)] 167 lb 15.9 oz (76.2 kg) (04/25 0500) Last BM Date: 12/09/16  Intake/Output from previous day: 04/24 0701 - 04/25 0700 In: 3011.9 [P.O.:200; I.V.:2761.9; IV Piggyback:50] Out: 228 [Urine:2; Emesis/NG output:225; Stool:1] Intake/Output this shift: No intake/output data recorded.  PE: Gen:  Alert, chronically ill appearing, NAD but appears uncomfortable HEENT: pupils equal and round; NG tube in place Card:  RRR, no M/G/R heard Pulm:  CTAB, no W/R/R, effort normal Abd: well healed midline incision; firm and distended, +BS, no HSM, no hernia; nontender with no rebound or guarding  Lab Results:   Recent Labs  12/09/16 1208 12/10/16 0530  WBC 13.3* 12.6*  HGB 9.6* 9.1*  HCT 29.3* 27.8*  PLT 370 370   BMET  Recent Labs  12/09/16 0611 12/10/16 0530  NA 136 138  K 3.5 3.7  CL 101 105  CO2 17* 18*  GLUCOSE 102* 92  BUN 27* 26*  CREATININE 1.60* 1.50*  CALCIUM 8.3* 8.1*   PT/INR  Recent Labs  12/08/16 1253  LABPROT 14.4  INR 1.11   CMP     Component Value Date/Time   NA 138 12/10/2016 0530   NA 141 08/07/2014 0858   K 3.7 12/10/2016 0530   K 4.0 08/07/2014 0858   CL 105 12/10/2016 0530   CO2 18 (L) 12/10/2016 0530   CO2 25 08/07/2014 0858   GLUCOSE 92 12/10/2016 0530    GLUCOSE 88 08/07/2014 0858   BUN 26 (H) 12/10/2016 0530   BUN 25.2 08/07/2014 0858   CREATININE 1.50 (H) 12/10/2016 0530   CREATININE 0.9 08/07/2014 0858   CALCIUM 8.1 (L) 12/10/2016 0530   CALCIUM 9.7 08/07/2014 0858   PROT 5.4 (L) 12/10/2016 0530   PROT 6.3 (L) 08/07/2014 0858   ALBUMIN 2.2 (L) 12/10/2016 0530   ALBUMIN 3.7 08/07/2014 0858   AST 25 12/10/2016 0530   AST 17 08/07/2014 0858   ALT 8 (L) 12/10/2016 0530   ALT 15 08/07/2014 0858   ALKPHOS 53 12/10/2016 0530   ALKPHOS 43 08/07/2014 0858   BILITOT 1.1 12/10/2016 0530   BILITOT 0.36 08/07/2014 0858   GFRNONAA 33 (L) 12/10/2016 0530   GFRAA 39 (L) 12/10/2016 0530   Lipase     Component Value Date/Time   LIPASE 17 12/08/2016 1253       Studies/Results: Ct Abdomen Pelvis Wo Contrast  Result Date: 12/09/2016 CLINICAL DATA:  76 year old female with a history of TAHBSO in 2015 with 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 Patient admitted with abdominal distention, bowel obstruction, bilateral hydronephrosis and large pelvic mass at sonography. EXAM: CT CHEST, ABDOMEN AND PELVIS WITHOUT CONTRAST TECHNIQUE: Multidetector CT imaging of the chest, abdomen and pelvis was performed following the standard protocol without IV contrast. COMPARISON:  Abdominal sonogram from earlier today. 06/05/2015  chest CT. 08/07/2014 CT abdomen/pelvis. FINDINGS: CT CHEST FINDINGS Cardiovascular: Normal heart size. Trace pericardial effusion/ thickening is not appreciably changed. Left anterior descending coronary atherosclerosis. Right internal jugular MediPort terminates in the middle third of the superior vena cava. Atherosclerotic nonaneurysmal thoracic aorta. Normal caliber pulmonary arteries. Mediastinum/Nodes: No discrete thyroid nodules. Enteric tube terminates in the body of the stomach. Oral contrast in the mid to lower thoracic esophagus suggests mild gastroesophageal reflux. No pathologically enlarged axillary,  mediastinal or gross hilar lymph nodes, noting limited sensitivity for the detection of hilar adenopathy on this noncontrast study. Lungs/Pleura: No pneumothorax. Trace dependent bilateral pleural effusions. Subsegmental compressive atelectasis in the dependent lower lobes bilaterally. Otherwise no acute consolidative airspace disease. No lung masses or significant pulmonary nodules in the aerated portions of the lungs. Musculoskeletal: No aggressive appearing focal osseous lesions. Mild thoracic spondylosis. CT ABDOMEN PELVIS FINDINGS Hepatobiliary: Normal liver with no liver mass. Mildly distended gallbladder. No radiopaque cholelithiasis. No definite gallbladder wall thickening. No pericholecystic fluid. No biliary ductal dilatation. Pancreas: Normal, with no mass or duct dilation. Spleen: Normal size. No mass. Adrenals/Urinary Tract: Normal adrenals. Moderate to marked bilateral hydroureteronephrosis to the level of the lower lumbar ureters bilaterally. No urolithiasis. Simple 1.6 cm renal cyst in the anterior interpolar right kidney. Mixed density 2.5 x 2.3 cm renal cortical mass in the posterior upper right kidney (series 2/ image 58) with significant internal regions of macroscopic fat density, increased from 1.8 x 1.6 cm, compatible with a growing renal angiomyolipoma. Simple partially exophytic 1.5 cm renal cyst in the lateral lower left kidney. No additional contour deforming renal lesions. Compressed, nearly collapsed and grossly normal bladder. Stomach/Bowel: Collapsed and grossly normal stomach noting enteric tube tip in the distal body of the stomach. There is a very large predominantly solid 30.9 x 20.2 x 23.9 cm mass filling much of the lower abdomen and pelvis (series 2/ image 82), which is new since 08/07/2014, which is continuous inferiorly with the vaginal cuff, which contains heterogeneous internal density including significant central regions of gas. This large mass is intimately associated  with multiple small bowel loops and with the sigmoid colon. There is a suggestion of direct fistulization of the mass with the mid sigmoid lumen (series 5/images 59-61). The small bowel is decompressed with no significantly dilated small bowel loops. Oral contrast extends only to the mid small bowel. Distal small bowel loops are collapsed. Large bowel is relatively collapsed with mild stool throughout the large bowel. No regions of small or large bowel wall thickening. Vascular/Lymphatic: Atherosclerotic nonaneurysmal abdominal aorta. No pathologically enlarged lymph nodes in the abdomen or pelvis. Reproductive: Status post hysterectomy and bilateral oophorectomy. Other: No free intraperitoneal air. No focal fluid collections. There is compression of the common iliac veins bilaterally by the mass with anasarca throughout the pelvis. Musculoskeletal: No aggressive appearing focal osseous lesions. Mild lumbar spondylosis. IMPRESSION: 1. Very large predominantly solid 30.9 x 20.2 x 23.9 cm mass filling much of the lower abdomen and pelvis, continuous inferiorly with the vaginal cuff, intimately associated with multiple small bowel loops and with the sigmoid colon, with probable direct fistulization of the mass with the mid sigmoid lumen, with large amount of gas centrally within the mass. Findings are compatible with recurrent neoplasm. 2. No evidence of metastatic disease in the chest. No evidence of nodal, visceral or osseous metastatic disease. 3. Bilateral hydroureteronephrosis due to distal lumbar ureteral obstruction by the mass. 4. Trace dependent bilateral pleural effusions. Compression of the common iliac veins  by the mass with pelvic anasarca. 5. 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 6. Aortic atherosclerosis.  One vessel coronary atherosclerosis. Electronically Signed   By: Ilona Sorrel M.D.   On: 12/09/2016 20:59   Ct Chest Wo Contrast  Result Date:  12/09/2016 CLINICAL DATA:  76 year old female with a history of TAHBSO in 2015 with 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 Patient admitted with abdominal distention, bowel obstruction, bilateral hydronephrosis and large pelvic mass at sonography. EXAM: CT CHEST, ABDOMEN AND PELVIS WITHOUT CONTRAST TECHNIQUE: Multidetector CT imaging of the chest, abdomen and pelvis was performed following the standard protocol without IV contrast. COMPARISON:  Abdominal sonogram from earlier today. 06/05/2015 chest CT. 08/07/2014 CT abdomen/pelvis. FINDINGS: CT CHEST FINDINGS Cardiovascular: Normal heart size. Trace pericardial effusion/ thickening is not appreciably changed. Left anterior descending coronary atherosclerosis. Right internal jugular MediPort terminates in the middle third of the superior vena cava. Atherosclerotic nonaneurysmal thoracic aorta. Normal caliber pulmonary arteries. Mediastinum/Nodes: No discrete thyroid nodules. Enteric tube terminates in the body of the stomach. Oral contrast in the mid to lower thoracic esophagus suggests mild gastroesophageal reflux. No pathologically enlarged axillary, mediastinal or gross hilar lymph nodes, noting limited sensitivity for the detection of hilar adenopathy on this noncontrast study. Lungs/Pleura: No pneumothorax. Trace dependent bilateral pleural effusions. Subsegmental compressive atelectasis in the dependent lower lobes bilaterally. Otherwise no acute consolidative airspace disease. No lung masses or significant pulmonary nodules in the aerated portions of the lungs. Musculoskeletal: No aggressive appearing focal osseous lesions. Mild thoracic spondylosis. CT ABDOMEN PELVIS FINDINGS Hepatobiliary: Normal liver with no liver mass. Mildly distended gallbladder. No radiopaque cholelithiasis. No definite gallbladder wall thickening. No pericholecystic fluid. No biliary ductal dilatation. Pancreas: Normal, with no mass or duct dilation. Spleen:  Normal size. No mass. Adrenals/Urinary Tract: Normal adrenals. Moderate to marked bilateral hydroureteronephrosis to the level of the lower lumbar ureters bilaterally. No urolithiasis. Simple 1.6 cm renal cyst in the anterior interpolar right kidney. Mixed density 2.5 x 2.3 cm renal cortical mass in the posterior upper right kidney (series 2/ image 58) with significant internal regions of macroscopic fat density, increased from 1.8 x 1.6 cm, compatible with a growing renal angiomyolipoma. Simple partially exophytic 1.5 cm renal cyst in the lateral lower left kidney. No additional contour deforming renal lesions. Compressed, nearly collapsed and grossly normal bladder. Stomach/Bowel: Collapsed and grossly normal stomach noting enteric tube tip in the distal body of the stomach. There is a very large predominantly solid 30.9 x 20.2 x 23.9 cm mass filling much of the lower abdomen and pelvis (series 2/ image 82), which is new since 08/07/2014, which is continuous inferiorly with the vaginal cuff, which contains heterogeneous internal density including significant central regions of gas. This large mass is intimately associated with multiple small bowel loops and with the sigmoid colon. There is a suggestion of direct fistulization of the mass with the mid sigmoid lumen (series 5/images 59-61). The small bowel is decompressed with no significantly dilated small bowel loops. Oral contrast extends only to the mid small bowel. Distal small bowel loops are collapsed. Large bowel is relatively collapsed with mild stool throughout the large bowel. No regions of small or large bowel wall thickening. Vascular/Lymphatic: Atherosclerotic nonaneurysmal abdominal aorta. No pathologically enlarged lymph nodes in the abdomen or pelvis. Reproductive: Status post hysterectomy and bilateral oophorectomy. Other: No free intraperitoneal air. No focal fluid collections. There is compression of the common iliac veins bilaterally by the mass  with anasarca throughout the pelvis. Musculoskeletal: No aggressive appearing focal osseous lesions. Mild lumbar spondylosis. IMPRESSION: 1. Very large predominantly solid 30.9 x 20.2 x 23.9 cm mass filling much of the lower abdomen and pelvis, continuous inferiorly with the vaginal cuff, intimately associated with multiple small bowel loops and with the sigmoid colon, with probable direct fistulization of the mass with the mid sigmoid lumen, with large amount of gas centrally within the mass. Findings are compatible with recurrent neoplasm. 2. No evidence of metastatic disease in the chest. No evidence of nodal, visceral or osseous metastatic disease. 3. Bilateral hydroureteronephrosis due to distal lumbar ureteral obstruction by the mass. 4. Trace dependent bilateral pleural effusions. Compression of the common iliac veins by the mass with pelvic anasarca. 5. 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 6. Aortic atherosclerosis.  One vessel coronary atherosclerosis. Electronically Signed   By: Ilona Sorrel M.D.   On: 12/09/2016 20:59   US Abdomen Complete  Result Date: 12/09/2016 CLINICAL DATA:  Abdominal distention.  Ovarian cancer . EXAM: ABDOMEN ULTRASOUND COMPLETE COMPARISON:  KUB 12/08/2016.  CT 08/07/2014 . FINDINGS: Gallbladder: No gallstones or wall thickening visualized. No sonographic Murphy sign noted by sonographer. Common bile duct: Diameter: 6.9 mm Liver: No focal lesion identified. Within normal limits in parenchymal echogenicity. IVC: No abnormality visualized. Pancreas: Not visualized. Spleen: Size and appearance within normal limits. Right Kidney: Length: 10.4 cm. Echogenicity within normal limits. Moderate hydronephrosis noted . 3.2 x 2.6 x 2.7 cm solid hyperechoic lesion right upper renal pole . The lesion has increased in size from 1.9 cm. Although a renal cell carcinoma cannot be excluded, this most likely represents a enlarging angiomyolipoma given  prior CT finding. 1.8 x 1.9 x 2.4 cm simple cyst lower pole right kidney again noted. Left Kidney: Length: 9.8 cm. Echogenicity within normal limits. Moderate hydronephrosis noted. 1.1 x 0.7 x 0.9 cm simple cyst. Abdominal aorta: No aneurysm visualized. Other findings: Large solid pelvic mass measuring 28.0 x 17.0 x 24.3 cm IMPRESSION: 1. Large solid pelvic mass with maximum diameter of 28.0 cm noted. 2. Bilateral moderate hydronephrosis. 2 3.2 cm hyperechoic lesion right upper renal pole. 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 Electronically Signed   By: Marcello Moores  Register   On: 12/09/2016 11:39   Dg Abd Acute W/chest  Result Date: 12/08/2016 CLINICAL DATA:  Patient has had abdominal distention for 2 months. Went to her PCP and they told her her bowels were locked up and gave her a prescription for xanax. Patient stated it helped and she started using the bathroom again. EXAM: DG ABDOMEN ACUTE W/ 1V CHEST COMPARISON:  None. FINDINGS: Small bowel dilatation with multiple air-fluid levels. Small bowel measures up to 3.3 cm. Large amount of stool in the ascending colon. No radiopaque calculi or other significant radiographic abnormality is seen. Heart size and mediastinal contours are within normal limits. Both lungs are clear. Right-sided Port-A-Cath in satisfactory position. IMPRESSION: Small bowel dilatation with multiple air-fluid levels concerning for small bowel obstruction versus enteritis. No acute cardiopulmonary disease. Electronically Signed   By: Kathreen Devoid   On: 12/08/2016 14:23   Dg Abd Portable 1v  Result Date: 12/08/2016 CLINICAL DATA:  Nasogastric tube placement.  Initial encounter. EXAM: PORTABLE ABDOMEN - 1 VIEW COMPARISON:  Abdominal radiograph performed earlier today at 2:07 p.m. FINDINGS: The patient's enteric tube is noted coiled at the body of the stomach, ending at the  gastroesophageal junction. The stomach is relatively  decompressed. There appears to be displacement of bowel by a large lower abdominal mass, likely reflecting the patient's known ovarian cancer. No free intra-abdominal air is seen. The visualized lung bases are clear. Mild degenerative change is noted at the lower lumbar spine. IMPRESSION: 1. Enteric tube noted coiled at the body of the stomach, ending at the gastroesophageal junction. The stomach is relatively decompressed. 2. Displacement of bowel by a large lower abdominal mass, likely reflecting the patient's known ovarian cancer. No free intra-abdominal air seen. Electronically Signed   By: Garald Balding M.D.   On: 12/08/2016 19:44    Anti-infectives: Anti-infectives    Start     Dose/Rate Route Frequency Ordered Stop   12/08/16 1700  cefTRIAXone (ROCEPHIN) 1 g in dextrose 5 % 50 mL IVPB     1 g 100 mL/hr over 30 Minutes Intravenous Every 24 hours 12/08/16 1606         Assessment/Plan GI bleed with melena Pelvic Mass and bowel obstruction  - CT scan shows 30.9 x 20.2 x 23.9 cm predominantly solid mass in the lower abdomen and pelvis, continuous with vaginal cuff and associated with multiple small bowel loops and with the sigmoid colon; probable direct fistulization of the mass with the mid sigmoid lumen; No evidence of metastatic disease  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 Will review CT scan with MD.    LOS: 2 days    Jerrye Beavers , [facility] Surgery 12/10/2016, 8:04 AM Pager: [phone] Consults: [phone] Mon-Fri 7:00 am-4:30 pm Sat-Sun 7:00 am-11:30 am   Agree with  above.  CT scan with tumor which replaces most of the peritoneal cavity.  There is very little we have to offer. Dr. Delsa Sale discussed possible extirpating procedure - but that would be done at a medical center. 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  Dr. Alvy Bimler and Wyline Copas at bedside.  I showed the CT scan images to her daughter, Bonnita Nasuti, who lives in town.  Husband at bedside, but he has Alzheimers.  Consider involving palliative care?  Alphonsa Overall, MD, [facility] Surgery Pager: [phone] Office phone:  [phone]

## 2016-12-10 NOTE — Progress Notes (Signed)
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   2 - Acute Renal Failure - Baseline Cr 1.1 (2015) with rise to 1.6 by labs 11/2016, bilateral severe hydro as per above.  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 [place] 11/2016 with very large pelvic mass c/w likely local recurrence. CT with massive pelvic mass with ? Invasion of bowel and significant compression of great veins, ureters, bowel.   PMH sig for GYN cancer, otherwise unremarkable. No ischemic CV disease / blood thinners. She lives at [facility] home with her husband who has severe dementia.   Today "Joanne Copeland" is seen in f/u above. Her GFR is stable. No flank pain per se.   Objective: Vital signs in last 24 hours: Temp:  [98.1 F (36.7 C)-98.8 F (37.1 C)] 98.6 F (37 C) (04/25 0449) Pulse Rate:  [86-89] 87 (04/25 0449) Resp:  [18-27] 20 (04/25 0449) BP: (103-148)/(30-56) 114/50 (04/25 0449) SpO2:  [91 %-96 %] 94 % (04/25 0449) Weight:  [76.2 kg (167 lb 15.9 oz)] 76.2 kg (167 lb 15.9 oz) (04/25 0500) Last BM Date: 12/09/16  Intake/Output from previous day: 04/24 0701 - 04/25 0700 In: 3011.9 [P.O.:200; I.V.:2761.9; IV Piggyback:50] Out: 228 [Urine:2; Emesis/NG output:225; Stool:1] Intake/Output this shift: No intake/output data recorded.  General appearance: alert, cooperative, appears stated age and daughter and husband at bedside today.  Eyes: negative Nose: Nares normal. Septum midline. Mucosa normal. No drainage or sinus tenderness. Throat: lips, mucosa, and tongue normal; teeth and gums normal Back: symmetric, no curvature. ROM normal. No CVA tenderness. Resp: non-labored Cardio: Nl rate GI: significant distension with large solid feelign mass occupying majority of abdominal  cavity.  Extremities: LE edema noted.  Skin: Skin color, texture, turgor normal. No rashes or lesions Lymph nodes: Cervical, supraclavicular, and axillary nodes normal. Neurologic: Grossly normal  Lab Results:   Recent Labs  12/09/16 1208 12/10/16 0530  WBC 13.3* 12.6*  HGB 9.6* 9.1*  HCT 29.3* 27.8*  PLT 370 370   BMET  Recent Labs  12/09/16 0611 12/10/16 0530  NA 136 138  K 3.5 3.7  CL 101 105  CO2 17* 18*  GLUCOSE 102* 92  BUN 27* 26*  CREATININE 1.60* 1.50*  CALCIUM 8.3* 8.1*   PT/INR  Recent Labs  12/08/16 1253  LABPROT 14.4  INR 1.11   ABG No results for input(s): PHART, HCO3 in the last 72 hours.  Invalid input(s): PCO2, PO2  Studies/Results: Ct Abdomen Pelvis Wo Contrast  Result Date: 12/09/2016 CLINICAL DATA:  76 year old female with a history of TAHBSO in 2015 with 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 Patient admitted with abdominal distention, bowel obstruction, bilateral hydronephrosis and large pelvic mass at sonography. EXAM: CT CHEST, ABDOMEN AND PELVIS WITHOUT CONTRAST TECHNIQUE: Multidetector CT imaging of the chest, abdomen and pelvis was performed following the standard protocol without IV contrast. COMPARISON:  Abdominal sonogram from earlier today. 06/05/2015 chest CT. 08/07/2014 CT abdomen/pelvis. FINDINGS: CT CHEST FINDINGS Cardiovascular: Normal heart size. Trace pericardial effusion/ thickening is not appreciably changed. Left anterior descending coronary atherosclerosis. Right internal jugular MediPort terminates in the middle third of the superior vena cava. Atherosclerotic nonaneurysmal thoracic aorta. Normal caliber pulmonary arteries. Mediastinum/Nodes: No discrete thyroid nodules. Enteric tube terminates in the  body of the stomach. Oral contrast in the mid to lower thoracic esophagus suggests mild gastroesophageal reflux. No pathologically enlarged axillary, mediastinal or gross hilar lymph nodes, noting limited  sensitivity for the detection of hilar adenopathy on this noncontrast study. Lungs/Pleura: No pneumothorax. Trace dependent bilateral pleural effusions. Subsegmental compressive atelectasis in the dependent lower lobes bilaterally. Otherwise no acute consolidative airspace disease. No lung masses or significant pulmonary nodules in the aerated portions of the lungs. Musculoskeletal: No aggressive appearing focal osseous lesions. Mild thoracic spondylosis. CT ABDOMEN PELVIS FINDINGS Hepatobiliary: Normal liver with no liver mass. Mildly distended gallbladder. No radiopaque cholelithiasis. No definite gallbladder wall thickening. No pericholecystic fluid. No biliary ductal dilatation. Pancreas: Normal, with no mass or duct dilation. Spleen: Normal size. No mass. Adrenals/Urinary Tract: Normal adrenals. Moderate to marked bilateral hydroureteronephrosis to the level of the lower lumbar ureters bilaterally. No urolithiasis. Simple 1.6 cm renal cyst in the anterior interpolar right kidney. Mixed density 2.5 x 2.3 cm renal cortical mass in the posterior upper right kidney (series 2/ image 58) with significant internal regions of macroscopic fat density, increased from 1.8 x 1.6 cm, compatible with a growing renal angiomyolipoma. Simple partially exophytic 1.5 cm renal cyst in the lateral lower left kidney. No additional contour deforming renal lesions. Compressed, nearly collapsed and grossly normal bladder. Stomach/Bowel: Collapsed and grossly normal stomach noting enteric tube tip in the distal body of the stomach. There is a very large predominantly solid 30.9 x 20.2 x 23.9 cm mass filling much of the lower abdomen and pelvis (series 2/ image 82), which is new since 08/07/2014, which is continuous inferiorly with the vaginal cuff, which contains heterogeneous internal density including significant central regions of gas. This large mass is intimately associated with multiple small bowel loops and with the sigmoid  colon. There is a suggestion of direct fistulization of the mass with the mid sigmoid lumen (series 5/images 59-61). The small bowel is decompressed with no significantly dilated small bowel loops. Oral contrast extends only to the mid small bowel. Distal small bowel loops are collapsed. Large bowel is relatively collapsed with mild stool throughout the large bowel. No regions of small or large bowel wall thickening. Vascular/Lymphatic: Atherosclerotic nonaneurysmal abdominal aorta. No pathologically enlarged lymph nodes in the abdomen or pelvis. Reproductive: Status post hysterectomy and bilateral oophorectomy. Other: No free intraperitoneal air. No focal fluid collections. There is compression of the common iliac veins bilaterally by the mass with anasarca throughout the pelvis. Musculoskeletal: No aggressive appearing focal osseous lesions. Mild lumbar spondylosis. IMPRESSION: 1. Very large predominantly solid 30.9 x 20.2 x 23.9 cm mass filling much of the lower abdomen and pelvis, continuous inferiorly with the vaginal cuff, intimately associated with multiple small bowel loops and with the sigmoid colon, with probable direct fistulization of the mass with the mid sigmoid lumen, with large amount of gas centrally within the mass. Findings are compatible with recurrent neoplasm. 2. No evidence of metastatic disease in the chest. No evidence of nodal, visceral or osseous metastatic disease. 3. Bilateral hydroureteronephrosis due to distal lumbar ureteral obstruction by the mass. 4. Trace dependent bilateral pleural effusions. Compression of the common iliac veins by the mass with pelvic anasarca. 5. 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 6. Aortic atherosclerosis.  One vessel coronary atherosclerosis. Electronically Signed   By: Ilona Sorrel M.D.   On: 12/09/2016 20:59   Ct Chest Wo Contrast  Result Date: 12/09/2016 CLINICAL DATA:  76 year old female with  a history of  TAHBSO in 2015 with 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 Patient admitted with abdominal distention, bowel obstruction, bilateral hydronephrosis and large pelvic mass at sonography. EXAM: CT CHEST, ABDOMEN AND PELVIS WITHOUT CONTRAST TECHNIQUE: Multidetector CT imaging of the chest, abdomen and pelvis was performed following the standard protocol without IV contrast. COMPARISON:  Abdominal sonogram from earlier today. 06/05/2015 chest CT. 08/07/2014 CT abdomen/pelvis. FINDINGS: CT CHEST FINDINGS Cardiovascular: Normal heart size. Trace pericardial effusion/ thickening is not appreciably changed. Left anterior descending coronary atherosclerosis. Right internal jugular MediPort terminates in the middle third of the superior vena cava. Atherosclerotic nonaneurysmal thoracic aorta. Normal caliber pulmonary arteries. Mediastinum/Nodes: No discrete thyroid nodules. Enteric tube terminates in the body of the stomach. Oral contrast in the mid to lower thoracic esophagus suggests mild gastroesophageal reflux. No pathologically enlarged axillary, mediastinal or gross hilar lymph nodes, noting limited sensitivity for the detection of hilar adenopathy on this noncontrast study. Lungs/Pleura: No pneumothorax. Trace dependent bilateral pleural effusions. Subsegmental compressive atelectasis in the dependent lower lobes bilaterally. Otherwise no acute consolidative airspace disease. No lung masses or significant pulmonary nodules in the aerated portions of the lungs. Musculoskeletal: No aggressive appearing focal osseous lesions. Mild thoracic spondylosis. CT ABDOMEN PELVIS FINDINGS Hepatobiliary: Normal liver with no liver mass. Mildly distended gallbladder. No radiopaque cholelithiasis. No definite gallbladder wall thickening. No pericholecystic fluid. No biliary ductal dilatation. Pancreas: Normal, with no mass or duct dilation. Spleen: Normal size. No mass. Adrenals/Urinary Tract: Normal adrenals.  Moderate to marked bilateral hydroureteronephrosis to the level of the lower lumbar ureters bilaterally. No urolithiasis. Simple 1.6 cm renal cyst in the anterior interpolar right kidney. Mixed density 2.5 x 2.3 cm renal cortical mass in the posterior upper right kidney (series 2/ image 58) with significant internal regions of macroscopic fat density, increased from 1.8 x 1.6 cm, compatible with a growing renal angiomyolipoma. Simple partially exophytic 1.5 cm renal cyst in the lateral lower left kidney. No additional contour deforming renal lesions. Compressed, nearly collapsed and grossly normal bladder. Stomach/Bowel: Collapsed and grossly normal stomach noting enteric tube tip in the distal body of the stomach. There is a very large predominantly solid 30.9 x 20.2 x 23.9 cm mass filling much of the lower abdomen and pelvis (series 2/ image 82), which is new since 08/07/2014, which is continuous inferiorly with the vaginal cuff, which contains heterogeneous internal density including significant central regions of gas. This large mass is intimately associated with multiple small bowel loops and with the sigmoid colon. There is a suggestion of direct fistulization of the mass with the mid sigmoid lumen (series 5/images 59-61). The small bowel is decompressed with no significantly dilated small bowel loops. Oral contrast extends only to the mid small bowel. Distal small bowel loops are collapsed. Large bowel is relatively collapsed with mild stool throughout the large bowel. No regions of small or large bowel wall thickening. Vascular/Lymphatic: Atherosclerotic nonaneurysmal abdominal aorta. No pathologically enlarged lymph nodes in the abdomen or pelvis. Reproductive: Status post hysterectomy and bilateral oophorectomy. Other: No free intraperitoneal air. No focal fluid collections. There is compression of the common iliac veins bilaterally by the mass with anasarca throughout the pelvis. Musculoskeletal: No  aggressive appearing focal osseous lesions. Mild lumbar spondylosis. IMPRESSION: 1. Very large predominantly solid 30.9 x 20.2 x 23.9 cm mass filling much of the lower abdomen and pelvis, continuous inferiorly with the vaginal cuff, intimately associated with multiple small bowel loops and with the sigmoid colon, with probable direct  fistulization of the mass with the mid sigmoid lumen, with large amount of gas centrally within the mass. Findings are compatible with recurrent neoplasm. 2. No evidence of metastatic disease in the chest. No evidence of nodal, visceral or osseous metastatic disease. 3. Bilateral hydroureteronephrosis due to distal lumbar ureteral obstruction by the mass. 4. Trace dependent bilateral pleural effusions. Compression of the common iliac veins by the mass with pelvic anasarca. 5. 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 6. Aortic atherosclerosis.  One vessel coronary atherosclerosis. Electronically Signed   By: Ilona Sorrel M.D.   On: 12/09/2016 20:59   US Abdomen Complete  Result Date: 12/09/2016 CLINICAL DATA:  Abdominal distention.  Ovarian cancer . EXAM: ABDOMEN ULTRASOUND COMPLETE COMPARISON:  [facility] 12/08/2016.  CT 08/07/2014 . FINDINGS: Gallbladder: No gallstones or wall thickening visualized. No sonographic Murphy sign noted by sonographer. Common bile duct: Diameter: 6.9 mm Liver: No focal lesion identified. Within normal limits in parenchymal echogenicity. IVC: No abnormality visualized. Pancreas: Not visualized. Spleen: Size and appearance within normal limits. Right Kidney: Length: 10.4 cm. Echogenicity within normal limits. Moderate hydronephrosis noted . 3.2 x 2.6 x 2.7 cm solid hyperechoic lesion right upper renal pole . The lesion has increased in size from 1.9 cm. 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 1.8 x 1.9 x 2.4 cm simple cyst lower pole  right kidney again noted. Left Kidney: Length: 9.8 cm. Echogenicity within normal limits. Moderate hydronephrosis noted. 1.1 x 0.7 x 0.9 cm simple cyst. Abdominal aorta: No aneurysm visualized. Other findings: Large solid pelvic mass measuring 28.0 x 17.0 x 24.3 cm IMPRESSION: 1. Large solid pelvic mass with maximum diameter of 28.0 cm noted. 2. Bilateral moderate hydronephrosis. 2 3.2 cm hyperechoic lesion right upper renal pole. 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 Electronically Signed   By: Marcello Moores  Register   On: 12/09/2016 11:39   Dg Abd Acute W/chest  Result Date: 12/08/2016 CLINICAL DATA:  Patient has had abdominal distention for 2 months. Went to her PCP and they told her her bowels were locked up and gave her a prescription for xanax. Patient stated it helped and she started using the bathroom again. EXAM: DG ABDOMEN ACUTE W/ 1V CHEST COMPARISON:  None. FINDINGS: Small bowel dilatation with multiple air-fluid levels. Small bowel measures up to 3.3 cm. Large amount of stool in the ascending colon. No radiopaque calculi or other significant radiographic abnormality is seen. Heart size and mediastinal contours are within normal limits. Both lungs are clear. Right-sided Port-A-Cath in satisfactory position. IMPRESSION: Small bowel dilatation with multiple air-fluid levels concerning for small bowel obstruction versus enteritis. No acute cardiopulmonary disease. Electronically Signed   By: Kathreen Devoid   On: 12/08/2016 14:23   Dg Abd Portable 1v  Result Date: 12/08/2016 CLINICAL DATA:  Nasogastric tube placement.  Initial encounter. EXAM: PORTABLE ABDOMEN - 1 VIEW COMPARISON:  Abdominal radiograph performed earlier today at 2:07 p.m. FINDINGS: The patient's enteric tube is noted coiled at the body of the stomach, ending at the gastroesophageal junction. The stomach is relatively decompressed. There appears to be displacement of bowel by a  large lower abdominal mass, likely reflecting the patient's known ovarian cancer. No free intra-abdominal air is seen. The visualized lung bases are clear. Mild degenerative change is noted at the lower lumbar spine. IMPRESSION: 1. Enteric tube noted coiled at  the body of the stomach, ending at the gastroesophageal junction. The stomach is relatively decompressed. 2. Displacement of bowel by a large lower abdominal mass, likely reflecting the patient's known ovarian cancer. No free intra-abdominal air seen. Electronically Signed   By: Garald Balding M.D.   On: 12/08/2016 19:44    Anti-infectives: Anti-infectives    Start     Dose/Rate Route Frequency Ordered Stop   12/08/16 1700  cefTRIAXone (ROCEPHIN) 1 g in dextrose 5 % 50 mL IVPB     1 g 100 mL/hr over 30 Minutes Intravenous Every 24 hours 12/08/16 1606        Assessment/Plan:  1 - Bilateral Malignant Hydronephrosis - rediscussed options of observation (GFR acceptable, this makes most sens if pt desires palliative transition) v. neph tubes v. Stents. We all agree on observation for now.   2 - Acute Renal Failure - this is from compression from recurrent cancer below. Plan as per above. Improved some with hydration.   3 - Large Pelvic Mass / H/o Metastatic GYN Cancer - this appears unresectable. Pt has avoided cancer specific care for years. Frankly discussed likely poor prognosis (67mos or less) with or without aggressive treatment. Will defer further opinion to GYN-ONC, but palliative / hospice transition seems most appropriate. Pt, daughter, husband voiced understanding and seem amenable.  Please call me directly with questions.  [facility], Bowen Goyal 12/10/2016

## 2016-12-10 NOTE — Progress Notes (Signed)
Received patient post Biopsy, abd punctured site with band aid clean dry and intact. Patient no complaints of discomfort at this time.

## 2016-12-10 NOTE — H&P (Signed)
Chief Complaint: Abdominal mass  Referring Physician(s): Donne Hazel  Supervising Physician: Arne Cleveland  Patient Status: [facility] - In-pt  History of Present Illness: Joanne Copeland is a 76 y.o. female with history of fallopian tube cancer.  She underwent hysterectomy and salpingo-oophorectomy back in 2015  She presented to the ED c/o black stools and recent severe constipation.  She states she noticed her abdomen "growing" over the past couple of months but really assumed it was because her bowel habits had changed and she was more constipated.  CT scan revealed a very large predominantly solid 30.9 x 20.2 x 23.9 cm mass filling much of the lower abdomen and pelvis (series 2/ image 82), which is new since 08/07/2014.  We are asked to biopsy this  She is NPO. She does not take blood thinners.  Past Medical History:  Diagnosis Date  . Hypercholesterolemia   . Hypertension   . Ovarian cancer ([place])   . Pelvic mass     Past Surgical History:  Procedure Laterality Date  . omentectomy PPLND  01/05/2014  . radical tumor debulking  01/05/2014  . TOTAL ABDOMINAL HYSTERECTOMY W/ BILATERAL SALPINGOOPHORECTOMY  01/05/2014    Allergies: Food and Latex  Medications: Prior to Admission medications   Medication Sig Start Date End Date Taking? Authorizing Provider  amLODipine (NORVASC) 5 MG tablet Take 5 mg by mouth at bedtime.   Yes Historical Provider, MD  aspirin EC 325 MG tablet Take 325 mg by mouth at bedtime.   Yes Historical Provider, MD  cholecalciferol (VITAMIN D) 1000 UNITS tablet Take 1,000 Units by mouth daily.   Yes Historical Provider, MD  folic acid (FOLVITE) 1 MG tablet Take 1 mg by mouth daily.   Yes Historical Provider, MD  lactobacillus acidophilus (BACID) TABS tablet Take 1 tablet by mouth daily.   Yes Historical Provider, MD  omega-3 acid ethyl esters (LOVAZA) 1 g capsule Take 1 g by mouth daily.   Yes Historical Provider, MD  pravastatin (PRAVACHOL) 10  MG tablet Take 10 mg by mouth at bedtime.    Yes Historical Provider, MD  Prenatal Vit-Fe Fumarate-FA (PRENATAL MULTIVITAMIN) TABS tablet Take 1 tablet by mouth daily.    Yes Historical Provider, MD  sennosides-docusate sodium (SENOKOT-S) 8.6-50 MG tablet Take 1-2 tablets by mouth 2 (two) times daily as needed for constipation.    Yes Lennis Marion Downer, MD  vitamin B-12 (CYANOCOBALAMIN) 1000 MCG tablet Take 1,000 mcg by mouth daily.   Yes Historical Provider, MD  Vitamin E 100 UNITS TABS Take 100 Units by mouth daily.    Yes Historical Provider, MD     Family History  Problem Relation Age of Onset  . Prostate cancer Brother   . Heart attack Father 76    Social History   Social History  . Marital status: Married    Spouse name: N/A  . Number of children: N/A  . Years of education: N/A   Social History Main Topics  . Smoking status: Never Smoker  . Smokeless tobacco: Never Used  . Alcohol use 4.2 oz/week    7 Glasses of wine per week     Comment: " Glass of wine per night & a cocktail"  . Drug use: No  . Sexual activity: Not Currently   Other Topics Concern  . None   Social History Narrative  . None    Review of Systems: A 12 point ROS discussed  Review of Systems  Constitutional: Positive for activity  change and fatigue. Negative for fever.  HENT: Negative.   Respiratory: Negative.   Cardiovascular: Negative.   Gastrointestinal: Positive for abdominal distention, abdominal pain, nausea and vomiting.  Genitourinary: Negative.   Musculoskeletal: Negative.   Skin: Negative.   Neurological: Negative.   Hematological: Negative.   Psychiatric/Behavioral: Negative.     Vital Signs: BP (!) 114/50 (BP Location: Right Arm)   Pulse 87   Temp 98.6 F (37 C) (Oral)   Resp 20   Ht 5\' 3"  (1.6 m)   Wt 167 lb 15.9 oz (76.2 kg)   SpO2 94%   BMI 29.76 kg/m   Physical Exam  Constitutional: She is oriented to person, place, and time. She appears well-developed.  HENT:    Head: Normocephalic and atraumatic.  Eyes: EOM are normal.  Neck: Normal range of motion.  Cardiovascular: Normal rate, regular rhythm and normal heart sounds.   No murmur heard. Pulmonary/Chest: Effort normal and breath sounds normal. No respiratory distress. She has no wheezes.  Abdominal:  Large palpable mass. Non-tender to palpation.  Musculoskeletal: Normal range of motion.  Neurological: She is alert and oriented to person, place, and time.  Skin: Skin is warm and dry.  Psychiatric: She has a normal mood and affect. Her behavior is normal. Judgment and thought content normal.  Vitals reviewed.   Mallampati Score:  MD Evaluation Airway: WNL Heart: WNL Abdomen: Other (comments) Abdomen comments: Round with large palpable abdominal mass Chest/ Lungs: WNL ASA  Classification: 3 Mallampati/Airway Score: Two  Imaging: Ct Abdomen Pelvis Wo Contrast  Result Date: 12/09/2016 CLINICAL DATA:  76 year old female with a history of TAHBSO in 2015 with 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 Patient admitted with abdominal distention, bowel obstruction, bilateral hydronephrosis and large pelvic mass at sonography. EXAM: CT CHEST, ABDOMEN AND PELVIS WITHOUT CONTRAST TECHNIQUE: Multidetector CT imaging of the chest, abdomen and pelvis was performed following the standard protocol without IV contrast. COMPARISON:  Abdominal sonogram from earlier today. 06/05/2015 chest CT. 08/07/2014 CT abdomen/pelvis. FINDINGS: CT CHEST FINDINGS Cardiovascular: Normal heart size. Trace pericardial effusion/ thickening is not appreciably changed. Left anterior descending coronary atherosclerosis. Right internal jugular MediPort terminates in the middle third of the superior vena cava. Atherosclerotic nonaneurysmal thoracic aorta. Normal caliber pulmonary arteries. Mediastinum/Nodes: No discrete thyroid nodules. Enteric tube terminates in the body of the stomach. Oral contrast in the mid to  lower thoracic esophagus suggests mild gastroesophageal reflux. No pathologically enlarged axillary, mediastinal or gross hilar lymph nodes, noting limited sensitivity for the detection of hilar adenopathy on this noncontrast study. Lungs/Pleura: No pneumothorax. Trace dependent bilateral pleural effusions. Subsegmental compressive atelectasis in the dependent lower lobes bilaterally. Otherwise no acute consolidative airspace disease. No lung masses or significant pulmonary nodules in the aerated portions of the lungs. Musculoskeletal: No aggressive appearing focal osseous lesions. Mild thoracic spondylosis. CT ABDOMEN PELVIS FINDINGS Hepatobiliary: Normal liver with no liver mass. Mildly distended gallbladder. No radiopaque cholelithiasis. No definite gallbladder wall thickening. No pericholecystic fluid. No biliary ductal dilatation. Pancreas: Normal, with no mass or duct dilation. Spleen: Normal size. No mass. Adrenals/Urinary Tract: Normal adrenals. Moderate to marked bilateral hydroureteronephrosis to the level of the lower lumbar ureters bilaterally. No urolithiasis. Simple 1.6 cm renal cyst in the anterior interpolar right kidney. Mixed density 2.5 x 2.3 cm renal cortical mass in the posterior upper right kidney (series 2/ image 58) with significant internal regions of macroscopic fat density, increased from 1.8 x 1.6 cm, compatible with a growing renal angiomyolipoma. Simple  partially exophytic 1.5 cm renal cyst in the lateral lower left kidney. No additional contour deforming renal lesions. Compressed, nearly collapsed and grossly normal bladder. Stomach/Bowel: Collapsed and grossly normal stomach noting enteric tube tip in the distal body of the stomach. There is a very large predominantly solid 30.9 x 20.2 x 23.9 cm mass filling much of the lower abdomen and pelvis (series 2/ image 82), which is new since 08/07/2014, which is continuous inferiorly with the vaginal cuff, which contains heterogeneous  internal density including significant central regions of gas. This large mass is intimately associated with multiple small bowel loops and with the sigmoid colon. There is a suggestion of direct fistulization of the mass with the mid sigmoid lumen (series 5/images 59-61). The small bowel is decompressed with no significantly dilated small bowel loops. Oral contrast extends only to the mid small bowel. Distal small bowel loops are collapsed. Large bowel is relatively collapsed with mild stool throughout the large bowel. No regions of small or large bowel wall thickening. Vascular/Lymphatic: Atherosclerotic nonaneurysmal abdominal aorta. No pathologically enlarged lymph nodes in the abdomen or pelvis. Reproductive: Status post hysterectomy and bilateral oophorectomy. Other: No free intraperitoneal air. No focal fluid collections. There is compression of the common iliac veins bilaterally by the mass with anasarca throughout the pelvis. Musculoskeletal: No aggressive appearing focal osseous lesions. Mild lumbar spondylosis. IMPRESSION: 1. Very large predominantly solid 30.9 x 20.2 x 23.9 cm mass filling much of the lower abdomen and pelvis, continuous inferiorly with the vaginal cuff, intimately associated with multiple small bowel loops and with the sigmoid colon, with probable direct fistulization of the mass with the mid sigmoid lumen, with large amount of gas centrally within the mass. Findings are compatible with recurrent neoplasm. 2. No evidence of metastatic disease in the chest. No evidence of nodal, visceral or osseous metastatic disease. 3. Bilateral hydroureteronephrosis due to distal lumbar ureteral obstruction by the mass. 4. Trace dependent bilateral pleural effusions. Compression of the common iliac veins by the mass with pelvic anasarca. 5. 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 6. Aortic atherosclerosis.  One vessel coronary atherosclerosis. Electronically  Signed   By: Ilona Sorrel M.D.   On: 12/09/2016 20:59   Ct Chest Wo Contrast  Result Date: 12/09/2016 CLINICAL DATA:  76 year old female with a history of TAHBSO in 2015 with 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 Patient admitted with abdominal distention, bowel obstruction, bilateral hydronephrosis and large pelvic mass at sonography. EXAM: CT CHEST, ABDOMEN AND PELVIS WITHOUT CONTRAST TECHNIQUE: Multidetector CT imaging of the chest, abdomen and pelvis was performed following the standard protocol without IV contrast. COMPARISON:  Abdominal sonogram from earlier today. 06/05/2015 chest CT. 08/07/2014 CT abdomen/pelvis. FINDINGS: CT CHEST FINDINGS Cardiovascular: Normal heart size. Trace pericardial effusion/ thickening is not appreciably changed. Left anterior descending coronary atherosclerosis. Right internal jugular MediPort terminates in the middle third of the superior vena cava. Atherosclerotic nonaneurysmal thoracic aorta. Normal caliber pulmonary arteries. Mediastinum/Nodes: No discrete thyroid nodules. Enteric tube terminates in the body of the stomach. Oral contrast in the mid to lower thoracic esophagus suggests mild gastroesophageal reflux. No pathologically enlarged axillary, mediastinal or gross hilar lymph nodes, noting limited sensitivity for the detection of hilar adenopathy on this noncontrast study. Lungs/Pleura: No pneumothorax. Trace dependent bilateral pleural effusions. Subsegmental compressive atelectasis in the dependent lower lobes bilaterally. Otherwise no acute consolidative airspace disease. No lung masses or significant pulmonary nodules in the aerated portions of the lungs.  Musculoskeletal: No aggressive appearing focal osseous lesions. Mild thoracic spondylosis. CT ABDOMEN PELVIS FINDINGS Hepatobiliary: Normal liver with no liver mass. Mildly distended gallbladder. No radiopaque cholelithiasis. No definite gallbladder wall thickening. No pericholecystic  fluid. No biliary ductal dilatation. Pancreas: Normal, with no mass or duct dilation. Spleen: Normal size. No mass. Adrenals/Urinary Tract: Normal adrenals. Moderate to marked bilateral hydroureteronephrosis to the level of the lower lumbar ureters bilaterally. No urolithiasis. Simple 1.6 cm renal cyst in the anterior interpolar right kidney. Mixed density 2.5 x 2.3 cm renal cortical mass in the posterior upper right kidney (series 2/ image 58) with significant internal regions of macroscopic fat density, increased from 1.8 x 1.6 cm, compatible with a growing renal angiomyolipoma. Simple partially exophytic 1.5 cm renal cyst in the lateral lower left kidney. No additional contour deforming renal lesions. Compressed, nearly collapsed and grossly normal bladder. Stomach/Bowel: Collapsed and grossly normal stomach noting enteric tube tip in the distal body of the stomach. There is a very large predominantly solid 30.9 x 20.2 x 23.9 cm mass filling much of the lower abdomen and pelvis (series 2/ image 82), which is new since 08/07/2014, which is continuous inferiorly with the vaginal cuff, which contains heterogeneous internal density including significant central regions of gas. This large mass is intimately associated with multiple small bowel loops and with the sigmoid colon. There is a suggestion of direct fistulization of the mass with the mid sigmoid lumen (series 5/images 59-61). The small bowel is decompressed with no significantly dilated small bowel loops. Oral contrast extends only to the mid small bowel. Distal small bowel loops are collapsed. Large bowel is relatively collapsed with mild stool throughout the large bowel. No regions of small or large bowel wall thickening. Vascular/Lymphatic: Atherosclerotic nonaneurysmal abdominal aorta. No pathologically enlarged lymph nodes in the abdomen or pelvis. Reproductive: Status post hysterectomy and bilateral oophorectomy. Other: No free intraperitoneal air. No  focal fluid collections. There is compression of the common iliac veins bilaterally by the mass with anasarca throughout the pelvis. Musculoskeletal: No aggressive appearing focal osseous lesions. Mild lumbar spondylosis. IMPRESSION: 1. Very large predominantly solid 30.9 x 20.2 x 23.9 cm mass filling much of the lower abdomen and pelvis, continuous inferiorly with the vaginal cuff, intimately associated with multiple small bowel loops and with the sigmoid colon, with probable direct fistulization of the mass with the mid sigmoid lumen, with large amount of gas centrally within the mass. Findings are compatible with recurrent neoplasm. 2. No evidence of metastatic disease in the chest. No evidence of nodal, visceral or osseous metastatic disease. 3. Bilateral hydroureteronephrosis due to distal lumbar ureteral obstruction by the mass. 4. Trace dependent bilateral pleural effusions. Compression of the common iliac veins by the mass with pelvic anasarca. 5. 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 6. Aortic atherosclerosis.  One vessel coronary atherosclerosis. Electronically Signed   By: Ilona Sorrel M.D.   On: 12/09/2016 20:59   US Abdomen Complete  Result Date: 12/09/2016 CLINICAL DATA:  Abdominal distention.  Ovarian cancer . EXAM: ABDOMEN ULTRASOUND COMPLETE COMPARISON:  KUB 12/08/2016.  CT 08/07/2014 . FINDINGS: Gallbladder: No gallstones or wall thickening visualized. No sonographic Murphy sign noted by sonographer. Common bile duct: Diameter: 6.9 mm Liver: No focal lesion identified. Within normal limits in parenchymal echogenicity. IVC: No abnormality visualized. Pancreas: Not visualized. Spleen: Size and appearance within normal limits. Right Kidney: Length: 10.4 cm. Echogenicity within normal limits. Moderate hydronephrosis noted . 3.2 x 2.6 x  2.7 cm solid hyperechoic lesion right upper renal pole . The lesion has increased in size from 1.9 cm. 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 1.8 x 1.9 x 2.4 cm simple cyst lower pole right kidney again noted. Left Kidney: Length: 9.8 cm. Echogenicity within normal limits. Moderate hydronephrosis noted. 1.1 x 0.7 x 0.9 cm simple cyst. Abdominal aorta: No aneurysm visualized. Other findings: Large solid pelvic mass measuring 28.0 x 17.0 x 24.3 cm IMPRESSION: 1. Large solid pelvic mass with maximum diameter of 28.0 cm noted. 2. Bilateral moderate hydronephrosis. 2 3.2 cm hyperechoic lesion right upper renal pole. 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 Electronically Signed   By: Marcello Moores  Register   On: 12/09/2016 11:39   Dg Abd Acute W/chest  Result Date: 12/08/2016 CLINICAL DATA:  Patient has had abdominal distention for 2 months. Went to her PCP and they told her her bowels were locked up and gave her a prescription for xanax. Patient stated it helped and she started using the bathroom again. EXAM: DG ABDOMEN ACUTE W/ 1V CHEST COMPARISON:  None. FINDINGS: Small bowel dilatation with multiple air-fluid levels. Small bowel measures up to 3.3 cm. Large amount of stool in the ascending colon. No radiopaque calculi or other significant radiographic abnormality is seen. Heart size and mediastinal contours are within normal limits. Both lungs are clear. Right-sided Port-A-Cath in satisfactory position. IMPRESSION: Small bowel dilatation with multiple air-fluid levels concerning for small bowel obstruction versus enteritis. No acute cardiopulmonary disease. Electronically Signed   By: Kathreen Devoid   On: 12/08/2016 14:23   Dg Abd Portable 1v  Result Date: 12/08/2016 CLINICAL DATA:  Nasogastric tube placement.  Initial encounter. EXAM: PORTABLE ABDOMEN - 1 VIEW COMPARISON:  Abdominal radiograph performed earlier today at 2:07 p.m. FINDINGS: The patient's enteric tube is noted coiled at the  body of the stomach, ending at the gastroesophageal junction. The stomach is relatively decompressed. There appears to be displacement of bowel by a large lower abdominal mass, likely reflecting the patient's known ovarian cancer. No free intra-abdominal air is seen. The visualized lung bases are clear. Mild degenerative change is noted at the lower lumbar spine. IMPRESSION: 1. Enteric tube noted coiled at the body of the stomach, ending at the gastroesophageal junction. The stomach is relatively decompressed. 2. Displacement of bowel by a large lower abdominal mass, likely reflecting the patient's known ovarian cancer. No free intra-abdominal air seen. Electronically Signed   By: Garald Balding M.D.   On: 12/08/2016 19:44    Labs:  CBC:  Recent Labs  12/08/16 2330 12/09/16 0611 12/09/16 1208 12/10/16 0530  WBC 13.2* 12.5* 13.3* 12.6*  HGB 8.3* 9.7* 9.6* 9.1*  HCT 25.7* 30.1* 29.3* 27.8*  PLT 387 384 370 370    COAGS:  Recent Labs  12/08/16 1253 12/09/16 0611  INR 1.11  --   APTT  --  32    BMP:  Recent Labs  12/08/16 1253 12/08/16 2330 12/09/16 0611 12/10/16 0530  NA 136 135 136 138  K 2.6* 3.2* 3.5 3.7  CL 101 102 101 105  CO2 17* 14* 17* 18*  GLUCOSE 76 98 102* 92  BUN 25* 26* 27* 26*  CALCIUM 8.0* 8.3* 8.3* 8.1*  CREATININE 1.63* 1.61* 1.60* 1.50*  GFRNONAA 30* 31* 31* 33*  GFRAA 35* 36* 36* 39*    LIVER FUNCTION TESTS:  Recent  Labs  12/08/16 1253 12/09/16 0611 12/10/16 0530  BILITOT 1.3* 1.3* 1.1  AST 25 26 25   ALT 7* 8* 8*  ALKPHOS 42 50 53  PROT 5.4* 5.7* 5.4*  ALBUMIN 2.5* 2.5* 2.2*    TUMOR MARKERS: No results for input(s): AFPTM, CEA, CA199, CHROMGRNA in the last 8760 hours.  Assessment and Plan:  Abdominal mass = history of fallopian tube cancer s/p TAH/BSO in 2015  Will proceed with image guided biopsy today by Dr. Vernard Gambles.  Risks and Benefits discussed with the patient including, but not limited to bleeding, infection, damage to  adjacent structures or low yield requiring additional tests.  All of the patient's questions were answered, patient is agreeable to proceed. Consent signed and in chart.  Thank you for this interesting consult.  I greatly enjoyed meeting Joanne Copeland and look forward to participating in their care.  A copy of this report was sent to the requesting provider on this date.  Electronically Signed: Murrell Redden PA-C 12/10/2016, 10:35 AM   I spent a total of 40 Minutes in face to face in clinical consultation, greater than 50% of which was counseling/coordinating care for biopsy of abdominal mass

## 2016-12-10 NOTE — Consult Note (Signed)
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   HPI/Patient Profile: 76 y.o. female  with past medical history of fallopian tube cancer treated 3 years ago, HTN, HLD admitted on 12/08/2016 with constant incontinent black stools x 3-4 days. Also complains of weight loss, swelling in abd and legs, anxiety r/t trying to sell a home in [place] and having a husband with dementia. Unfortunately she was found to have extensive mass filling much of her lower abd and pelvis. Med onc and GYN onc has consulted and plan for biopsy 12/10/16. GI has been following for GIB had EGD 12/09/16 and blood loss thought to be from possible tumor infiltration of bowel but no source of bleeding evident on EGD. NGT has been placed for SBO from [place] and for relief of nausea.   Clinical Assessment and Goals of Care: I met today with [name] and friends of over 61 yrs at bedside. Her daughter is visiting with her grandchildren soon. She is exhausted and overwhelmed. Continues to have abd pain 6/10 and RN to bring her pain medication. Her other main complaint is her NGT. This has been irritating and painful for her since it has been placed.   My visit today was to meet [name] and introduce palliative care and our role in her care in support, symptom management, QOL, and helping her with a plan to move forward as she obtains all the information and answers she needs from the rest of her medical team. She just returned from biopsy. She is interested in discussing more with her daughter tomorrow. I have left my card and contact info for her daughter so that we may meet together tomorrow to discuss  Arthur further. Emotional support provided.   Primary Decision Maker PATIENT    SUMMARY OF RECOMMENDATIONS   - Planning family meeting to further assess GOC and options  Code Status/Advance Care Planning:  DNR   Symptom Management:   Continue NGT for decompression  Abd pain: Continue morphine 2 mg solution every 4 hours prn  No current nausea with NGT  Palliative Prophylaxis:   Delirium Protocol, Frequent Pain Assessment, Oral Care and Turn Reposition   Psycho-social/Spiritual:   Desire for further Chaplaincy support:yes  Additional Recommendations: Caregiving  Support/Resources, Education on Hospice and Grief/Bereavement Support  Prognosis:   Unable to determine at this time. Prognosis very poor with extensive cancer.   Discharge Planning: To Be Determined      Primary Diagnoses: Present on Admission: . (Resolved) GI bleed . Fallopian tube cancer, carcinoma ([place])   I have reviewed the medical record, interviewed the patient and family, and examined the patient. The following aspects are pertinent.  Past Medical History:  Diagnosis Date  . Hypercholesterolemia   . Hypertension   . Ovarian cancer ([place])   .  Pelvic mass    Social History   Social History  . Marital status: Married    Spouse name: N/A  . Number of children: N/A  . Years of education: N/A   Social History Main Topics  . Smoking status: Never Smoker  . Smokeless tobacco: Never Used  . Alcohol use 4.2 oz/week    7 Glasses of wine per week     Comment: " Glass of wine per night & a cocktail"  . Drug use: No  . Sexual activity: Not Currently   Other Topics Concern  . None   Social History Narrative  . None   Family History  Problem Relation Age of Onset  . Prostate cancer Brother   . Heart attack Father 46   Scheduled Meds: . feeding supplement  1 Container Oral TID BM  . potassium chloride  40 mEq Oral Once  . sodium chloride flush  3 mL Intravenous Q12H   Continuous  Infusions: . sodium chloride 125 mL/hr at 12/09/16 1618  . sodium chloride    . cefTRIAXone (ROCEPHIN)  IV Stopped (12/09/16 1806)   PRN Meds:.sodium chloride, acetaminophen **OR** acetaminophen, iopamidol, morphine injection, ondansetron (ZOFRAN) IV, phenol, sodium chloride flush Allergies  Allergen Reactions  . 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   . Latex Rash   Review of Systems  Constitutional: Positive for activity change, appetite change, fatigue and unexpected weight change.  Gastrointestinal: Positive for abdominal distention and abdominal pain.  Neurological: Positive for weakness.    Physical Exam  Constitutional: She is oriented to person, place, and time. She appears well-developed.  HENT:  Head: Normocephalic and atraumatic.  Cardiovascular: Normal rate and regular rhythm.   Pulmonary/Chest: Effort normal. No accessory muscle usage. No tachypnea. No respiratory distress.  Abdominal: She exhibits distension. There is tenderness.  Firm  Neurological: She is alert and oriented to person, place, and time.  Nursing note and vitals reviewed.   Vital Signs: BP (!) 114/50 (BP Location: Right Arm)   Pulse 87   Temp 98.6 F (37 C) (Oral)   Resp 20   Ht '5\' 3"'$  (1.6 m)   Wt 76.2 kg (167 lb 15.9 oz)   SpO2 94%   BMI 29.76 kg/m  Pain Assessment: 0-10   Pain Score: Asleep   SpO2: SpO2: 94 % O2 Device:SpO2: 94 % O2 Flow Rate: .O2 Flow Rate (L/min): 2 L/min  IO: Intake/output summary:  Intake/Output Summary (Last 24 hours) at 12/10/16 1300 Last data filed at 12/10/16 1229  Gross per 24 hour  Intake          2511.92 ml  Output               27 ml  Net          2484.92 ml    LBM: Last BM Date: 12/09/16 Baseline Weight: Weight: 72.6 kg (160 lb) Most recent weight: Weight: 76.2 kg (167 lb 15.9 oz)     Palliative Assessment/Data: 20%      Time Total: 72mn  Greater than 50%  of this time was spent counseling and  coordinating care related to the above assessment and plan.  Signed by: AVinie Sill NP Palliative Medicine Team Pager # [phone](M-F 8a-5p) Team Phone # [phone](Nights/Weekends)

## 2016-12-10 NOTE — Procedures (Signed)
CT core bx pelvic mass 18g x multiple to surg path No complication No blood loss. See complete dictation in [facility].

## 2016-12-10 NOTE — Progress Notes (Signed)
PROGRESS NOTE    Joanne Copeland  PJA:[id number] DOB: January 30, 1941 DOA: 12/08/2016 PCP: Myriam Jacobson, MD    Brief Narrative:  76 year old female history of fallopian tube cancer treated 3 years ago, hypertension, hyperlipidemia who presented with dark stools. Patient found to have a hemoglobin of 6.9. Patient was admitted for GI bleed and possible ascites. Ultrasound was done showing a huge pelvic mass. GI was consulted for EGD. Patient also now has possible hydronephrosis, small bowel obstruction. Gynecology oncology, oncology, general surgery, urology consulted.  Assessment & Plan:   Principal Problem:   Melena Active Problems:   Fallopian tube cancer, carcinoma (HCC)   Hypokalemia   Acute blood loss anemia   Thrombocytosis (HCC)   Serum total bilirubin elevated   High anion gap metabolic acidosis   Prolonged QT interval   Ascites   Pedal edema   Abdominal pain   Palliative care encounter  Pelvic Mass -With history of fallopian tube cancer 3 years ago -Patient noted to have marked distention on physical exam -Abdominal ultrasound showed large solid pelvic mass with maximum diameter of 28 cm, patient with bilateral moderate hydronephrosis, right angiomyolipoma  -Oncology, Dr. Alvy Bimler. Patient seen and case discussed with Dr. Alvy Bimler and Dr. Lucia Gaskins. Little to offer per Dr. Lucia Gaskins. Per Dr. Alvy Bimler, not candidate for chemo -Recs by Gyn Onc reviewed. Have requested IR consult for biopsy per recommendations.  GI bleed/acute blood loss anemia -Hemoglobin on admission 6.9 -Noted to be FOBT positive -Patient received 2 units PRBC, hemoglobin currently 9.1 -Gastroenterology consult and appreciated, pt underwent EGD 4/24. No significant lesions in upper GI tract -Will repeat CBC in AM  Small bowel obstruction with nausea/vomiting -Abdominal x-ray showed small bowel dilatation with multiple air-fluid levels concerning for small bowel obstruction versus enteritis -Remains with  NG tube in place -Likely due to her pelvic mass  Acute kidney injury/bilateral hydronephrosis -Likely secondary to pelvic mass -Baseline creatinine 1, upon admission, creatinine 1.63 -Urology consulted and appreciated -Discussed case with Dr. Tresa Moore. No plan for neph tubes or stents -Recheck BMP in AM  Hypokalemia -Replaced - Check bmet in AM  Fever/Leukocytosis (SIRS vs Sepsis) -Likely secondary to the above vs SBP -Patient no longer febrile however can do to have leukocytosis -Blood culture showed no growth to date -Continue ceftriaxone -repeat cbc in AM  High anion gap metabolic acidosis -Lactic acid 1.4 -Suspect due to SBO - Stable  Thrombocytosis -Resolved -repeat cbc  Pedal edema -Possibly related to the above -Echocardiogram was ordered  Prolong QT  -Likely due to hypokalemia, continue telemetry monitoring -Stable  Hypoalbuminemia/weight loss -Suspect due to pelvic mass -Currently nothing by mouth due to SBO -Tolerating ice chips currently  DVT prophylaxis: Lovenox subQ Code Status: DNR Family Communication: Pt in room, family at bedside Disposition Plan: Uncertain at this time  Consultants:   Urology  IR  Oncology  Gyn Onc  GI  General Surgery  Procedures:   EGD 4/24  IR guided biopsy of mass 4/25  Antimicrobials: Anti-infectives    Start     Dose/Rate Route Frequency Ordered Stop   12/08/16 1700  cefTRIAXone (ROCEPHIN) 1 g in dextrose 5 % 50 mL IVPB     1 g 100 mL/hr over 30 Minutes Intravenous Every 24 hours 12/08/16 1606         Subjective: No complaints at present  Objective: Vitals:   12/10/16 1536 12/10/16 1536 12/10/16 1619 12/10/16 1803  BP: (!) 97/47 (!) 92/50 97/64 (!) 100/50  Pulse: 90 90 87  86  Resp: 20 18 18 18   Temp:   98.4 F (36.9 C) 98.5 F (36.9 C)  TempSrc:   Oral Oral  SpO2: 94% 95% 100% 98%  Weight:      Height:        Intake/Output Summary (Last 24 hours) at 12/10/16 1830 Last data  filed at 12/10/16 1700  Gross per 24 hour  Intake          3072.92 ml  Output               27 ml  Net          3045.92 ml   Filed Weights   12/08/16 1212 12/09/16 0500 12/10/16 0500  Weight: 72.6 kg (160 lb) 72.4 kg (159 lb 9.8 oz) 76.2 kg (167 lb 15.9 oz)    Examination:  General exam: Appears calm and comfortable  Respiratory system: Clear to auscultation. Respiratory effort normal. Cardiovascular system: S1 & S2 heard, RRR. Gastrointestinal system: Abdomen is nondistended, soft and nontender. No organomegaly or masses felt. Normal bowel sounds heard. Central nervous system: Alert and oriented. No focal neurological deficits. Extremities: Symmetric 5 x 5 power. Skin: No rashes, lesions Psychiatry: Judgement and insight appear normal. Mood & affect appropriate.   Data Reviewed: I have personally reviewed following labs and imaging studies  CBC:  Recent Labs Lab 12/08/16 1253 12/08/16 2330 12/09/16 0611 12/09/16 1208 12/10/16 0530  WBC 10.0 13.2* 12.5* 13.3* 12.6*  NEUTROABS 7.8*  --   --   --   --   HGB 6.9* 8.3* 9.7* 9.6* 9.1*  HCT 21.5* 25.7* 30.1* 29.3* 27.8*  MCV 82.7 82.4 83.4 83.0 83.2  PLT 419* 387 384 370 956   Basic Metabolic Panel:  Recent Labs Lab 12/08/16 1253 12/08/16 1549 12/08/16 2330 12/09/16 0611 12/10/16 0530  NA 136  --  135 136 138  K 2.6*  --  3.2* 3.5 3.7  CL 101  --  102 101 105  CO2 17*  --  14* 17* 18*  GLUCOSE 76  --  98 102* 92  BUN 25*  --  26* 27* 26*  CREATININE 1.63*  --  1.61* 1.60* 1.50*  CALCIUM 8.0*  --  8.3* 8.3* 8.1*  MG  --  1.4*  --  1.4*  --    GFR: Estimated Creatinine Clearance: 32.6 mL/min (A) (by C-G formula based on SCr of 1.5 mg/dL (H)). Liver Function Tests:  Recent Labs Lab 12/08/16 1253 12/09/16 0611 12/10/16 0530  AST 25 26 25   ALT 7* 8* 8*  ALKPHOS 42 50 53  BILITOT 1.3* 1.3* 1.1  PROT 5.4* 5.7* 5.4*  ALBUMIN 2.5* 2.5* 2.2*    Recent Labs Lab 12/08/16 1253  LIPASE 17    Recent  Labs Lab 12/08/16 1253  AMMONIA 16   Coagulation Profile:  Recent Labs Lab 12/08/16 1253  INR 1.11   Cardiac Enzymes:  Recent Labs Lab 12/08/16 1253  TROPONINI <0.03   BNP (last 3 results) No results for input(s): PROBNP in the last 8760 hours. HbA1C: No results for input(s): HGBA1C in the last 72 hours. CBG: No results for input(s): GLUCAP in the last 168 hours. Lipid Profile: No results for input(s): CHOL, HDL, LDLCALC, TRIG, CHOLHDL, LDLDIRECT in the last 72 hours. Thyroid Function Tests: No results for input(s): TSH, T4TOTAL, FREET4, T3FREE, THYROIDAB in the last 72 hours. Anemia Panel:  Recent Labs  12/08/16 1549  VITAMINB12 936*  FOLATE 11.5  FERRITIN 699*  TIBC 148*  IRON 8*  RETICCTPCT 2.1   Sepsis Labs:  Recent Labs Lab 12/08/16 1551  LATICACIDVEN 1.4    Recent Results (from the past 240 hour(s))  Culture, blood (Routine X 2) w Reflex to ID Panel     Status: Abnormal   Collection Time: 12/08/16  4:06 PM  Result Value Ref Range Status   Specimen Description LEFT ANTECUBITAL  Final   Special Requests   Final    BOTTLES DRAWN AEROBIC AND ANAEROBIC Blood Culture adequate volume   Culture  Setup Time   Final    GRAM POSITIVE COCCI IN CLUSTERS AEROBIC BOTTLE ONLY CRITICAL RESULT CALLED TO, READ BACK BY AND VERIFIED WITH: Melodye Ped Pharm.D. 17:10 12/09/16 ([email or web address])    Culture (A)  Final    STAPHYLOCOCCUS SPECIES (COAGULASE NEGATIVE) THE SIGNIFICANCE OF ISOLATING THIS ORGANISM FROM A SINGLE SET OF BLOOD CULTURES WHEN MULTIPLE SETS ARE DRAWN IS UNCERTAIN. PLEASE NOTIFY THE MICROBIOLOGY DEPARTMENT WITHIN ONE WEEK IF SPECIATION AND SENSITIVITIES ARE REQUIRED. Performed at [facility], [place] [address], [place], [place] [address]    Report Status 12/10/2016 FINAL  Final  Blood Culture ID Panel (Reflexed)     Status: Abnormal   Collection Time: 12/08/16  4:06 PM  Result Value Ref Range Status   Enterococcus species NOT DETECTED NOT DETECTED Final    Listeria monocytogenes NOT DETECTED NOT DETECTED Final   Staphylococcus species DETECTED (A) NOT DETECTED Final    Comment: Methicillin (oxacillin) susceptible coagulase negative staphylococcus. Possible blood culture contaminant (unless isolated from more than one blood culture draw or clinical case suggests pathogenicity). No antibiotic treatment is indicated for blood  culture contaminants. CRITICAL RESULT CALLED TO, READ BACK BY AND VERIFIED WITH: J. Scherrie November Pharm.D. 17:10 12/09/16 ([email or web address])    Staphylococcus aureus NOT DETECTED NOT DETECTED Final   Methicillin resistance NOT DETECTED NOT DETECTED Final   Streptococcus species NOT DETECTED NOT DETECTED Final   Streptococcus agalactiae NOT DETECTED NOT DETECTED Final   Streptococcus pneumoniae NOT DETECTED NOT DETECTED Final   Streptococcus pyogenes NOT DETECTED NOT DETECTED Final   Acinetobacter baumannii NOT DETECTED NOT DETECTED Final   Enterobacteriaceae species NOT DETECTED NOT DETECTED Final   Enterobacter cloacae complex NOT DETECTED NOT DETECTED Final   Escherichia coli NOT DETECTED NOT DETECTED Final   Klebsiella oxytoca NOT DETECTED NOT DETECTED Final   Klebsiella pneumoniae NOT DETECTED NOT DETECTED Final   Proteus species NOT DETECTED NOT DETECTED Final   Serratia marcescens NOT DETECTED NOT DETECTED Final   Haemophilus influenzae NOT DETECTED NOT DETECTED Final   Neisseria meningitidis NOT DETECTED NOT DETECTED Final   Pseudomonas aeruginosa NOT DETECTED NOT DETECTED Final   Candida albicans NOT DETECTED NOT DETECTED Final   Candida glabrata NOT DETECTED NOT DETECTED Final   Candida krusei NOT DETECTED NOT DETECTED Final   Candida parapsilosis NOT DETECTED NOT DETECTED Final   Candida tropicalis NOT DETECTED NOT DETECTED Final    Comment: Performed at [facility], [place] [address] [place], [place] [address]  Culture, blood (Routine X 2) w Reflex to ID Panel     Status: None (Preliminary result)   Collection Time:  12/08/16  4:10 PM  Result Value Ref Range Status   Specimen Description BLOOD RIGHT ANTECUBITAL  Final   Special Requests   Final    BOTTLES DRAWN AEROBIC AND ANAEROBIC Blood Culture adequate volume   Culture   Final    NO GROWTH 2 DAYS Performed at [facility], [place]  [address] [place], [place] [address]    Report Status PENDING  Incomplete  MRSA PCR Screening     Status: None   Collection Time: 12/08/16  9:49 PM  Result Value Ref Range Status   MRSA by PCR NEGATIVE NEGATIVE Final    Comment:        The GeneXpert MRSA Assay (FDA approved for NASAL specimens only), is one component of a comprehensive MRSA colonization surveillance program. It is not intended to diagnose MRSA infection nor to guide or monitor treatment for MRSA infections.      Radiology Studies: Ct Abdomen Pelvis Wo Contrast  Result Date: 12/09/2016 CLINICAL DATA:  76 year old female with a history of TAHBSO in 2015 with 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 Patient admitted with abdominal distention, bowel obstruction, bilateral hydronephrosis and large pelvic mass at sonography. EXAM: CT CHEST, ABDOMEN AND PELVIS WITHOUT CONTRAST TECHNIQUE: Multidetector CT imaging of the chest, abdomen and pelvis was performed following the standard protocol without IV contrast. COMPARISON:  Abdominal sonogram from earlier today. 06/05/2015 chest CT. 08/07/2014 CT abdomen/pelvis. FINDINGS: CT CHEST FINDINGS Cardiovascular: Normal heart size. Trace pericardial effusion/ thickening is not appreciably changed. Left anterior descending coronary atherosclerosis. Right internal jugular MediPort terminates in the middle third of the superior vena cava. Atherosclerotic nonaneurysmal thoracic aorta. Normal caliber pulmonary arteries. Mediastinum/Nodes: No discrete thyroid nodules. Enteric tube terminates in the body of the stomach. Oral contrast in the mid to lower thoracic esophagus suggests mild  gastroesophageal reflux. No pathologically enlarged axillary, mediastinal or gross hilar lymph nodes, noting limited sensitivity for the detection of hilar adenopathy on this noncontrast study. Lungs/Pleura: No pneumothorax. Trace dependent bilateral pleural effusions. Subsegmental compressive atelectasis in the dependent lower lobes bilaterally. Otherwise no acute consolidative airspace disease. No lung masses or significant pulmonary nodules in the aerated portions of the lungs. Musculoskeletal: No aggressive appearing focal osseous lesions. Mild thoracic spondylosis. CT ABDOMEN PELVIS FINDINGS Hepatobiliary: Normal liver with no liver mass. Mildly distended gallbladder. No radiopaque cholelithiasis. No definite gallbladder wall thickening. No pericholecystic fluid. No biliary ductal dilatation. Pancreas: Normal, with no mass or duct dilation. Spleen: Normal size. No mass. Adrenals/Urinary Tract: Normal adrenals. Moderate to marked bilateral hydroureteronephrosis to the level of the lower lumbar ureters bilaterally. No urolithiasis. Simple 1.6 cm renal cyst in the anterior interpolar right kidney. Mixed density 2.5 x 2.3 cm renal cortical mass in the posterior upper right kidney (series 2/ image 58) with significant internal regions of macroscopic fat density, increased from 1.8 x 1.6 cm, compatible with a growing renal angiomyolipoma. Simple partially exophytic 1.5 cm renal cyst in the lateral lower left kidney. No additional contour deforming renal lesions. Compressed, nearly collapsed and grossly normal bladder. Stomach/Bowel: Collapsed and grossly normal stomach noting enteric tube tip in the distal body of the stomach. There is a very large predominantly solid 30.9 x 20.2 x 23.9 cm mass filling much of the lower abdomen and pelvis (series 2/ image 82), which is new since 08/07/2014, which is continuous inferiorly with the vaginal cuff, which contains heterogeneous internal density including significant  central regions of gas. This large mass is intimately associated with multiple small bowel loops and with the sigmoid colon. There is a suggestion of direct fistulization of the mass with the mid sigmoid lumen (series 5/images 59-61). The small bowel is decompressed with no significantly dilated small bowel loops. Oral contrast extends only to the mid small bowel. Distal small bowel loops are collapsed. Large bowel is relatively collapsed with mild  stool throughout the large bowel. No regions of small or large bowel wall thickening. Vascular/Lymphatic: Atherosclerotic nonaneurysmal abdominal aorta. No pathologically enlarged lymph nodes in the abdomen or pelvis. Reproductive: Status post hysterectomy and bilateral oophorectomy. Other: No free intraperitoneal air. No focal fluid collections. There is compression of the common iliac veins bilaterally by the mass with anasarca throughout the pelvis. Musculoskeletal: No aggressive appearing focal osseous lesions. Mild lumbar spondylosis. IMPRESSION: 1. Very large predominantly solid 30.9 x 20.2 x 23.9 cm mass filling much of the lower abdomen and pelvis, continuous inferiorly with the vaginal cuff, intimately associated with multiple small bowel loops and with the sigmoid colon, with probable direct fistulization of the mass with the mid sigmoid lumen, with large amount of gas centrally within the mass. Findings are compatible with recurrent neoplasm. 2. No evidence of metastatic disease in the chest. No evidence of nodal, visceral or osseous metastatic disease. 3. Bilateral hydroureteronephrosis due to distal lumbar ureteral obstruction by the mass. 4. Trace dependent bilateral pleural effusions. Compression of the common iliac veins by the mass with pelvic anasarca. 5. 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 6. Aortic atherosclerosis.  One vessel coronary atherosclerosis. Electronically Signed   By: Ilona Sorrel M.D.   On:  12/09/2016 20:59   Ct Chest Wo Contrast  Result Date: 12/09/2016 CLINICAL DATA:  76 year old female with a history of TAHBSO in 2015 with 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 Patient admitted with abdominal distention, bowel obstruction, bilateral hydronephrosis and large pelvic mass at sonography. EXAM: CT CHEST, ABDOMEN AND PELVIS WITHOUT CONTRAST TECHNIQUE: Multidetector CT imaging of the chest, abdomen and pelvis was performed following the standard protocol without IV contrast. COMPARISON:  Abdominal sonogram from earlier today. 06/05/2015 chest CT. 08/07/2014 CT abdomen/pelvis. FINDINGS: CT CHEST FINDINGS Cardiovascular: Normal heart size. Trace pericardial effusion/ thickening is not appreciably changed. Left anterior descending coronary atherosclerosis. Right internal jugular MediPort terminates in the middle third of the superior vena cava. Atherosclerotic nonaneurysmal thoracic aorta. Normal caliber pulmonary arteries. Mediastinum/Nodes: No discrete thyroid nodules. Enteric tube terminates in the body of the stomach. Oral contrast in the mid to lower thoracic esophagus suggests mild gastroesophageal reflux. No pathologically enlarged axillary, mediastinal or gross hilar lymph nodes, noting limited sensitivity for the detection of hilar adenopathy on this noncontrast study. Lungs/Pleura: No pneumothorax. Trace dependent bilateral pleural effusions. Subsegmental compressive atelectasis in the dependent lower lobes bilaterally. Otherwise no acute consolidative airspace disease. No lung masses or significant pulmonary nodules in the aerated portions of the lungs. Musculoskeletal: No aggressive appearing focal osseous lesions. Mild thoracic spondylosis. CT ABDOMEN PELVIS FINDINGS Hepatobiliary: Normal liver with no liver mass. Mildly distended gallbladder. No radiopaque cholelithiasis. No definite gallbladder wall thickening. No pericholecystic fluid. No biliary ductal dilatation.  Pancreas: Normal, with no mass or duct dilation. Spleen: Normal size. No mass. Adrenals/Urinary Tract: Normal adrenals. Moderate to marked bilateral hydroureteronephrosis to the level of the lower lumbar ureters bilaterally. No urolithiasis. Simple 1.6 cm renal cyst in the anterior interpolar right kidney. Mixed density 2.5 x 2.3 cm renal cortical mass in the posterior upper right kidney (series 2/ image 58) with significant internal regions of macroscopic fat density, increased from 1.8 x 1.6 cm, compatible with a growing renal angiomyolipoma. Simple partially exophytic 1.5 cm renal cyst in the lateral lower left kidney. No additional contour deforming renal lesions. Compressed, nearly collapsed and grossly normal bladder. Stomach/Bowel: Collapsed and grossly normal stomach noting enteric tube tip in the  distal body of the stomach. There is a very large predominantly solid 30.9 x 20.2 x 23.9 cm mass filling much of the lower abdomen and pelvis (series 2/ image 82), which is new since 08/07/2014, which is continuous inferiorly with the vaginal cuff, which contains heterogeneous internal density including significant central regions of gas. This large mass is intimately associated with multiple small bowel loops and with the sigmoid colon. There is a suggestion of direct fistulization of the mass with the mid sigmoid lumen (series 5/images 59-61). The small bowel is decompressed with no significantly dilated small bowel loops. Oral contrast extends only to the mid small bowel. Distal small bowel loops are collapsed. Large bowel is relatively collapsed with mild stool throughout the large bowel. No regions of small or large bowel wall thickening. Vascular/Lymphatic: Atherosclerotic nonaneurysmal abdominal aorta. No pathologically enlarged lymph nodes in the abdomen or pelvis. Reproductive: Status post hysterectomy and bilateral oophorectomy. Other: No free intraperitoneal air. No focal fluid collections. There is  compression of the common iliac veins bilaterally by the mass with anasarca throughout the pelvis. Musculoskeletal: No aggressive appearing focal osseous lesions. Mild lumbar spondylosis. IMPRESSION: 1. Very large predominantly solid 30.9 x 20.2 x 23.9 cm mass filling much of the lower abdomen and pelvis, continuous inferiorly with the vaginal cuff, intimately associated with multiple small bowel loops and with the sigmoid colon, with probable direct fistulization of the mass with the mid sigmoid lumen, with large amount of gas centrally within the mass. Findings are compatible with recurrent neoplasm. 2. No evidence of metastatic disease in the chest. No evidence of nodal, visceral or osseous metastatic disease. 3. Bilateral hydroureteronephrosis due to distal lumbar ureteral obstruction by the mass. 4. Trace dependent bilateral pleural effusions. Compression of the common iliac veins by the mass with pelvic anasarca. 5. 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 6. Aortic atherosclerosis.  One vessel coronary atherosclerosis. Electronically Signed   By: Ilona Sorrel M.D.   On: 12/09/2016 20:59   US Abdomen Complete  Result Date: 12/09/2016 CLINICAL DATA:  Abdominal distention.  Ovarian cancer . EXAM: ABDOMEN ULTRASOUND COMPLETE COMPARISON:  [facility] 12/08/2016.  CT 08/07/2014 . FINDINGS: Gallbladder: No gallstones or wall thickening visualized. No sonographic Murphy sign noted by sonographer. Common bile duct: Diameter: 6.9 mm Liver: No focal lesion identified. Within normal limits in parenchymal echogenicity. IVC: No abnormality visualized. Pancreas: Not visualized. Spleen: Size and appearance within normal limits. Right Kidney: Length: 10.4 cm. Echogenicity within normal limits. Moderate hydronephrosis noted . 3.2 x 2.6 x 2.7 cm solid hyperechoic lesion right upper renal pole . The lesion has increased in size from 1.9 cm. Although a renal cell carcinoma cannot be excluded,  this most likely represents a enlarging angiomyolipoma given prior CT finding. 1.8 x 1.9 x 2.4 cm simple cyst lower pole right kidney again noted. Left Kidney: Length: 9.8 cm. Echogenicity within normal limits. Moderate hydronephrosis noted. 1.1 x 0.7 x 0.9 cm simple cyst. Abdominal aorta: No aneurysm visualized. Other findings: Large solid pelvic mass measuring 28.0 x 17.0 x 24.3 cm IMPRESSION: 1. Large solid pelvic mass with maximum diameter of 28.0 cm noted. 2. Bilateral moderate hydronephrosis. 2 3.2 cm hyperechoic lesion right upper renal pole. 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 Electronically Signed   By: Marcello Moores  Register   On: 12/09/2016 11:39   Ct Biopsy  Result Date: 12/10/2016 CLINICAL DATA:  History of fallopian tube cancer.  Large pelvic mass. EXAM: CT GUIDED CORE BIOPSY OF PELVIC MASS ANESTHESIA/SEDATION: Intravenous Fentanyl and Versed were administered as conscious sedation during continuous monitoring of the patient's level of consciousness and physiological / cardiorespiratory status by the radiology RN, with a total moderate sedation time of 6 minutes. PROCEDURE: The procedure risks, benefits, and alternatives were explained to the patient. Questions regarding the procedure were encouraged and answered. The patient understands and consents to the procedure. Select axial scans through the pelvis were obtained. The mass was localized and an appropriate skin entry site was determined and marked. The operative field was prepped with chlorhexidinein a sterile fashion, and a sterile drape was applied covering the operative field. A sterile gown and sterile gloves were used for the procedure. Local anesthesia was provided with 1% Lidocaine. Under CT fluoroscopic guidance, a 17 gauge trocar needle was advanced to the margin of the lesion. Once needle tip position was confirmed, coaxial 18-gauge core biopsy samples were obtained, submitted  in formalin to surgical pathology. The guide needle was removed. Postprocedure scans show no hemorrhage or other apparent complication. The patient tolerated the procedure well. COMPLICATIONS: None immediate FINDINGS: Large mass occupying most of the pelvis with central low-attenuation regions and gas, as noted on prior CT. Representative core biopsy samples of the peripheral right lower aspect of the lesion obtained without complication. IMPRESSION: 1. Technically successful CT-guided core biopsy,  pelvic mass Electronically Signed   By: Lucrezia Europe M.D.   On: 12/10/2016 16:36   Dg Abd Portable 1v  Result Date: 12/08/2016 CLINICAL DATA:  Nasogastric tube placement.  Initial encounter. EXAM: PORTABLE ABDOMEN - 1 VIEW COMPARISON:  Abdominal radiograph performed earlier today at 2:07 p.m. FINDINGS: The patient's enteric tube is noted coiled at the body of the stomach, ending at the gastroesophageal junction. The stomach is relatively decompressed. There appears to be displacement of bowel by a large lower abdominal mass, likely reflecting the patient's known ovarian cancer. No free intra-abdominal air is seen. The visualized lung bases are clear. Mild degenerative change is noted at the lower lumbar spine. IMPRESSION: 1. Enteric tube noted coiled at the body of the stomach, ending at the gastroesophageal junction. The stomach is relatively decompressed. 2. Displacement of bowel by a large lower abdominal mass, likely reflecting the patient's known ovarian cancer. No free intra-abdominal air seen. Electronically Signed   By: Garald Balding M.D.   On: 12/08/2016 19:44    Scheduled Meds: . feeding supplement  1 Container Oral TID BM  . fentaNYL      . midazolam      . potassium chloride  40 mEq Oral Once  . sodium chloride flush  3 mL Intravenous Q12H   Continuous Infusions: . sodium chloride 125 mL/hr at 12/09/16 1618  . sodium chloride    . cefTRIAXone (ROCEPHIN)  IV Stopped (12/10/16 1651)     LOS:  2 days   Dim Meisinger, Orpah Melter, MD [facility] Pager [phone]  If 7PM-7AM, please contact night-coverage [email or web address] Password [facility] 12/10/2016, 6:30 PM

## 2016-12-10 NOTE — Consult Note (Signed)
[place] CONSULT NOTE  Patient Care Team: Lorene Dy, MD as PCP - General (Internal Medicine) Alexis Frock, MD as Consulting Physician (Urology)  CHIEF COMPLAINTS/PURPOSE OF CONSULTATION:  Recurrent cancer  HISTORY OF PRESENTING ILLNESS:  Joanne Copeland 76 y.o. female is seen at the request by the hospitalist to evaluate this patient with history of fallopian tube cancer This patient was admitted to the hospital on 12/08/2016 after presentation with passage of black stool. This has been a constant issue recently.  The patient takes NSAID She also have noticed significant weight loss but abdominal bloating over the past 2 months. She denies significant changes in her bowel habit She had some nausea, with subsequent NG tube placement which helped decompress her stomach and relieve some of the nausea With melena and anemia, GI was consulted.  EGD showed nonerosive changes.  CT scan showed evidence of large pelvic tumor, likely secondary to recurrence of fallopian cancer. The patient have diagnosis of cancer in 2015, status post surgery and chemotherapy.  She was lost to follow-up since 2017 I review her records extensively and summarized as follows: Oncology History   LEFT     Fallopian tube cancer, carcinoma ([place])   01/02/2014 Initial Diagnosis    Patient had acute abdominal pain on 01-02-14, seen in ED and admitted to [facility], [place].       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      02/28/2014 - 07/03/2014 Chemotherapy    She received 6 cycles of  carbo/taxol       04/04/2014 Procedure    Successful placement of a right IJ approach Power Port with ultrasound and fluoroscopic guidance. The catheter is ready for use.      04/17/2014 Tumor Marker    Patient's tumor was tested for the following markers: CA125 Results of the tumor marker test revealed 8      08/07/2014 Imaging    Ct abdomen: 1. Status post total abdominal hysterectomy and bilateral salpingo oophorectomy. No findings to suggest local recurrence of disease or metastatic disease in the abdomen or pelvis. 2. 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 3. Incompletely visualized pulmonary nodule measuring at least 5 mm in the left lower lobe. This is new compared to prior study 09/21/2010, but is highly nonspecific. If there is clinical concern for metastatic disease to the chest, further evaluation with noncontrast chest CT would be recommended at this time to establish a baseline for future followup examinations. 4. Atherosclerosis.      08/07/2014 Tumor Marker    Patient's tumor was tested for the following markers: CA125 Results of the tumor marker test revealed 4      08/14/2014 Imaging    Ct chest: The incompletely visualized 5 mm nodule seen in the left lower lobe on the previous study has resolved in the interval, making it compatible with atelectasis or a small focus of infection/inflammation. No worrisome or suspicious finding in either lung on today's chest  CT.      06/05/2015 Imaging    Ct chest: 1. No active cardiopulmonary abnormalities and no suspicious pulmonary nodules identified. 2. Aortic atherosclerosis.      12/09/2016 Imaging    Ct scan chest, abdomen and pelvis: 1. Very large predominantly solid 30.9 x 20.2 x 23.9 cm mass filling much of the lower abdomen and pelvis, continuous inferiorly with the vaginal cuff, intimately associated with multiple small bowel  loops and with the sigmoid colon, with probable direct fistulization of the mass with the mid sigmoid lumen, with large amount of gas centrally within the mass. Findings are compatible with recurrent neoplasm. 2. No evidence of metastatic disease in the chest. No evidence of nodal, visceral or osseous metastatic disease. 3. Bilateral hydroureteronephrosis due to distal lumbar ureteral obstruction by the mass. 4. Trace dependent bilateral pleural effusions. Compression of the common iliac veins by the mass with pelvic anasarca. 5. 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 6. Aortic atherosclerosis.  One vessel coronary atherosclerosis.      12/09/2016 Imaging    US abdomen: Large solid pelvic mass with maximum diameter of 28.0 cm noted. 2. Bilateral moderate hydronephrosis. 3.2 cm hyperechoic lesion right upper renal pole. 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      12/09/2016 Tumor Marker    Patient's tumor was tested for the following markers: CA125 Results of the tumor marker test revealed 316.7      12/09/2016 Procedure    She had EGD for evaluation of melena:                           - Normal esophagus.                           - Widely patent and mild Schatzki ring.                           - 2 cm hiatal hernia.                           - Non-bleeding erosive gastropathy.                           - Normal examined duodenum.                           - No specimens collected.                           - No evident source of melenic, heme positive stool or severe anemia seen on this exam.      She has mild abdominal discomfort and significant abdominal distention. Her appetite is poor. Denies chest pain or shortness of breath  MEDICAL HISTORY:  Past Medical History:  Diagnosis Date  . Hypercholesterolemia   . Hypertension   . Ovarian cancer ([place])   . Pelvic mass     SURGICAL  HISTORY: Past Surgical History:  Procedure Laterality Date  . omentectomy PPLND  01/05/2014  . radical tumor debulking  01/05/2014  . TOTAL ABDOMINAL HYSTERECTOMY W/ BILATERAL SALPINGOOPHORECTOMY  01/05/2014    SOCIAL HISTORY: Social History   Social History  .  Marital status: Married    Spouse name: N/A  . Number of children: N/A  . Years of education: N/A   Occupational History  . Not on file.   Social History Main Topics  . Smoking status: Never Smoker  . Smokeless tobacco: Never Used  . Alcohol use 4.2 oz/week    7 Glasses of wine per week     Comment: " Glass of wine per night & a cocktail"  . Drug use: No  . Sexual activity: Not Currently   Other Topics Concern  . Not on file   Social History Narrative  . No narrative on file    FAMILY HISTORY: Family History  Problem Relation Age of Onset  . Prostate cancer Brother   . Heart attack Father 63    ALLERGIES:  is allergic to food and latex.  MEDICATIONS:  Current Facility-Administered Medications  Medication Dose Route Frequency Provider Last Rate Last Dose  . 0.9 %  sodium chloride infusion   Intravenous Continuous Isla Pence, MD 125 mL/hr at 12/09/16 1618    . 0.9 %  sodium chloride infusion  250 mL Intravenous PRN Debbe Odea, MD      . acetaminophen (TYLENOL) tablet 650 mg  650 mg Oral Q6H PRN Debbe Odea, MD       Or  . acetaminophen (TYLENOL) suppository 650 mg  650 mg Rectal Q6H PRN Debbe Odea, MD      . cefTRIAXone (ROCEPHIN) 1 g in dextrose 5 % 50 mL IVPB  1 g Intravenous Q24H Debbe Odea, MD   Stopped at 12/09/16 1806  . feeding supplement (BOOST / RESOURCE BREEZE) liquid 1 Container  1 Container Oral TID BM Debbe Odea, MD   1 Container at 12/09/16 2000  . iopamidol (ISOVUE-300) 61 % injection 15 mL  15 mL Oral BID PRN Maryann Mikhail, DO      . Morphine Sulfate (PF) SOLN 2 mg  2 mg Intravenous Q4H PRN Debbe Odea, MD   2 mg at 12/10/16 0803  . ondansetron (ZOFRAN) injection 4-8 mg  4-8  mg Intravenous Q8H PRN Ronald Lobo, MD   8 mg at 12/08/16 1915  . phenol (CHLORASEPTIC) mouth spray 1 spray  1 spray Mouth/Throat PRN Maryann Mikhail, DO      . potassium chloride SA (K-DUR,KLOR-CON) CR tablet 40 mEq  40 mEq Oral Once Debbe Odea, MD      . sodium chloride flush (NS) 0.9 % injection 3 mL  3 mL Intravenous Q12H Debbe Odea, MD   3 mL at 12/09/16 2200  . sodium chloride flush (NS) 0.9 % injection 3 mL  3 mL Intravenous PRN Debbe Odea, MD        REVIEW OF SYSTEMS:   Constitutional: Denies fevers, chills or abnormal night sweats Eyes: Denies blurriness of vision, double vision or watery eyes Ears, nose, mouth, throat, and face: Denies mucositis or sore throat Respiratory: Denies cough, dyspnea or wheezes Cardiovascular: Denies palpitation, chest discomfort  Skin: Denies abnormal skin rashes Lymphatics: Denies new lymphadenopathy or easy bruising Neurological:Denies numbness, tingling or new weaknesses Behavioral/Psych: Mood is stable, no new changes  All other systems were reviewed with the patient and are negative.  PHYSICAL EXAMINATION: ECOG PERFORMANCE STATUS: 2 - Symptomatic, <50% confined to bed  Vitals:   12/09/16 2039 12/10/16 0449  BP: (!) 121/56 (!) 114/50  Pulse: 89 87  Resp: (!) 22 20  Temp: 98.1 F (36.7 C) 98.6 F (37 C)   Filed Weights   12/08/16  1212 12/09/16 0500 12/10/16 0500  Weight: 160 lb (72.6 kg) 159 lb 9.8 oz (72.4 kg) 167 lb 15.9 oz (76.2 kg)    GENERAL:alert, no distress and comfortable.  She looks thin with gross abdominal distention SKIN: skin color, texture, turgor are normal, no rashes or significant lesions EYES: normal, conjunctiva are pink and non-injected, sclera clear OROPHARYNX:no exudate, no erythema and lips, buccal mucosa, and tongue normal  NECK: supple, thyroid normal size, non-tender, without nodularity LYMPH:  no palpable lymphadenopathy in the cervical, axillary or inguinal LUNGS: clear to auscultation and  percussion with normal breathing effort HEART: regular rate & rhythm and no murmurs with moderate bilateral lower extremity edema ABDOMEN:abdomen soft, grossly distended with tumor, mild tender to palpation but no rebound or guarding Musculoskeletal:no cyanosis of digits and no clubbing  PSYCH: alert & oriented x 3 with fluent speech NEURO: no focal motor/sensory deficits  LABORATORY DATA:  I have reviewed the data as listed Lab Results  Component Value Date   WBC 12.6 (H) 12/10/2016   HGB 9.1 (L) 12/10/2016   HCT 27.8 (L) 12/10/2016   MCV 83.2 12/10/2016   PLT 370 12/10/2016    Recent Labs  12/08/16 1253 12/08/16 2330 12/09/16 0611 12/10/16 0530  NA 136 135 136 138  K 2.6* 3.2* 3.5 3.7  CL 101 102 101 105  CO2 17* 14* 17* 18*  GLUCOSE 76 98 102* 92  BUN 25* 26* 27* 26*  CREATININE 1.63* 1.61* 1.60* 1.50*  CALCIUM 8.0* 8.3* 8.3* 8.1*  GFRNONAA 30* 31* 31* 33*  GFRAA 35* 36* 36* 39*  PROT 5.4*  --  5.7* 5.4*  ALBUMIN 2.5*  --  2.5* 2.2*  AST 25  --  26 25  ALT 7*  --  8* 8*  ALKPHOS 42  --  50 53  BILITOT 1.3*  --  1.3* 1.1    RADIOGRAPHIC STUDIES: I have personally reviewed the radiological images as listed and agreed with the findings in the report. Ct Abdomen Pelvis Wo Contrast  Result Date: 12/09/2016 CLINICAL DATA:  76 year old female with a history of TAHBSO in 2015 with 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 Patient admitted with abdominal distention, bowel obstruction, bilateral hydronephrosis and large pelvic mass at sonography. EXAM: CT CHEST, ABDOMEN AND PELVIS WITHOUT CONTRAST TECHNIQUE: Multidetector CT imaging of the chest, abdomen and pelvis was performed following the standard protocol without IV contrast. COMPARISON:  Abdominal sonogram from earlier today. 06/05/2015 chest CT. 08/07/2014 CT abdomen/pelvis. FINDINGS: CT CHEST FINDINGS Cardiovascular: Normal heart size. Trace pericardial effusion/ thickening is not appreciably  changed. Left anterior descending coronary atherosclerosis. Right internal jugular MediPort terminates in the middle third of the superior vena cava. Atherosclerotic nonaneurysmal thoracic aorta. Normal caliber pulmonary arteries. Mediastinum/Nodes: No discrete thyroid nodules. Enteric tube terminates in the body of the stomach. Oral contrast in the mid to lower thoracic esophagus suggests mild gastroesophageal reflux. No pathologically enlarged axillary, mediastinal or gross hilar lymph nodes, noting limited sensitivity for the detection of hilar adenopathy on this noncontrast study. Lungs/Pleura: No pneumothorax. Trace dependent bilateral pleural effusions. Subsegmental compressive atelectasis in the dependent lower lobes bilaterally. Otherwise no acute consolidative airspace disease. No lung masses or significant pulmonary nodules in the aerated portions of the lungs. Musculoskeletal: No aggressive appearing focal osseous lesions. Mild thoracic spondylosis. CT ABDOMEN PELVIS FINDINGS Hepatobiliary: Normal liver with no liver mass. Mildly distended gallbladder. No radiopaque cholelithiasis. No definite gallbladder wall thickening. No pericholecystic fluid. No biliary ductal dilatation. Pancreas: Normal, with  no mass or duct dilation. Spleen: Normal size. No mass. Adrenals/Urinary Tract: Normal adrenals. Moderate to marked bilateral hydroureteronephrosis to the level of the lower lumbar ureters bilaterally. No urolithiasis. Simple 1.6 cm renal cyst in the anterior interpolar right kidney. Mixed density 2.5 x 2.3 cm renal cortical mass in the posterior upper right kidney (series 2/ image 58) with significant internal regions of macroscopic fat density, increased from 1.8 x 1.6 cm, compatible with a growing renal angiomyolipoma. Simple partially exophytic 1.5 cm renal cyst in the lateral lower left kidney. No additional contour deforming renal lesions. Compressed, nearly collapsed and grossly normal bladder.  Stomach/Bowel: Collapsed and grossly normal stomach noting enteric tube tip in the distal body of the stomach. There is a very large predominantly solid 30.9 x 20.2 x 23.9 cm mass filling much of the lower abdomen and pelvis (series 2/ image 82), which is new since 08/07/2014, which is continuous inferiorly with the vaginal cuff, which contains heterogeneous internal density including significant central regions of gas. This large mass is intimately associated with multiple small bowel loops and with the sigmoid colon. There is a suggestion of direct fistulization of the mass with the mid sigmoid lumen (series 5/images 59-61). The small bowel is decompressed with no significantly dilated small bowel loops. Oral contrast extends only to the mid small bowel. Distal small bowel loops are collapsed. Large bowel is relatively collapsed with mild stool throughout the large bowel. No regions of small or large bowel wall thickening. Vascular/Lymphatic: Atherosclerotic nonaneurysmal abdominal aorta. No pathologically enlarged lymph nodes in the abdomen or pelvis. Reproductive: Status post hysterectomy and bilateral oophorectomy. Other: No free intraperitoneal air. No focal fluid collections. There is compression of the common iliac veins bilaterally by the mass with anasarca throughout the pelvis. Musculoskeletal: No aggressive appearing focal osseous lesions. Mild lumbar spondylosis. IMPRESSION: 1. Very large predominantly solid 30.9 x 20.2 x 23.9 cm mass filling much of the lower abdomen and pelvis, continuous inferiorly with the vaginal cuff, intimately associated with multiple small bowel loops and with the sigmoid colon, with probable direct fistulization of the mass with the mid sigmoid lumen, with large amount of gas centrally within the mass. Findings are compatible with recurrent neoplasm. 2. No evidence of metastatic disease in the chest. No evidence of nodal, visceral or osseous metastatic disease. 3. Bilateral  hydroureteronephrosis due to distal lumbar ureteral obstruction by the mass. 4. Trace dependent bilateral pleural effusions. Compression of the common iliac veins by the mass with pelvic anasarca. 5. 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 6. Aortic atherosclerosis.  One vessel coronary atherosclerosis. Electronically Signed   By: Ilona Sorrel M.D.   On: 12/09/2016 20:59   Ct Chest Wo Contrast  Result Date: 12/09/2016 CLINICAL DATA:  76 year old female with a history of TAHBSO in 2015 with 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 Patient admitted with abdominal distention, bowel obstruction, bilateral hydronephrosis and large pelvic mass at sonography. EXAM: CT CHEST, ABDOMEN AND PELVIS WITHOUT CONTRAST TECHNIQUE: Multidetector CT imaging of the chest, abdomen and pelvis was performed following the standard protocol without IV contrast. COMPARISON:  Abdominal sonogram from earlier today. 06/05/2015 chest CT. 08/07/2014 CT abdomen/pelvis. FINDINGS: CT CHEST FINDINGS Cardiovascular: Normal heart size. Trace pericardial effusion/ thickening is not appreciably changed. Left anterior descending coronary atherosclerosis. Right internal jugular MediPort terminates in the middle third of the superior vena cava. Atherosclerotic nonaneurysmal thoracic aorta. Normal caliber pulmonary arteries. Mediastinum/Nodes: No discrete thyroid nodules. Enteric  tube terminates in the body of the stomach. Oral contrast in the mid to lower thoracic esophagus suggests mild gastroesophageal reflux. No pathologically enlarged axillary, mediastinal or gross hilar lymph nodes, noting limited sensitivity for the detection of hilar adenopathy on this noncontrast study. Lungs/Pleura: No pneumothorax. Trace dependent bilateral pleural effusions. Subsegmental compressive atelectasis in the dependent lower lobes bilaterally. Otherwise no acute consolidative airspace disease. No lung masses  or significant pulmonary nodules in the aerated portions of the lungs. Musculoskeletal: No aggressive appearing focal osseous lesions. Mild thoracic spondylosis. CT ABDOMEN PELVIS FINDINGS Hepatobiliary: Normal liver with no liver mass. Mildly distended gallbladder. No radiopaque cholelithiasis. No definite gallbladder wall thickening. No pericholecystic fluid. No biliary ductal dilatation. Pancreas: Normal, with no mass or duct dilation. Spleen: Normal size. No mass. Adrenals/Urinary Tract: Normal adrenals. Moderate to marked bilateral hydroureteronephrosis to the level of the lower lumbar ureters bilaterally. No urolithiasis. Simple 1.6 cm renal cyst in the anterior interpolar right kidney. Mixed density 2.5 x 2.3 cm renal cortical mass in the posterior upper right kidney (series 2/ image 58) with significant internal regions of macroscopic fat density, increased from 1.8 x 1.6 cm, compatible with a growing renal angiomyolipoma. Simple partially exophytic 1.5 cm renal cyst in the lateral lower left kidney. No additional contour deforming renal lesions. Compressed, nearly collapsed and grossly normal bladder. Stomach/Bowel: Collapsed and grossly normal stomach noting enteric tube tip in the distal body of the stomach. There is a very large predominantly solid 30.9 x 20.2 x 23.9 cm mass filling much of the lower abdomen and pelvis (series 2/ image 82), which is new since 08/07/2014, which is continuous inferiorly with the vaginal cuff, which contains heterogeneous internal density including significant central regions of gas. This large mass is intimately associated with multiple small bowel loops and with the sigmoid colon. There is a suggestion of direct fistulization of the mass with the mid sigmoid lumen (series 5/images 59-61). The small bowel is decompressed with no significantly dilated small bowel loops. Oral contrast extends only to the mid small bowel. Distal small bowel loops are collapsed. Large bowel is  relatively collapsed with mild stool throughout the large bowel. No regions of small or large bowel wall thickening. Vascular/Lymphatic: Atherosclerotic nonaneurysmal abdominal aorta. No pathologically enlarged lymph nodes in the abdomen or pelvis. Reproductive: Status post hysterectomy and bilateral oophorectomy. Other: No free intraperitoneal air. No focal fluid collections. There is compression of the common iliac veins bilaterally by the mass with anasarca throughout the pelvis. Musculoskeletal: No aggressive appearing focal osseous lesions. Mild lumbar spondylosis. IMPRESSION: 1. Very large predominantly solid 30.9 x 20.2 x 23.9 cm mass filling much of the lower abdomen and pelvis, continuous inferiorly with the vaginal cuff, intimately associated with multiple small bowel loops and with the sigmoid colon, with probable direct fistulization of the mass with the mid sigmoid lumen, with large amount of gas centrally within the mass. Findings are compatible with recurrent neoplasm. 2. No evidence of metastatic disease in the chest. No evidence of nodal, visceral or osseous metastatic disease. 3. Bilateral hydroureteronephrosis due to distal lumbar ureteral obstruction by the mass. 4. Trace dependent bilateral pleural effusions. Compression of the common iliac veins by the mass with pelvic anasarca. 5. 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 6. Aortic atherosclerosis.  One vessel coronary atherosclerosis. Electronically Signed   By: Ilona Sorrel M.D.   On: 12/09/2016 20:59   US Abdomen Complete  Result Date: 12/09/2016 CLINICAL DATA:  Abdominal distention.  Ovarian cancer . EXAM: ABDOMEN ULTRASOUND COMPLETE COMPARISON:  [facility] 12/08/2016.  CT 08/07/2014 . FINDINGS: Gallbladder: No gallstones or wall thickening visualized. No sonographic Murphy sign noted by sonographer. Common bile duct: Diameter: 6.9 mm Liver: No focal lesion identified. Within normal limits in parenchymal  echogenicity. IVC: No abnormality visualized. Pancreas: Not visualized. Spleen: Size and appearance within normal limits. Right Kidney: Length: 10.4 cm. Echogenicity within normal limits. Moderate hydronephrosis noted . 3.2 x 2.6 x 2.7 cm solid hyperechoic lesion right upper renal pole . The lesion has increased in size from 1.9 cm. 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 1.8 x 1.9 x 2.4 cm simple cyst lower pole right kidney again noted. Left Kidney: Length: 9.8 cm. Echogenicity within normal limits. Moderate hydronephrosis noted. 1.1 x 0.7 x 0.9 cm simple cyst. Abdominal aorta: No aneurysm visualized. Other findings: Large solid pelvic mass measuring 28.0 x 17.0 x 24.3 cm IMPRESSION: 1. Large solid pelvic mass with maximum diameter of 28.0 cm noted. 2. Bilateral moderate hydronephrosis. 2 3.2 cm hyperechoic lesion right upper renal pole. 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 Electronically Signed   By: Marcello Moores  Register   On: 12/09/2016 11:39   Dg Abd Acute W/chest  Result Date: 12/08/2016 CLINICAL DATA:  Patient has had abdominal distention for 2 months. Went to her PCP and they told her her bowels were locked up and gave her a prescription for xanax. Patient stated it helped and she started using the bathroom again. EXAM: DG ABDOMEN ACUTE W/ 1V CHEST COMPARISON:  None. FINDINGS: Small bowel dilatation with multiple air-fluid levels. Small bowel measures up to 3.3 cm. Large amount of stool in the ascending colon. No radiopaque calculi or other significant radiographic abnormality is seen. Heart size and mediastinal contours are within normal limits. Both lungs are clear. Right-sided Port-A-Cath in satisfactory position. IMPRESSION: Small bowel dilatation with multiple air-fluid levels concerning for small bowel obstruction versus enteritis. No acute  cardiopulmonary disease. Electronically Signed   By: Kathreen Devoid   On: 12/08/2016 14:23   Dg Abd Portable 1v  Result Date: 12/08/2016 CLINICAL DATA:  Nasogastric tube placement.  Initial encounter. EXAM: PORTABLE ABDOMEN - 1 VIEW COMPARISON:  Abdominal radiograph performed earlier today at 2:07 p.m. FINDINGS: The patient's enteric tube is noted coiled at the body of the stomach, ending at the gastroesophageal junction. The stomach is relatively decompressed. There appears to be displacement of bowel by a large lower abdominal mass, likely reflecting the patient's known ovarian cancer. No free intra-abdominal air is seen. The visualized lung bases are clear. Mild degenerative change is noted at the lower lumbar spine. IMPRESSION: 1. Enteric tube noted coiled at the body of the stomach, ending at the gastroesophageal junction. The stomach is relatively decompressed. 2. Displacement of bowel by a large lower abdominal mass, likely reflecting the patient's known ovarian cancer. No free intra-abdominal air seen. Electronically Signed   By: Garald Balding M.D.   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 The patient knows to call the clinic with any problems, questions or concerns.    Heath Lark, MD 12/10/2016 11:31 AM

## 2016-12-10 NOTE — Care Management Note (Signed)
Case Management Note  Patient Details  Name: Joanne Copeland MRN: [id number] Date of Birth: March 16, 1941  Subjective/Objective: 76 y/o f admitted w/Melena. DNR. From home.Palliative cons-await recc.                   Action/Plan:d/c plan home.   Expected Discharge Date:   (unknown)               Expected Discharge Plan:  Home/Self Care  In-House Referral:     Discharge planning Services  CM Consult  Post Acute Care Choice:    Choice offered to:     DME Arranged:    DME Agency:     HH Arranged:    HH Agency:     Status of Service:  In process, will continue to follow  If discussed at Long Length of Stay Meetings, dates discussed:    Additional Comments:  Dessa Phi, RN 12/10/2016, 12:58 PM

## 2016-12-11 ENCOUNTER — Inpatient Hospital Stay (HOSPITAL_COMMUNITY): Payer: Medicare Other

## 2016-12-11 ENCOUNTER — Encounter (HOSPITAL_COMMUNITY): Payer: Self-pay | Admitting: Gastroenterology

## 2016-12-11 DIAGNOSIS — R1900 Intra-abdominal and pelvic swelling, mass and lump, unspecified site: Secondary | ICD-10-CM

## 2016-12-11 DIAGNOSIS — Z7189 Other specified counseling: Secondary | ICD-10-CM

## 2016-12-11 DIAGNOSIS — R600 Localized edema: Secondary | ICD-10-CM

## 2016-12-11 LAB — BASIC METABOLIC PANEL
Anion gap: 16 — ABNORMAL HIGH (ref 5–15)
BUN: 36 mg/dL — ABNORMAL HIGH (ref 6–20)
CHLORIDE: 108 mmol/L (ref 101–111)
CO2: 17 mmol/L — AB (ref 22–32)
CREATININE: 1.69 mg/dL — AB (ref 0.44–1.00)
Calcium: 8.3 mg/dL — ABNORMAL LOW (ref 8.9–10.3)
GFR calc Af Amer: 34 mL/min — ABNORMAL LOW (ref >60)
GFR calc non Af Amer: 29 mL/min — ABNORMAL LOW (ref >60)
GLUCOSE: 86 mg/dL (ref 65–99)
Potassium: 3.5 mmol/L (ref 3.5–5.1)
Sodium: 141 mmol/L (ref 135–145)

## 2016-12-11 LAB — ECHOCARDIOGRAM COMPLETE
Height: 63 in
Weight: 2726.65 oz

## 2016-12-11 LAB — CBC
HCT: 27.3 % — ABNORMAL LOW (ref 36.0–46.0)
HEMOGLOBIN: 8.7 g/dL — AB (ref 12.0–15.0)
MCH: 26.7 pg (ref 26.0–34.0)
MCHC: 31.9 g/dL (ref 30.0–36.0)
MCV: 83.7 fL (ref 78.0–100.0)
Platelets: 337 10*3/uL (ref 150–400)
RBC: 3.26 MIL/uL — ABNORMAL LOW (ref 3.87–5.11)
RDW: 16 % — ABNORMAL HIGH (ref 11.5–15.5)
WBC: 10.5 10*3/uL (ref 4.0–10.5)

## 2016-12-11 MED ORDER — MORPHINE SULFATE (CONCENTRATE) 10 MG/0.5ML PO SOLN
5.0000 mg | ORAL | Status: DC | PRN
  Administered 2016-12-13: 5 mg via SUBLINGUAL
  Filled 2016-12-11: qty 0.5

## 2016-12-11 MED ORDER — MORPHINE SULFATE (PF) 10 MG/ML IV SOLN
2.0000 mg | INTRAVENOUS | Status: DC | PRN
  Administered 2016-12-11 – 2016-12-13 (×5): 2 mg via INTRAVENOUS
  Filled 2016-12-11 (×5): qty 1

## 2016-12-11 NOTE — Progress Notes (Signed)
PROGRESS NOTE    Joanne Copeland  RJJ:[id number] DOB: [birth year] DOA: 12/08/2016 PCP: Myriam Jacobson, MD    Brief Narrative:  76 year old female history of fallopian tube cancer treated 3 years ago, hypertension, hyperlipidemia who presented with dark stools. Patient found to have a hemoglobin of 6.9. Patient was admitted for GI bleed and possible ascites. Ultrasound was done showing a huge pelvic mass. GI was consulted for EGD. Patient also now has possible hydronephrosis, small bowel obstruction. Gynecology oncology, oncology, general surgery, urology consulted.  Assessment & Plan:   Principal Problem:   Melena Active Problems:   Fallopian tube cancer, carcinoma (HCC)   Hypokalemia   Acute blood loss anemia   Thrombocytosis (HCC)   Serum total bilirubin elevated   High anion gap metabolic acidosis   Prolonged QT interval   Ascites   Pedal edema   Abdominal pain   Palliative care encounter  Pelvic Mass -With history of fallopian tube cancer 3 years ago -Patient noted to have marked distention on physical exam -Abdominal ultrasound showed large solid pelvic mass with maximum diameter of 28 cm, patient with bilateral moderate hydronephrosis, right angiomyolipoma  -Oncology, Dr. Alvy Bimler. Patient seen and case discussed with Dr. Alvy Bimler and Dr. Lucia Gaskins. Little to offer per Dr. Lucia Gaskins. Per Dr. Alvy Bimler, not candidate for chemo -IR guided biopsy obtained 4/25, pending results -Plan for focus on palliation. Discussed with patient and Palliative Care. Will remove NG today. - Have consulted IR for g-tube placement for palliation.  GI bleed/acute blood loss anemia -Hemoglobin on admission 6.9 -Noted to be FOBT positive -Patient received 2 units PRBC, hemoglobin currently 9.1 -Gastroenterology consult and appreciated, pt underwent EGD 4/24. No significant lesions in upper GI tract -Stable at present  Small bowel obstruction with nausea/vomiting -Abdominal x-ray showed small  bowel dilatation with multiple air-fluid levels concerning for small bowel obstruction versus enteritis -NG removed for comfort -IR consulted for g-tube placement for palliation  Acute kidney injury/bilateral hydronephrosis -Likely secondary to pelvic mass -Baseline creatinine 1, upon admission, creatinine 1.63 -Urology consulted and appreciated -Discussed case with Dr. Tresa Moore. No plan for neph tubes or stents -focus on comfort at this time  Hypokalemia -Replaced - stable currently  Fever/Leukocytosis (SIRS vs Sepsis) -Likely secondary to the above -Blood culture showed no growth to date -Patient had been continued on empiric ceftriaxone -leukocytosis resolved. Pt afebrile  High anion gap metabolic acidosis -Lactic acid 1.4 -Suspect due to SBO -Currently stable  Thrombocytosis -Resolved -Stable  Pedal edema -Possibly related to the above -Echocardiogram was ordered  Prolong QT  -Likely due to hypokalemia, continue telemetry monitoring -Stable at present  Hypoalbuminemia/weight loss -Likely secondary to metastatic disease -Will allow comfort feeds  DVT prophylaxis: Lovenox subQ Code Status: DNR Family Communication: Pt in room, family at bedside Disposition Plan: Uncertain at this time  Consultants:   Urology  IR  Oncology  Gyn Onc  GI  General Surgery  Procedures:   EGD 4/24  IR guided biopsy of mass 4/25  Antimicrobials: Anti-infectives    Start     Dose/Rate Route Frequency Ordered Stop   12/08/16 1700  cefTRIAXone (ROCEPHIN) 1 g in dextrose 5 % 50 mL IVPB     1 g 100 mL/hr over 30 Minutes Intravenous Every 24 hours 12/08/16 1606        Subjective: Wanting to eat solid foods  Objective: Vitals:   12/10/16 1803 12/10/16 2137 12/11/16 0609 12/11/16 1330  BP: (!) 100/50 (!) 101/47 (!) 109/57 102/62  Pulse: 86  88 88 88  Resp: 18 18 18 18   Temp: 98.5 F (36.9 C) 98.7 F (37.1 C) 98.5 F (36.9 C) 98.9 F (37.2 C)  TempSrc:  Oral Oral Oral Oral  SpO2: 98% 97% 97% 97%  Weight:   77.3 kg (170 lb 6.7 oz)   Height:        Intake/Output Summary (Last 24 hours) at 12/11/16 1641 Last data filed at 12/11/16 1440  Gross per 24 hour  Intake             3553 ml  Output                0 ml  Net             3553 ml   Filed Weights   12/09/16 0500 12/10/16 0500 12/11/16 0609  Weight: 72.4 kg (159 lb 9.8 oz) 76.2 kg (167 lb 15.9 oz) 77.3 kg (170 lb 6.7 oz)    Examination:  General exam: Awake, laying in bed, in nad Respiratory system: normal resp effort, no audible wheezing. Cardiovascular system: regular rate, s1, s2 Gastrointestinal system: distended, palpable intra-abdominal mass, decreased BS Central nervous system: cn2-12 grossly intact, strength intact Extremities: perfused, no clubbing Skin: no pallor, no rashes Psychiatry: mood normal// no visual hallucinations   Data Reviewed: I have personally reviewed following labs and imaging studies  CBC:  Recent Labs Lab 12/08/16 1253 12/08/16 2330 12/09/16 0611 12/09/16 1208 12/10/16 0530 12/11/16 0656  WBC 10.0 13.2* 12.5* 13.3* 12.6* 10.5  NEUTROABS 7.8*  --   --   --   --   --   HGB 6.9* 8.3* 9.7* 9.6* 9.1* 8.7*  HCT 21.5* 25.7* 30.1* 29.3* 27.8* 27.3*  MCV 82.7 82.4 83.4 83.0 83.2 83.7  PLT 419* 387 384 370 370 354   Basic Metabolic Panel:  Recent Labs Lab 12/08/16 1253 12/08/16 1549 12/08/16 2330 12/09/16 0611 12/10/16 0530 12/11/16 0656  NA 136  --  135 136 138 141  K 2.6*  --  3.2* 3.5 3.7 3.5  CL 101  --  102 101 105 108  CO2 17*  --  14* 17* 18* 17*  GLUCOSE 76  --  98 102* 92 86  BUN 25*  --  26* 27* 26* 36*  CREATININE 1.63*  --  1.61* 1.60* 1.50* 1.69*  CALCIUM 8.0*  --  8.3* 8.3* 8.1* 8.3*  MG  --  1.4*  --  1.4*  --   --    GFR: Estimated Creatinine Clearance: 29.2 mL/min (A) (by C-G formula based on SCr of 1.69 mg/dL (H)). Liver Function Tests:  Recent Labs Lab 12/08/16 1253 12/09/16 0611 12/10/16 0530  AST 25  26 25   ALT 7* 8* 8*  ALKPHOS 42 50 53  BILITOT 1.3* 1.3* 1.1  PROT 5.4* 5.7* 5.4*  ALBUMIN 2.5* 2.5* 2.2*    Recent Labs Lab 12/08/16 1253  LIPASE 17    Recent Labs Lab 12/08/16 1253  AMMONIA 16   Coagulation Profile:  Recent Labs Lab 12/08/16 1253  INR 1.11   Cardiac Enzymes:  Recent Labs Lab 12/08/16 1253  TROPONINI <0.03   BNP (last 3 results) No results for input(s): PROBNP in the last 8760 hours. HbA1C: No results for input(s): HGBA1C in the last 72 hours. CBG: No results for input(s): GLUCAP in the last 168 hours. Lipid Profile: No results for input(s): CHOL, HDL, LDLCALC, TRIG, CHOLHDL, LDLDIRECT in the last 72 hours. Thyroid Function Tests: No results for input(s): TSH,  T4TOTAL, FREET4, T3FREE, THYROIDAB in the last 72 hours. Anemia Panel: No results for input(s): VITAMINB12, FOLATE, FERRITIN, TIBC, IRON, RETICCTPCT in the last 72 hours. Sepsis Labs:  Recent Labs Lab 12/08/16 1551  LATICACIDVEN 1.4    Recent Results (from the past 240 hour(s))  Culture, blood (Routine X 2) w Reflex to ID Panel     Status: Abnormal   Collection Time: 12/08/16  4:06 PM  Result Value Ref Range Status   Specimen Description LEFT ANTECUBITAL  Final   Special Requests   Final    BOTTLES DRAWN AEROBIC AND ANAEROBIC Blood Culture adequate volume   Culture  Setup Time   Final    GRAM POSITIVE COCCI IN CLUSTERS AEROBIC BOTTLE ONLY CRITICAL RESULT CALLED TO, READ BACK BY AND VERIFIED WITH: Melodye Ped Pharm.D. 17:10 12/09/16 ([email or web address])    Culture (A)  Final    STAPHYLOCOCCUS SPECIES (COAGULASE NEGATIVE) THE SIGNIFICANCE OF ISOLATING THIS ORGANISM FROM A SINGLE SET OF BLOOD CULTURES WHEN MULTIPLE SETS ARE DRAWN IS UNCERTAIN. PLEASE NOTIFY THE MICROBIOLOGY DEPARTMENT WITHIN ONE WEEK IF SPECIATION AND SENSITIVITIES ARE REQUIRED. Performed at [facility], [place] [address] [place], [place] [address]    Report Status 12/10/2016 FINAL  Final  Blood Culture ID Panel  (Reflexed)     Status: Abnormal   Collection Time: 12/08/16  4:06 PM  Result Value Ref Range Status   Enterococcus species NOT DETECTED NOT DETECTED Final   Listeria monocytogenes NOT DETECTED NOT DETECTED Final   Staphylococcus species DETECTED (A) NOT DETECTED Final    Comment: Methicillin (oxacillin) susceptible coagulase negative staphylococcus. Possible blood culture contaminant (unless isolated from more than one blood culture draw or clinical case suggests pathogenicity). No antibiotic treatment is indicated for blood  culture contaminants. CRITICAL RESULT CALLED TO, READ BACK BY AND VERIFIED WITH: J. Scherrie November Pharm.D. 17:10 12/09/16 ([email or web address])    Staphylococcus aureus NOT DETECTED NOT DETECTED Final   Methicillin resistance NOT DETECTED NOT DETECTED Final   Streptococcus species NOT DETECTED NOT DETECTED Final   Streptococcus agalactiae NOT DETECTED NOT DETECTED Final   Streptococcus pneumoniae NOT DETECTED NOT DETECTED Final   Streptococcus pyogenes NOT DETECTED NOT DETECTED Final   Acinetobacter baumannii NOT DETECTED NOT DETECTED Final   Enterobacteriaceae species NOT DETECTED NOT DETECTED Final   Enterobacter cloacae complex NOT DETECTED NOT DETECTED Final   Escherichia coli NOT DETECTED NOT DETECTED Final   Klebsiella oxytoca NOT DETECTED NOT DETECTED Final   Klebsiella pneumoniae NOT DETECTED NOT DETECTED Final   Proteus species NOT DETECTED NOT DETECTED Final   Serratia marcescens NOT DETECTED NOT DETECTED Final   Haemophilus influenzae NOT DETECTED NOT DETECTED Final   Neisseria meningitidis NOT DETECTED NOT DETECTED Final   Pseudomonas aeruginosa NOT DETECTED NOT DETECTED Final   Candida albicans NOT DETECTED NOT DETECTED Final   Candida glabrata NOT DETECTED NOT DETECTED Final   Candida krusei NOT DETECTED NOT DETECTED Final   Candida parapsilosis NOT DETECTED NOT DETECTED Final   Candida tropicalis NOT DETECTED NOT DETECTED Final    Comment: Performed at [facility], [place] [address] [place], [place] [address]  Culture, blood (Routine X 2) w Reflex to ID Panel     Status: None (Preliminary result)   Collection Time: 12/08/16  4:10 PM  Result Value Ref Range Status   Specimen Description BLOOD RIGHT ANTECUBITAL  Final   Special Requests   Final    BOTTLES DRAWN AEROBIC AND ANAEROBIC Blood Culture adequate volume  Culture   Final    NO GROWTH 3 DAYS Performed at [facility], [place] [address], [place], [place] [address]    Report Status PENDING  Incomplete  MRSA PCR Screening     Status: None   Collection Time: 12/08/16  9:49 PM  Result Value Ref Range Status   MRSA by PCR NEGATIVE NEGATIVE Final    Comment:        The GeneXpert MRSA Assay (FDA approved for NASAL specimens only), is one component of a comprehensive MRSA colonization surveillance program. It is not intended to diagnose MRSA infection nor to guide or monitor treatment for MRSA infections.      Radiology Studies: Ct Abdomen Pelvis Wo Contrast  Result Date: 12/09/2016 CLINICAL DATA:  76 year old female with a history of TAHBSO in 2015 with 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 Patient admitted with abdominal distention, bowel obstruction, bilateral hydronephrosis and large pelvic mass at sonography. EXAM: CT CHEST, ABDOMEN AND PELVIS WITHOUT CONTRAST TECHNIQUE: Multidetector CT imaging of the chest, abdomen and pelvis was performed following the standard protocol without IV contrast. COMPARISON:  Abdominal sonogram from earlier today. 06/05/2015 chest CT. 08/07/2014 CT abdomen/pelvis. FINDINGS: CT CHEST FINDINGS Cardiovascular: Normal heart size. Trace pericardial effusion/ thickening is not appreciably changed. Left anterior descending coronary atherosclerosis. Right internal jugular MediPort terminates in the middle third of the superior vena cava. Atherosclerotic nonaneurysmal thoracic aorta. Normal caliber pulmonary arteries. Mediastinum/Nodes:  No discrete thyroid nodules. Enteric tube terminates in the body of the stomach. Oral contrast in the mid to lower thoracic esophagus suggests mild gastroesophageal reflux. No pathologically enlarged axillary, mediastinal or gross hilar lymph nodes, noting limited sensitivity for the detection of hilar adenopathy on this noncontrast study. Lungs/Pleura: No pneumothorax. Trace dependent bilateral pleural effusions. Subsegmental compressive atelectasis in the dependent lower lobes bilaterally. Otherwise no acute consolidative airspace disease. No lung masses or significant pulmonary nodules in the aerated portions of the lungs. Musculoskeletal: No aggressive appearing focal osseous lesions. Mild thoracic spondylosis. CT ABDOMEN PELVIS FINDINGS Hepatobiliary: Normal liver with no liver mass. Mildly distended gallbladder. No radiopaque cholelithiasis. No definite gallbladder wall thickening. No pericholecystic fluid. No biliary ductal dilatation. Pancreas: Normal, with no mass or duct dilation. Spleen: Normal size. No mass. Adrenals/Urinary Tract: Normal adrenals. Moderate to marked bilateral hydroureteronephrosis to the level of the lower lumbar ureters bilaterally. No urolithiasis. Simple 1.6 cm renal cyst in the anterior interpolar right kidney. Mixed density 2.5 x 2.3 cm renal cortical mass in the posterior upper right kidney (series 2/ image 58) with significant internal regions of macroscopic fat density, increased from 1.8 x 1.6 cm, compatible with a growing renal angiomyolipoma. Simple partially exophytic 1.5 cm renal cyst in the lateral lower left kidney. No additional contour deforming renal lesions. Compressed, nearly collapsed and grossly normal bladder. Stomach/Bowel: Collapsed and grossly normal stomach noting enteric tube tip in the distal body of the stomach. There is a very large predominantly solid 30.9 x 20.2 x 23.9 cm mass filling much of the lower abdomen and pelvis (series 2/ image 82), which is  new since 08/07/2014, which is continuous inferiorly with the vaginal cuff, which contains heterogeneous internal density including significant central regions of gas. This large mass is intimately associated with multiple small bowel loops and with the sigmoid colon. There is a suggestion of direct fistulization of the mass with the mid sigmoid lumen (series 5/images 59-61). The small bowel is decompressed with no significantly dilated small bowel loops. Oral contrast extends  only to the mid small bowel. Distal small bowel loops are collapsed. Large bowel is relatively collapsed with mild stool throughout the large bowel. No regions of small or large bowel wall thickening. Vascular/Lymphatic: Atherosclerotic nonaneurysmal abdominal aorta. No pathologically enlarged lymph nodes in the abdomen or pelvis. Reproductive: Status post hysterectomy and bilateral oophorectomy. Other: No free intraperitoneal air. No focal fluid collections. There is compression of the common iliac veins bilaterally by the mass with anasarca throughout the pelvis. Musculoskeletal: No aggressive appearing focal osseous lesions. Mild lumbar spondylosis. IMPRESSION: 1. Very large predominantly solid 30.9 x 20.2 x 23.9 cm mass filling much of the lower abdomen and pelvis, continuous inferiorly with the vaginal cuff, intimately associated with multiple small bowel loops and with the sigmoid colon, with probable direct fistulization of the mass with the mid sigmoid lumen, with large amount of gas centrally within the mass. Findings are compatible with recurrent neoplasm. 2. No evidence of metastatic disease in the chest. No evidence of nodal, visceral or osseous metastatic disease. 3. Bilateral hydroureteronephrosis due to distal lumbar ureteral obstruction by the mass. 4. Trace dependent bilateral pleural effusions. Compression of the common iliac veins by the mass with pelvic anasarca. 5. Mild interval growth of 2.5 cm renal angiomyolipoma in the  right upper kidney. 6. Aortic atherosclerosis.  One vessel coronary atherosclerosis. Electronically Signed   By: Ilona Sorrel M.D.   On: 12/09/2016 20:59   Ct Chest Wo Contrast  Result Date: 12/09/2016 CLINICAL DATA:  76 year old female with a history of TAHBSO in 2015 with 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 Patient admitted with abdominal distention, bowel obstruction, bilateral hydronephrosis and large pelvic mass at sonography. EXAM: CT CHEST, ABDOMEN AND PELVIS WITHOUT CONTRAST TECHNIQUE: Multidetector CT imaging of the chest, abdomen and pelvis was performed following the standard protocol without IV contrast. COMPARISON:  Abdominal sonogram from earlier today. 06/05/2015 chest CT. 08/07/2014 CT abdomen/pelvis. FINDINGS: CT CHEST FINDINGS Cardiovascular: Normal heart size. Trace pericardial effusion/ thickening is not appreciably changed. Left anterior descending coronary atherosclerosis. Right internal jugular MediPort terminates in the middle third of the superior vena cava. Atherosclerotic nonaneurysmal thoracic aorta. Normal caliber pulmonary arteries. Mediastinum/Nodes: No discrete thyroid nodules. Enteric tube terminates in the body of the stomach. Oral contrast in the mid to lower thoracic esophagus suggests mild gastroesophageal reflux. No pathologically enlarged axillary, mediastinal or gross hilar lymph nodes, noting limited sensitivity for the detection of hilar adenopathy on this noncontrast study. Lungs/Pleura: No pneumothorax. Trace dependent bilateral pleural effusions. Subsegmental compressive atelectasis in the dependent lower lobes bilaterally. Otherwise no acute consolidative airspace disease. No lung masses or significant pulmonary nodules in the aerated portions of the lungs. Musculoskeletal: No aggressive appearing focal osseous lesions. Mild thoracic spondylosis. CT ABDOMEN PELVIS FINDINGS Hepatobiliary: Normal liver with no liver mass. Mildly distended  gallbladder. No radiopaque cholelithiasis. No definite gallbladder wall thickening. No pericholecystic fluid. No biliary ductal dilatation. Pancreas: Normal, with no mass or duct dilation. Spleen: Normal size. No mass. Adrenals/Urinary Tract: Normal adrenals. Moderate to marked bilateral hydroureteronephrosis to the level of the lower lumbar ureters bilaterally. No urolithiasis. Simple 1.6 cm renal cyst in the anterior interpolar right kidney. Mixed density 2.5 x 2.3 cm renal cortical mass in the posterior upper right kidney (series 2/ image 58) with significant internal regions of macroscopic fat density, increased from 1.8 x 1.6 cm, compatible with a growing renal angiomyolipoma. Simple partially exophytic 1.5 cm renal cyst in the lateral lower left kidney. No additional contour deforming renal lesions.  Compressed, nearly collapsed and grossly normal bladder. Stomach/Bowel: Collapsed and grossly normal stomach noting enteric tube tip in the distal body of the stomach. There is a very large predominantly solid 30.9 x 20.2 x 23.9 cm mass filling much of the lower abdomen and pelvis (series 2/ image 82), which is new since 08/07/2014, which is continuous inferiorly with the vaginal cuff, which contains heterogeneous internal density including significant central regions of gas. This large mass is intimately associated with multiple small bowel loops and with the sigmoid colon. There is a suggestion of direct fistulization of the mass with the mid sigmoid lumen (series 5/images 59-61). The small bowel is decompressed with no significantly dilated small bowel loops. Oral contrast extends only to the mid small bowel. Distal small bowel loops are collapsed. Large bowel is relatively collapsed with mild stool throughout the large bowel. No regions of small or large bowel wall thickening. Vascular/Lymphatic: Atherosclerotic nonaneurysmal abdominal aorta. No pathologically enlarged lymph nodes in the abdomen or pelvis.  Reproductive: Status post hysterectomy and bilateral oophorectomy. Other: No free intraperitoneal air. No focal fluid collections. There is compression of the common iliac veins bilaterally by the mass with anasarca throughout the pelvis. Musculoskeletal: No aggressive appearing focal osseous lesions. Mild lumbar spondylosis. IMPRESSION: 1. Very large predominantly solid 30.9 x 20.2 x 23.9 cm mass filling much of the lower abdomen and pelvis, continuous inferiorly with the vaginal cuff, intimately associated with multiple small bowel loops and with the sigmoid colon, with probable direct fistulization of the mass with the mid sigmoid lumen, with large amount of gas centrally within the mass. Findings are compatible with recurrent neoplasm. 2. No evidence of metastatic disease in the chest. No evidence of nodal, visceral or osseous metastatic disease. 3. Bilateral hydroureteronephrosis due to distal lumbar ureteral obstruction by the mass. 4. Trace dependent bilateral pleural effusions. Compression of the common iliac veins by the mass with pelvic anasarca. 5. 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 6. Aortic atherosclerosis.  One vessel coronary atherosclerosis. Electronically Signed   By: Ilona Sorrel M.D.   On: 12/09/2016 20:59   Ct Biopsy  Result Date: 12/10/2016 CLINICAL DATA:  History of fallopian tube cancer. Large pelvic mass. EXAM: CT GUIDED CORE BIOPSY OF PELVIC MASS ANESTHESIA/SEDATION: Intravenous Fentanyl and Versed were administered as conscious sedation during continuous monitoring of the patient's level of consciousness and physiological / cardiorespiratory status by the radiology RN, with a total moderate sedation time of 6 minutes. PROCEDURE: The procedure risks, benefits, and alternatives were explained to the patient. Questions regarding the procedure were encouraged and answered. The patient understands and consents to the procedure. Select axial scans  through the pelvis were obtained. The mass was localized and an appropriate skin entry site was determined and marked. The operative field was prepped with chlorhexidinein a sterile fashion, and a sterile drape was applied covering the operative field. A sterile gown and sterile gloves were used for the procedure. Local anesthesia was provided with 1% Lidocaine. Under CT fluoroscopic guidance, a 17 gauge trocar needle was advanced to the margin of the lesion. Once needle tip position was confirmed, coaxial 18-gauge core biopsy samples were obtained, submitted in formalin to surgical pathology. The guide needle was removed. Postprocedure scans show no hemorrhage or other apparent complication. The patient tolerated the procedure well. COMPLICATIONS: None immediate FINDINGS: Large mass occupying most of the pelvis with central low-attenuation regions and gas, as noted on prior CT. Representative core biopsy samples  of the peripheral right lower aspect of the lesion obtained without complication. IMPRESSION: 1. Technically successful CT-guided core biopsy,  pelvic mass Electronically Signed   By: Lucrezia Europe M.D.   On: 12/10/2016 16:36    Scheduled Meds: . feeding supplement  1 Container Oral TID BM  . potassium chloride  40 mEq Oral Once  . sodium chloride flush  3 mL Intravenous Q12H   Continuous Infusions: . sodium chloride 125 mL/hr at 12/11/16 1247  . sodium chloride    . cefTRIAXone (ROCEPHIN)  IV Stopped (12/10/16 1651)     LOS: 3 days   Steffi Noviello, Orpah Melter, MD [facility] Pager [phone]  If 7PM-7AM, please contact night-coverage [email or web address] Password [facility] 12/11/2016, 4:41 PM

## 2016-12-11 NOTE — Progress Notes (Signed)
  Echocardiogram 2D Echocardiogram has been performed.  Joanne Copeland 12/11/2016, 10:03 AM

## 2016-12-11 NOTE — Progress Notes (Signed)
[facility]:  [phone] General Surgery Progress Note   LOS: 3 days  POD -  2 Days Post-Op  Chief Complaint: Abdominal distention  Assessment and Plan: 1.  Massive abdominal tumor - presumed to be recurrence of prior left tubal carcinoma  IR biopsy on 12/10/2016 - Path pending  Seen by Drs. Daisy Floro, [place].  General surgery has nothing to offer this lady with advanced disease.  Dr. Delsa Sale had mentioned extirpatng procedure (to be done at medical center?, I don't know if she had pursued this)  Consider palliative care   Now has NGT --- which is bothering her.  If not totally obstructed, this could come out, but she does not want to replace it.  She needs to ambulate - but is not motivated.  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  Bilateral hydronephrosis  Creat - 1.6 - 12/11/2016 4.  Weight loss/severe malnourishment 5.  Malnutrition/Deconditioning   Albumin - 2.2 - 01/09/2017 6.  Anemia  Hgb - 8.7 - 12/11/2016   Principal Problem:   Melena Active Problems:   Fallopian tube cancer, carcinoma (HCC)   Hypokalemia   Acute blood loss anemia   Thrombocytosis (HCC)   Serum total bilirubin elevated   High anion gap metabolic acidosis   Prolonged QT interval   Ascites   Pedal edema   Abdominal pain   Palliative care encounter  Subjective:  NGT is bothering her the most.  Surprisingly, she is having essentially no abdominal pain.  She says that she cannot ambulate.  Objective:   Vitals:   12/10/16 2137 12/11/16 0609  BP: (!) 101/47 (!) 109/57  Pulse: 88 88  Resp: 18 18  Temp: 98.7 F (37.1 C) 98.5 F (36.9 C)     Intake/Output from previous day:  04/25 0701 - 04/26 0700 In: 2553  [I.V.:2503; IV Piggyback:50] Out: -   Intake/Output this shift:  No intake/output data recorded.   Physical Exam:   General: WN older WF who is alert and oriented.   Has NGT.   HEENT: Normal. Pupils equal. .   Lungs: Clear   Abdomen: Massive abdominal tumor without any localized tenderness   Lab Results:    Recent Labs  12/10/16 0530 12/11/16 0656  WBC 12.6* 10.5  HGB 9.1* 8.7*  HCT 27.8* 27.3*  PLT 370 337    BMET   Recent Labs  12/10/16 0530 12/11/16 0656  NA 138 141  K 3.7 3.5  CL 105 108  CO2 18* 17*  GLUCOSE 92 86  BUN 26* 36*  CREATININE 1.50* 1.69*  CALCIUM 8.1* 8.3*    PT/INR   Recent Labs  12/08/16 1253  LABPROT 14.4  INR 1.11    ABG  No results for input(s): PHART, HCO3 in the last 72 hours.  Invalid input(s): PCO2, PO2   Studies/Results:  Ct Abdomen Pelvis Wo Contrast  Result Date: 12/09/2016 CLINICAL DATA:  76 year old female with a history of TAHBSO in 2015 with 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 Patient admitted with abdominal distention, bowel obstruction, bilateral hydronephrosis and large pelvic mass at sonography. EXAM: CT CHEST, ABDOMEN AND PELVIS WITHOUT CONTRAST TECHNIQUE: Multidetector CT imaging of the chest, abdomen and pelvis was  performed following the standard protocol without IV contrast. COMPARISON:  Abdominal sonogram from earlier today. 06/05/2015 chest CT. 08/07/2014 CT abdomen/pelvis. FINDINGS: CT CHEST FINDINGS Cardiovascular: Normal heart size. Trace pericardial effusion/ thickening is not appreciably changed. Left anterior descending coronary atherosclerosis. Right internal jugular MediPort terminates in the middle third of the superior vena cava. Atherosclerotic nonaneurysmal thoracic aorta. Normal caliber pulmonary arteries. Mediastinum/Nodes: No discrete thyroid nodules. Enteric tube terminates in the body of the stomach. Oral contrast in the mid to lower thoracic esophagus suggests mild  gastroesophageal reflux. No pathologically enlarged axillary, mediastinal or gross hilar lymph nodes, noting limited sensitivity for the detection of hilar adenopathy on this noncontrast study. Lungs/Pleura: No pneumothorax. Trace dependent bilateral pleural effusions. Subsegmental compressive atelectasis in the dependent lower lobes bilaterally. Otherwise no acute consolidative airspace disease. No lung masses or significant pulmonary nodules in the aerated portions of the lungs. Musculoskeletal: No aggressive appearing focal osseous lesions. Mild thoracic spondylosis. CT ABDOMEN PELVIS FINDINGS Hepatobiliary: Normal liver with no liver mass. Mildly distended gallbladder. No radiopaque cholelithiasis. No definite gallbladder wall thickening. No pericholecystic fluid. No biliary ductal dilatation. Pancreas: Normal, with no mass or duct dilation. Spleen: Normal size. No mass. Adrenals/Urinary Tract: Normal adrenals. Moderate to marked bilateral hydroureteronephrosis to the level of the lower lumbar ureters bilaterally. No urolithiasis. Simple 1.6 cm renal cyst in the anterior interpolar right kidney. Mixed density 2.5 x 2.3 cm renal cortical mass in the posterior upper right kidney (series 2/ image 58) with significant internal regions of macroscopic fat density, increased from 1.8 x 1.6 cm, compatible with a growing renal angiomyolipoma. Simple partially exophytic 1.5 cm renal cyst in the lateral lower left kidney. No additional contour deforming renal lesions. Compressed, nearly collapsed and grossly normal bladder. Stomach/Bowel: Collapsed and grossly normal stomach noting enteric tube tip in the distal body of the stomach. There is a very large predominantly solid 30.9 x 20.2 x 23.9 cm mass filling much of the lower abdomen and pelvis (series 2/ image 82), which is new since 08/07/2014, which is continuous inferiorly with the vaginal cuff, which contains heterogeneous internal density including significant  central regions of gas. This large mass is intimately associated with multiple small bowel loops and with the sigmoid colon. There is a suggestion of direct fistulization of the mass with the mid sigmoid lumen (series 5/images 59-61). The small bowel is decompressed with no significantly dilated small bowel loops. Oral contrast extends only to the mid small bowel. Distal small bowel loops are collapsed. Large bowel is relatively collapsed with mild stool throughout the large bowel. No regions of small or large bowel wall thickening. Vascular/Lymphatic: Atherosclerotic nonaneurysmal abdominal aorta. No pathologically enlarged lymph nodes in the abdomen or pelvis. Reproductive: Status post hysterectomy and bilateral oophorectomy. Other: No free intraperitoneal air. No focal fluid collections. There is compression of the common iliac veins bilaterally by the mass with anasarca throughout the pelvis. Musculoskeletal: No aggressive appearing focal osseous lesions. Mild lumbar spondylosis. IMPRESSION: 1. Very large predominantly solid 30.9 x 20.2 x 23.9 cm mass filling much of the lower abdomen and pelvis, continuous inferiorly with the vaginal cuff, intimately associated with multiple small bowel loops and with the sigmoid colon, with probable direct fistulization of the mass with the mid sigmoid lumen, with large amount of gas centrally within the mass. Findings are compatible with recurrent neoplasm. 2. No evidence of metastatic disease in the chest. No evidence of nodal, visceral or osseous metastatic disease. 3. Bilateral hydroureteronephrosis due to distal lumbar ureteral  obstruction by the mass. 4. Trace dependent bilateral pleural effusions. Compression of the common iliac veins by the mass with pelvic anasarca. 5. 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 6. Aortic atherosclerosis.  One vessel coronary atherosclerosis. Electronically Signed   By: Ilona Sorrel M.D.   On:  12/09/2016 20:59   Ct Chest Wo Contrast  Result Date: 12/09/2016 CLINICAL DATA:  76 year old female with a history of TAHBSO in 2015 with 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 Patient admitted with abdominal distention, bowel obstruction, bilateral hydronephrosis and large pelvic mass at sonography. EXAM: CT CHEST, ABDOMEN AND PELVIS WITHOUT CONTRAST TECHNIQUE: Multidetector CT imaging of the chest, abdomen and pelvis was performed following the standard protocol without IV contrast. COMPARISON:  Abdominal sonogram from earlier today. 06/05/2015 chest CT. 08/07/2014 CT abdomen/pelvis. FINDINGS: CT CHEST FINDINGS Cardiovascular: Normal heart size. Trace pericardial effusion/ thickening is not appreciably changed. Left anterior descending coronary atherosclerosis. Right internal jugular MediPort terminates in the middle third of the superior vena cava. Atherosclerotic nonaneurysmal thoracic aorta. Normal caliber pulmonary arteries. Mediastinum/Nodes: No discrete thyroid nodules. Enteric tube terminates in the body of the stomach. Oral contrast in the mid to lower thoracic esophagus suggests mild gastroesophageal reflux. No pathologically enlarged axillary, mediastinal or gross hilar lymph nodes, noting limited sensitivity for the detection of hilar adenopathy on this noncontrast study. Lungs/Pleura: No pneumothorax. Trace dependent bilateral pleural effusions. Subsegmental compressive atelectasis in the dependent lower lobes bilaterally. Otherwise no acute consolidative airspace disease. No lung masses or significant pulmonary nodules in the aerated portions of the lungs. Musculoskeletal: No aggressive appearing focal osseous lesions. Mild thoracic spondylosis. CT ABDOMEN PELVIS FINDINGS Hepatobiliary: Normal liver with no liver mass. Mildly distended gallbladder. No radiopaque cholelithiasis. No definite gallbladder wall thickening. No pericholecystic fluid. No biliary ductal dilatation.  Pancreas: Normal, with no mass or duct dilation. Spleen: Normal size. No mass. Adrenals/Urinary Tract: Normal adrenals. Moderate to marked bilateral hydroureteronephrosis to the level of the lower lumbar ureters bilaterally. No urolithiasis. Simple 1.6 cm renal cyst in the anterior interpolar right kidney. Mixed density 2.5 x 2.3 cm renal cortical mass in the posterior upper right kidney (series 2/ image 58) with significant internal regions of macroscopic fat density, increased from 1.8 x 1.6 cm, compatible with a growing renal angiomyolipoma. Simple partially exophytic 1.5 cm renal cyst in the lateral lower left kidney. No additional contour deforming renal lesions. Compressed, nearly collapsed and grossly normal bladder. Stomach/Bowel: Collapsed and grossly normal stomach noting enteric tube tip in the distal body of the stomach. There is a very large predominantly solid 30.9 x 20.2 x 23.9 cm mass filling much of the lower abdomen and pelvis (series 2/ image 82), which is new since 08/07/2014, which is continuous inferiorly with the vaginal cuff, which contains heterogeneous internal density including significant central regions of gas. This large mass is intimately associated with multiple small bowel loops and with the sigmoid colon. There is a suggestion of direct fistulization of the mass with the mid sigmoid lumen (series 5/images 59-61). The small bowel is decompressed with no significantly dilated small bowel loops. Oral contrast extends only to the mid small bowel. Distal small bowel loops are collapsed. Large bowel is relatively collapsed with mild stool throughout the large bowel. No regions of small or large bowel wall thickening. Vascular/Lymphatic: Atherosclerotic nonaneurysmal abdominal aorta. No pathologically enlarged lymph nodes in the abdomen or pelvis. Reproductive: Status post hysterectomy and bilateral oophorectomy. Other: No free intraperitoneal air.  No focal fluid collections. There is  compression of the common iliac veins bilaterally by the mass with anasarca throughout the pelvis. Musculoskeletal: No aggressive appearing focal osseous lesions. Mild lumbar spondylosis. IMPRESSION: 1. Very large predominantly solid 30.9 x 20.2 x 23.9 cm mass filling much of the lower abdomen and pelvis, continuous inferiorly with the vaginal cuff, intimately associated with multiple small bowel loops and with the sigmoid colon, with probable direct fistulization of the mass with the mid sigmoid lumen, with large amount of gas centrally within the mass. Findings are compatible with recurrent neoplasm. 2. No evidence of metastatic disease in the chest. No evidence of nodal, visceral or osseous metastatic disease. 3. Bilateral hydroureteronephrosis due to distal lumbar ureteral obstruction by the mass. 4. Trace dependent bilateral pleural effusions. Compression of the common iliac veins by the mass with pelvic anasarca. 5. 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 6. Aortic atherosclerosis.  One vessel coronary atherosclerosis. Electronically Signed   By: Ilona Sorrel M.D.   On: 12/09/2016 20:59   US Abdomen Complete  Result Date: 12/09/2016 CLINICAL DATA:  Abdominal distention.  Ovarian cancer . EXAM: ABDOMEN ULTRASOUND COMPLETE COMPARISON:  [facility] 12/08/2016.  CT 08/07/2014 . FINDINGS: Gallbladder: No gallstones or wall thickening visualized. No sonographic Murphy sign noted by sonographer. Common bile duct: Diameter: 6.9 mm Liver: No focal lesion identified. Within normal limits in parenchymal echogenicity. IVC: No abnormality visualized. Pancreas: Not visualized. Spleen: Size and appearance within normal limits. Right Kidney: Length: 10.4 cm. Echogenicity within normal limits. Moderate hydronephrosis noted . 3.2 x 2.6 x 2.7 cm solid hyperechoic lesion right upper renal pole . The lesion has increased in size from 1.9 cm. Although a renal cell carcinoma cannot be excluded,  this most likely represents a enlarging angiomyolipoma given prior CT finding. 1.8 x 1.9 x 2.4 cm simple cyst lower pole right kidney again noted. Left Kidney: Length: 9.8 cm. Echogenicity within normal limits. Moderate hydronephrosis noted. 1.1 x 0.7 x 0.9 cm simple cyst. Abdominal aorta: No aneurysm visualized. Other findings: Large solid pelvic mass measuring 28.0 x 17.0 x 24.3 cm IMPRESSION: 1. Large solid pelvic mass with maximum diameter of 28.0 cm noted. 2. Bilateral moderate hydronephrosis. 2 3.2 cm hyperechoic lesion right upper renal pole. 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 Electronically Signed   By: Marcello Moores  Register   On: 12/09/2016 11:39   Ct Biopsy  Result Date: 12/10/2016 CLINICAL DATA:  History of fallopian tube cancer. Large pelvic mass. EXAM: CT GUIDED CORE BIOPSY OF PELVIC MASS ANESTHESIA/SEDATION: Intravenous Fentanyl and Versed were administered as conscious sedation during continuous monitoring of the patient's level of consciousness and physiological / cardiorespiratory status by the radiology RN, with a total moderate sedation time of 6 minutes. PROCEDURE: The procedure risks, benefits, and alternatives were explained to the patient. Questions regarding the procedure were encouraged and answered. The patient understands and consents to the procedure. Select axial scans through the pelvis were obtained. The mass was localized and an appropriate skin entry site was determined and marked. The operative field was prepped with chlorhexidinein a sterile fashion, and a sterile drape was applied covering the operative field. A sterile gown and sterile gloves were used for the procedure. Local anesthesia was provided with 1% Lidocaine. Under CT fluoroscopic guidance, a 17 gauge trocar needle was advanced to the margin of the lesion. Once needle tip position was confirmed, coaxial 18-gauge core biopsy samples  were obtained, submitted  in formalin to surgical pathology. The guide needle was removed. Postprocedure scans show no hemorrhage or other apparent complication. The patient tolerated the procedure well. COMPLICATIONS: None immediate FINDINGS: Large mass occupying most of the pelvis with central low-attenuation regions and gas, as noted on prior CT. Representative core biopsy samples of the peripheral right lower aspect of the lesion obtained without complication. IMPRESSION: 1. Technically successful CT-guided core biopsy,  pelvic mass Electronically Signed   By: Lucrezia Europe M.D.   On: 12/10/2016 16:36     Anti-infectives:   Anti-infectives    Start     Dose/Rate Route Frequency Ordered Stop   12/08/16 1700  cefTRIAXone (ROCEPHIN) 1 g in dextrose 5 % 50 mL IVPB     1 g 100 mL/hr over 30 Minutes Intravenous Every 24 hours 12/08/16 1606        Alphonsa Overall, MD, FACS Pager: [facility] Office: [phone] 12/11/2016

## 2016-12-11 NOTE — Progress Notes (Signed)
Daily Progress Note   Patient Name: Joanne Copeland       Date: 12/11/2016 DOB: 17-Feb-1941  Age: 76 y.o. MRN#: [id number] Attending Physician: Donne Hazel, MD Primary Care Physician: Myriam Jacobson, MD Admit Date: 12/08/2016  Reason for Consultation/Follow-up: Establishing goals of care r/t extensive likely recurrent fallopian cancer.   Subjective: Joanne Copeland appears to be feeling a little better than yesterday. She is looking at some old photos with a close friend.   Length of Stay: 3  Current Medications: Scheduled Meds:  . feeding supplement  1 Container Oral TID BM  . potassium chloride  40 mEq Oral Once  . sodium chloride flush  3 mL Intravenous Q12H    Continuous Infusions: . sodium chloride 125 mL/hr at 12/11/16 1247  . sodium chloride    . cefTRIAXone (ROCEPHIN)  IV Stopped (12/10/16 1651)    PRN Meds: sodium chloride, acetaminophen **OR** acetaminophen, iopamidol, lidocaine, morphine injection, ondansetron (ZOFRAN) IV, phenol, sodium chloride flush  Physical Exam  Constitutional: She is oriented to person, place, and time. She appears well-developed.  HENT:  Head: Normocephalic and atraumatic.  Cardiovascular: Normal rate.   Pulmonary/Chest: Effort normal. No accessory muscle usage. No tachypnea. No respiratory distress.  Abdominal: She exhibits distension. There is tenderness.  Neurological: She is alert and oriented to person, place, and time.  Nursing note and vitals reviewed.           Vital Signs: BP (!) 109/57 (BP Location: Right Arm)   Pulse 88   Temp 98.5 F (36.9 C) (Oral)   Resp 18   Ht '5\' 3"'$  (1.6 m)   Wt 77.3 kg (170 lb 6.7 oz)   SpO2 97%   BMI 30.19 kg/m  SpO2: SpO2: 97 % O2 Device: O2 Device: Nasal Cannula O2 Flow Rate: O2 Flow Rate  (L/min): 2 L/min  Intake/output summary:  Intake/Output Summary (Last 24 hours) at 12/11/16 1253 Last data filed at 12/11/16 0200  Gross per 24 hour  Intake             2553 ml  Output                0 ml  Net             2553 ml   LBM: Last BM Date: 12/09/16  Baseline Weight: Weight: 72.6 kg (160 lb) Most recent weight: Weight: 77.3 kg (170 lb 6.7 oz)       Palliative Assessment/Data:      Patient Active Problem List   Diagnosis Date Noted  . Abdominal pain   . Palliative care encounter   . Hypokalemia 12/08/2016  . Acute blood loss anemia 12/08/2016  . Thrombocytosis ([place]) 12/08/2016  . Serum total bilirubin elevated 12/08/2016  . High anion gap metabolic acidosis 70/48/8891  . Prolonged QT interval 12/08/2016  . Ascites 12/08/2016  . Pedal edema 12/08/2016  . Melena 12/08/2016  . 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 Antineoplastic chemotherapy induced anemia 06/05/2014  . Steroid-induced hyperglycemia 03/27/2014  . Fallopian tube cancer, carcinoma ([place]) 01/05/2014    Palliative Care Assessment & Plan   HPI: 76 y.o. female  with past medical history of fallopian tube cancer treated 3 years ago, HTN, HLD admitted on 12/08/2016 with constant incontinent black stools x 3-4 days. Also complains of weight loss, swelling in abd and legs, anxiety r/t trying to sell a home in [place] and having a husband with dementia. Unfortunately she was found to have extensive mass filling much of her lower abd and pelvis. Med onc and GYN onc has consulted and plan for biopsy 12/10/16. GI has been following for GIB had EGD 12/09/16 and blood loss thought to be from possible tumor infiltration of bowel but no source of bleeding evident on EGD. NGT has been placed for SBO from mass effect and for relief of nausea.   Assessment: I met today with Joanne Copeland and we were joined by Dr. Wyline Copas and then by her daughter and husband. After our discussion Ms.  Copeland adamantly wants NGT out and to eat. She names many foods that she wishes to have. She tells me that she has accepted her fate and has peace. She has a life review and tells me that she has been very blessed with a wonderful life.   Discussed plan with patient and again with daughter Bonnita Nasuti outside the room who is extremely supportive.   Recommendations/Plan: - D/C NGT - Comfort feeds - encouraged her to take this very slow - Plan for venting PEG via IR for comfort (she is decompressed currently but fear her mass will compress and cause recurring symptoms and discomfort) - [facility] (family/friends have already arranged this for her) with hospice - Roxanol 5 mg every 4 hours - she can d/c with this for pain.  - D/C with Zofran ODT prn.    Goals of Care and Additional Recommendations:  Limitations on Scope of Treatment: Full Comfort Care  Code Status:  DNR  Prognosis:   Depends on tolerance of diet (discussed this with daughter Bonnita Nasuti). Likely weeks.   Discharge Planning:  [place] with Hospice   Thank you for allowing the Palliative Medicine Team to assist in the care of this patient.   Total Time 16mn Prolonged Time Billed  no       Greater than 50%  of this time was spent counseling and coordinating care related to the above assessment and plan.  AVinie Sill NP Palliative Medicine Team Pager # [phone](M-F 8a-5p) Team Phone # [phone](Nights/Weekends)

## 2016-12-11 NOTE — Progress Notes (Signed)
Patient states that EOs slightly decreased her pain and that she appreciates the use of aromatherapy.

## 2016-12-11 NOTE — Consult Note (Signed)
Joanne Copeland is an 76 y.o. female.  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 She subsequently received 6 cycles of adjuvant chemotherapy. She received 6 cycles dose dense taxane/platin as of 11//2015. A CT scan in 12/15 did not show any findings suggestive of recurrence or metastatic disease. Since 1/18 she endorses the onset of abdominal distension. She has had significant stress caring for a partner with dementia and recent move from [place]. She also complains of loss of appetite, weight loss and fatigue which she attributes to stress. She denies any early satiety or N/V prior to this hospitalization. Several days ago she noted the onset of dark, loose stools. Yesterday she had 2 bouts of abdominal pain with associated vomiting. She last ate a meal 2 days ago; her last BM was yesterday. She reports flatus today. She was severely anemic on admission with a hemoglobin in the 6 range. She has been transfused with an appropriate rise in the hgb/hct. Labs were also consistent with a malnourished state and renal insufficiency. She has been febrile, ?bacteremic with blood cultures positive for Staphylococcus--possible contaminant. Imaging yesterday was remarkable for abdominal U/S that shows a large, 20+ abdominal/pelvic mass and bilateral hydronephrosis. Plain films of the abdomen showed small bowel loops with air/fluid levels.   On 12/09/16 she underwent CT abdomen and pelvis which showed a very large (30cm) abdominopelvic mass causing bilateral ureteral compression, GI compression, great vessel compression and likely erosion into small and or large intestine with possible fisulization (and gas within the central aspect of the mass).  She has an NGT in place - outputs not recorded but 400cc in cannister. She endorses passing both flatus and stools.   Her pain is well controlled with IV meds.  She is DNR. She is not a candidate for  chemotherapy, and is declining radical surgery.       Past Medical History:  Diagnosis Date  . Hypercholesterolemia   . Hypertension   . Ovarian cancer ([place])   . Pelvic mass         Past Surgical History:  Procedure Laterality Date  . omentectomy PPLND  01/05/2014  . radical tumor debulking  01/05/2014  . TOTAL ABDOMINAL HYSTERECTOMY W/ BILATERAL SALPINGOOPHORECTOMY  01/05/2014        Family History  Problem Relation Age of Onset  . Prostate cancer Brother   . Heart attack Father 13   Social History: reports that she has never smoked. She has never used smokeless tobacco. She reports that she drinks about 4.2 oz of alcohol per week . She reports that she does not use drugs.  Allergies:       Allergies  Allergen Reactions  . 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   . Latex Rash   Medications: I have reviewed the patient's current medications.  Imaging Results (Last 48 hours)     Review of Systems  Constitutional: Positive for fever, malaise/fatigue and weight loss.  Cardiovascular: Positive for leg swelling.  Gastrointestinal: Positive for abdominal pain, diarrhea, melena and vomiting.   BP (!) 109/57 (BP Location: Right Arm)   Pulse 88   Temp 98.5 F (36.9 C) (Oral)   Resp 18   Ht 5\' 3"  (1.6 m)   Wt 170 lb 6.7 oz (77.3 kg)   SpO2 97%   BMI 30.19 kg/m   Physical Exam  Constitutional: She is oriented to person, place,  and time.  Thin, non-toxic appearing, no distress, appears weak and fatigued. Sunken eyes and cheeks. HENT:  Head: Normocephalic.  Cardiovascular: Normal rate.  Respiratory: Breath sounds normal.  GI: She exhibits large distension with mass. Nontender.  Musculoskeletal: She exhibits edema.  Neurological: She is alert and oriented to person, place, and time.   Assessment/Plan:  76 yo with likely recurrent fallopian tube cancer in the form of a 30cm abdominopelvic mass with likely fistulization with small  and or large intestine.   We discussed options for therapy. Surgery to remove this mass would be a very radical procedure and involve likely resection of large amounts of small and large intestine, with substantial blood loss and postop convalescence. While technically possible, it is unlikely that she would do well from such a procedure given her sever malnutrition and general poor conditioning.  The patient is electing for palliative care and hospice. I feel that this is appropriate.  Her pain is well controlled at present (she feels "doped up" but not in pain).  Her main concern is the NGT. She is passing flatus and BM's, and it seems that NGT output over 48 hours has been 400cc. I believe that it is reasonable to consider removing the NGT to allow Joanne Copeland to comfort eat. However, if she has emesis after NGT removal, she is declining replacement. In such an event she could be considered for G tube for palliative decompression (which would remove the need for a nasogastric tube).    I recommend facilitation of hospice care after this hospitalization.  Everitt Amber, MD Cell: [phone]

## 2016-12-11 NOTE — Progress Notes (Signed)
Chief Complaint: Patient was seen in consultation today for palliative decompressive G-tube at the request of Dr. Marylu Lund  Referring Physician(s): Dr. Marylu Lund  Supervising Physician: Jacqulynn Cadet  Patient Status: [facility] - In-pt  History of Present Illness: Joanne Copeland is a 76 y.o. female with recurrent high grade carcinoma with large abdominal mass causing compression of multiple internal structures including the GI tract. She underwent biopsy of the mass yesterday, pathology reviewed. She has had an NGT the past 3-4 days but has been passing flatus and the tube was actually removed today and she has been started on soft diet. She is going to hospice/palliative care. It is unknown how much po intake she can tolerate and ultimately, it is assumed that she will develop complete bowel obstruction or gastric outlet obstruction. Therefore, IR is asked to place a palliative gastrostomy tube for decompressive purposes. The pt and her husband have thought this through and she is agreeable to this. Chart, imaging, meds, labs, allergies reviewed.   Past Medical History:  Diagnosis Date  . Hypercholesterolemia   . Hypertension   . Ovarian cancer ([place])   . Pelvic mass     Past Surgical History:  Procedure Laterality Date  . ESOPHAGOGASTRODUODENOSCOPY (EGD) WITH PROPOFOL N/A 12/09/2016   Procedure: ESOPHAGOGASTRODUODENOSCOPY (EGD) WITH PROPOFOL;  Surgeon: Ronald Lobo, MD;  Location: [facility];  Service: Endoscopy;  Laterality: N/A;  . omentectomy PPLND  01/05/2014  . radical tumor debulking  01/05/2014  . TOTAL ABDOMINAL HYSTERECTOMY W/ BILATERAL SALPINGOOPHORECTOMY  01/05/2014    Allergies: Food and Latex  Medications:  Current Facility-Administered Medications:  .  0.9 %  sodium chloride infusion, , Intravenous, Continuous, Isla Pence, MD, Last Rate: 125 mL/hr at 12/11/16 1247 .  0.9 %  sodium chloride infusion, 250 mL, Intravenous, PRN, Debbe Odea, MD .   acetaminophen (TYLENOL) tablet 650 mg, 650 mg, Oral, Q6H PRN **OR** acetaminophen (TYLENOL) suppository 650 mg, 650 mg, Rectal, Q6H PRN, Debbe Odea, MD .  cefTRIAXone (ROCEPHIN) 1 g in dextrose 5 % 50 mL IVPB, 1 g, Intravenous, Q24H, Debbe Odea, MD, Stopped at 12/10/16 1651 .  feeding supplement (BOOST / RESOURCE BREEZE) liquid 1 Container, 1 Container, Oral, TID BM, Debbe Odea, MD, 1 Container at 12/09/16 2000 .  iopamidol (ISOVUE-300) 61 % injection 15 mL, 15 mL, Oral, BID PRN, Maryann Mikhail, DO .  lidocaine (XYLOCAINE) 2 % jelly 1 application, 1 application, Other, X4J PRN, Donne Hazel, MD, 1 application at 28/78/67 1959 .  Morphine Sulfate (PF) SOLN 2 mg, 2 mg, Intravenous, Q4H PRN, Debbe Odea, MD, 2 mg at 12/11/16 1246 .  ondansetron (ZOFRAN) injection 4-8 mg, 4-8 mg, Intravenous, Q8H PRN, Ronald Lobo, MD, 8 mg at 12/08/16 1915 .  phenol (CHLORASEPTIC) mouth spray 1 spray, 1 spray, Mouth/Throat, PRN, Maryann Mikhail, DO, 1 spray at 12/10/16 1959 .  potassium chloride SA (K-DUR,KLOR-CON) CR tablet 40 mEq, 40 mEq, Oral, Once, Saima Rizwan, MD .  sodium chloride flush (NS) 0.9 % injection 3 mL, 3 mL, Intravenous, Q12H, Debbe Odea, MD, 3 mL at 12/11/16 0941 .  sodium chloride flush (NS) 0.9 % injection 3 mL, 3 mL, Intravenous, PRN, Debbe Odea, MD    Family History  Problem Relation Age of Onset  . Prostate cancer Brother   . Heart attack Father 51    Social History   Social History  . Marital status: Married    Spouse name: N/A  . Number of children: N/A  . Years  of education: N/A   Social History Main Topics  . Smoking status: Never Smoker  . Smokeless tobacco: Never Used  . Alcohol use 4.2 oz/week    7 Glasses of wine per week     Comment: " Glass of wine per night & a cocktail"  . Drug use: No  . Sexual activity: Not Currently   Other Topics Concern  . None   Social History Narrative  . None     Review of Systems: A 12 point ROS discussed and  pertinent positives are indicated in the HPI above.  All other systems are negative.  Review of Systems  Vital Signs: BP (!) 109/57 (BP Location: Right Arm)   Pulse 88   Temp 98.5 F (36.9 C) (Oral)   Resp 18   Ht 5\' 3"  (1.6 m)   Wt 170 lb 6.7 oz (77.3 kg)   SpO2 97%   BMI 30.19 kg/m   Physical Exam  Constitutional: She is oriented to person, place, and time. She appears well-developed. No distress.  HENT:  Head: Normocephalic.  Mouth/Throat: Oropharynx is clear and moist.  Neck: Normal range of motion. No tracheal deviation present.  Cardiovascular: Normal rate, regular rhythm and normal heart sounds.   Pulmonary/Chest: Effort normal and breath sounds normal. No respiratory distress.  Abdominal:  Palpable firm mass encompassing much of abdomen.   Neurological: She is alert and oriented to person, place, and time.  Skin: Skin is warm and dry.  Psychiatric: She has a normal mood and affect. Judgment normal.      Mallampati Score:  MD Evaluation Airway: WNL Heart: WNL Abdomen: Other (comments) Abdomen comments: Round with large palpable abdominal mass Chest/ Lungs: WNL ASA  Classification: 3 Mallampati/Airway Score: Two  Imaging: Ct Abdomen Pelvis Wo Contrast  Result Date: 12/09/2016 CLINICAL DATA:  76 year old female with a history of TAHBSO in 2015 with 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 Patient admitted with abdominal distention, bowel obstruction, bilateral hydronephrosis and large pelvic mass at sonography. EXAM: CT CHEST, ABDOMEN AND PELVIS WITHOUT CONTRAST TECHNIQUE: Multidetector CT imaging of the chest, abdomen and pelvis was performed following the standard protocol without IV contrast. COMPARISON:  Abdominal sonogram from earlier today. 06/05/2015 chest CT. 08/07/2014 CT abdomen/pelvis. FINDINGS: CT CHEST FINDINGS Cardiovascular: Normal heart size. Trace pericardial effusion/ thickening is not appreciably changed. Left anterior  descending coronary atherosclerosis. Right internal jugular MediPort terminates in the middle third of the superior vena cava. Atherosclerotic nonaneurysmal thoracic aorta. Normal caliber pulmonary arteries. Mediastinum/Nodes: No discrete thyroid nodules. Enteric tube terminates in the body of the stomach. Oral contrast in the mid to lower thoracic esophagus suggests mild gastroesophageal reflux. No pathologically enlarged axillary, mediastinal or gross hilar lymph nodes, noting limited sensitivity for the detection of hilar adenopathy on this noncontrast study. Lungs/Pleura: No pneumothorax. Trace dependent bilateral pleural effusions. Subsegmental compressive atelectasis in the dependent lower lobes bilaterally. Otherwise no acute consolidative airspace disease. No lung masses or significant pulmonary nodules in the aerated portions of the lungs. Musculoskeletal: No aggressive appearing focal osseous lesions. Mild thoracic spondylosis. CT ABDOMEN PELVIS FINDINGS Hepatobiliary: Normal liver with no liver mass. Mildly distended gallbladder. No radiopaque cholelithiasis. No definite gallbladder wall thickening. No pericholecystic fluid. No biliary ductal dilatation. Pancreas: Normal, with no mass or duct dilation. Spleen: Normal size. No mass. Adrenals/Urinary Tract: Normal adrenals. Moderate to marked bilateral hydroureteronephrosis to the level of the lower lumbar ureters bilaterally. No urolithiasis. Simple 1.6 cm renal cyst in the anterior interpolar right kidney.  Mixed density 2.5 x 2.3 cm renal cortical mass in the posterior upper right kidney (series 2/ image 58) with significant internal regions of macroscopic fat density, increased from 1.8 x 1.6 cm, compatible with a growing renal angiomyolipoma. Simple partially exophytic 1.5 cm renal cyst in the lateral lower left kidney. No additional contour deforming renal lesions. Compressed, nearly collapsed and grossly normal bladder. Stomach/Bowel: Collapsed and  grossly normal stomach noting enteric tube tip in the distal body of the stomach. There is a very large predominantly solid 30.9 x 20.2 x 23.9 cm mass filling much of the lower abdomen and pelvis (series 2/ image 82), which is new since 08/07/2014, which is continuous inferiorly with the vaginal cuff, which contains heterogeneous internal density including significant central regions of gas. This large mass is intimately associated with multiple small bowel loops and with the sigmoid colon. There is a suggestion of direct fistulization of the mass with the mid sigmoid lumen (series 5/images 59-61). The small bowel is decompressed with no significantly dilated small bowel loops. Oral contrast extends only to the mid small bowel. Distal small bowel loops are collapsed. Large bowel is relatively collapsed with mild stool throughout the large bowel. No regions of small or large bowel wall thickening. Vascular/Lymphatic: Atherosclerotic nonaneurysmal abdominal aorta. No pathologically enlarged lymph nodes in the abdomen or pelvis. Reproductive: Status post hysterectomy and bilateral oophorectomy. Other: No free intraperitoneal air. No focal fluid collections. There is compression of the common iliac veins bilaterally by the mass with anasarca throughout the pelvis. Musculoskeletal: No aggressive appearing focal osseous lesions. Mild lumbar spondylosis. IMPRESSION: 1. Very large predominantly solid 30.9 x 20.2 x 23.9 cm mass filling much of the lower abdomen and pelvis, continuous inferiorly with the vaginal cuff, intimately associated with multiple small bowel loops and with the sigmoid colon, with probable direct fistulization of the mass with the mid sigmoid lumen, with large amount of gas centrally within the mass. Findings are compatible with recurrent neoplasm. 2. No evidence of metastatic disease in the chest. No evidence of nodal, visceral or osseous metastatic disease. 3. Bilateral hydroureteronephrosis due to  distal lumbar ureteral obstruction by the mass. 4. Trace dependent bilateral pleural effusions. Compression of the common iliac veins by the mass with pelvic anasarca. 5. 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 6. Aortic atherosclerosis.  One vessel coronary atherosclerosis. Electronically Signed   By: Ilona Sorrel M.D.   On: 12/09/2016 20:59     Labs:  CBC:  Recent Labs  12/09/16 0611 12/09/16 1208 12/10/16 0530 12/11/16 0656  WBC 12.5* 13.3* 12.6* 10.5  HGB 9.7* 9.6* 9.1* 8.7*  HCT 30.1* 29.3* 27.8* 27.3*  PLT 384 370 370 337    COAGS:  Recent Labs  12/08/16 1253 12/09/16 0611  INR 1.11  --   APTT  --  32    BMP:  Recent Labs  12/08/16 2330 12/09/16 0611 12/10/16 0530 12/11/16 0656  NA 135 136 138 141  K 3.2* 3.5 3.7 3.5  CL 102 101 105 108  CO2 14* 17* 18* 17*  GLUCOSE 98 102* 92 86  BUN 26* 27* 26* 36*  CALCIUM 8.3* 8.3* 8.1* 8.3*  CREATININE 1.61* 1.60* 1.50* 1.69*  GFRNONAA 31* 31* 33* 29*  GFRAA 36* 36* 39* 34*    LIVER FUNCTION TESTS:  Recent Labs  12/08/16 1253 12/09/16 0611 12/10/16 0530  BILITOT 1.3* 1.3* 1.1  AST 25 26 25   ALT 7* 8* 8*  ALKPHOS  42 50 53  PROT 5.4* 5.7* 5.4*  ALBUMIN 2.5* 2.5* 2.2*    TUMOR MARKERS: No results for input(s): AFPTM, CEA, CA199, CHROMGRNA in the last 8760 hours.  Assessment and Plan: Large intraabdominal carcinoma causing partial bowel obstruction, expected to progress. Pt to go to Palliative/Hospice services. Imaging reviewed, small window for percutaneous access to place G-tube, but feel we could attempt with CT guidance. Ideally would have preferred if pt still had NGT so we could insufflate stomach during procedure. Pt understands we have to temporarily place small NG/OG tube during procedure if necessary. Risks and Benefits discussed with the patient including, but not limited to bleeding, infection, peritonitis, or damage to adjacent structures. All of the  patient's questions were answered, patient is agreeable to proceed. Consent signed and in chart.    Thank you for this interesting consult.  I greatly enjoyed meeting Joanne Copeland and look forward to participating in their care.  A copy of this report was sent to the requesting provider on this date.  Electronically Signed: Ascencion Dike 12/11/2016, 2:37 PM   I spent a total of 20 minutes in face to face in clinical consultation, greater than 50% of which was counseling/coordinating care for decompressive gastrostomy tube

## 2016-12-11 NOTE — Progress Notes (Signed)
1 drop each Lavender and Peppermint EO placed on 2x2 gauze at bedside and Provon lotion with 1 drop each Lavender and Peppermint EO applied to pt's L neck and posterior to L ear for pain relief r/t NG tube.

## 2016-12-12 ENCOUNTER — Inpatient Hospital Stay (HOSPITAL_COMMUNITY): Payer: Medicare Other

## 2016-12-12 ENCOUNTER — Encounter (HOSPITAL_COMMUNITY): Payer: Self-pay | Admitting: Radiology

## 2016-12-12 HISTORY — PX: IR FLUORO RM 30-60 MIN: IMG2384

## 2016-12-12 MED ORDER — FLUMAZENIL 0.5 MG/5ML IV SOLN
INTRAVENOUS | Status: AC
  Filled 2016-12-12: qty 5

## 2016-12-12 MED ORDER — MIDAZOLAM HCL 2 MG/2ML IJ SOLN
INTRAMUSCULAR | Status: AC | PRN
Start: 2016-12-12 — End: 2016-12-12
  Administered 2016-12-12 (×4): 0.5 mg via INTRAVENOUS

## 2016-12-12 MED ORDER — MORPHINE SULFATE (PF) 10 MG/ML IV SOLN
2.0000 mg | Freq: Once | INTRAVENOUS | Status: AC
  Administered 2016-12-12: 2 mg via INTRAVENOUS
  Filled 2016-12-12: qty 1

## 2016-12-12 MED ORDER — LIDOCAINE 1% INJECTION FOR CIRCUMCISION
INJECTION | INTRAVENOUS | Status: AC | PRN
  Administered 2016-12-12: 10 mL via SUBCUTANEOUS

## 2016-12-12 MED ORDER — MIDAZOLAM HCL 2 MG/2ML IJ SOLN
INTRAMUSCULAR | Status: AC
Start: 2016-12-12 — End: 2016-12-12
  Filled 2016-12-12: qty 6

## 2016-12-12 MED ORDER — GLUCAGON HCL RDNA (DIAGNOSTIC) 1 MG IJ SOLR
INTRAMUSCULAR | Status: AC
  Filled 2016-12-12: qty 1

## 2016-12-12 MED ORDER — GLUCAGON HCL RDNA (DIAGNOSTIC) 1 MG IJ SOLR
INTRAMUSCULAR | Status: AC | PRN
  Administered 2016-12-12: 1 mg via INTRAVENOUS

## 2016-12-12 MED ORDER — FENTANYL CITRATE (PF) 100 MCG/2ML IJ SOLN
INTRAMUSCULAR | Status: AC
  Filled 2016-12-12: qty 6

## 2016-12-12 MED ORDER — SODIUM CHLORIDE 0.9 % IV SOLN
INTRAVENOUS | Status: AC
  Filled 2016-12-12: qty 250

## 2016-12-12 MED ORDER — FENTANYL CITRATE (PF) 100 MCG/2ML IJ SOLN
INTRAMUSCULAR | Status: AC | PRN
  Administered 2016-12-12 (×4): 25 ug via INTRAVENOUS

## 2016-12-12 MED ORDER — NALOXONE HCL 0.4 MG/ML IJ SOLN
INTRAMUSCULAR | Status: AC
  Filled 2016-12-12: qty 1

## 2016-12-12 NOTE — Care Management Note (Signed)
Case Management Note  Patient Details  Name: Joanne Copeland MRN: [id number] Date of Birth: March 20, 1941  Subjective/Objective: Patient from [facility].Referral for home hospice-spoke to s/pouse/dtr Bonnita Nasuti in rm about d/c plans-dtr has already spoke to [employer] about SNF-they have a bed available.  I have notified CSW who will f/u w/dtr.                  Action/Plan:d/c plan SNF.   Expected Discharge Date:   (unknown)               Expected Discharge Plan:  Skilled Nursing Facility  In-House Referral:  Clinical Social Work  Discharge planning Services  CM Consult  Post Acute Care Choice:    Choice offered to:     DME Arranged:    DME Agency:     HH Arranged:    [place] Agency:     Status of Service:  In process, will continue to follow  If discussed at Long Length of Stay Meetings, dates discussed:    Additional Comments:  Dessa Phi, RN 12/12/2016, 12:58 PM

## 2016-12-12 NOTE — Progress Notes (Signed)
Patient ID: Joanne Copeland, female   DOB: Jan 14, 1941, 76 y.o.   MRN: [id number]  [facility] Surgery Progress Note  3 Days Post-Op  Subjective: CC- abdominal distension, abdominal mass Patient is in very good spirits this morning. She has 3 friends at bedside. States that her pain is well controlled. Denies n/v. Tolerated a peach yesterday. She is passing flatus. Scheduled for G-tube placement with IR today.  Objective: Vital signs in last 24 hours: Temp:  [98.2 F (36.8 C)-98.9 F (37.2 C)] 98.2 F (36.8 C) (04/27 0432) Pulse Rate:  [86-95] 95 (04/27 0432) Resp:  [18] 18 (04/27 0432) BP: (102-120)/(53-63) 120/63 (04/27 0432) SpO2:  [96 %-97 %] 97 % (04/27 0432) Weight:  [176 lb 5.9 oz (80 kg)] 176 lb 5.9 oz (80 kg) (04/27 0432) Last BM Date: 12/09/16  Intake/Output from previous day: 04/26 0701 - 04/27 0700 In: 3266.7 [P.O.:300; I.V.:2916.7; IV Piggyback:50] Out: 100 [Emesis/NG output:100] Intake/Output this shift: No intake/output data recorded.  PE: General:  Alert, chronically ill appearing, NAD but appears uncomfortable HEENT: pupils equal and round Lungs: Clear, effort normal Abdomen: well healed midline incision; firm and distended with large abdominal tumor; no focal tenderness with no rebound or guarding  Lab Results:   Recent Labs  12/10/16 0530 12/11/16 0656  WBC 12.6* 10.5  HGB 9.1* 8.7*  HCT 27.8* 27.3*  PLT 370 337   BMET  Recent Labs  12/10/16 0530 12/11/16 0656  NA 138 141  K 3.7 3.5  CL 105 108  CO2 18* 17*  GLUCOSE 92 86  BUN 26* 36*  CREATININE 1.50* 1.69*  CALCIUM 8.1* 8.3*   PT/INR No results for input(s): LABPROT, INR in the last 72 hours. CMP     Component Value Date/Time   NA 141 12/11/2016 0656   NA 141 08/07/2014 0858   K 3.5 12/11/2016 0656   K 4.0 08/07/2014 0858   CL 108 12/11/2016 0656   CO2 17 (L) 12/11/2016 0656   CO2 25 08/07/2014 0858   GLUCOSE 86 12/11/2016 0656   GLUCOSE 88 08/07/2014 0858   BUN 36 (H)  12/11/2016 0656   BUN 25.2 08/07/2014 0858   CREATININE 1.69 (H) 12/11/2016 0656   CREATININE 0.9 08/07/2014 0858   CALCIUM 8.3 (L) 12/11/2016 0656   CALCIUM 9.7 08/07/2014 0858   PROT 5.4 (L) 12/10/2016 0530   PROT 6.3 (L) 08/07/2014 0858   ALBUMIN 2.2 (L) 12/10/2016 0530   ALBUMIN 3.7 08/07/2014 0858   AST 25 12/10/2016 0530   AST 17 08/07/2014 0858   ALT 8 (L) 12/10/2016 0530   ALT 15 08/07/2014 0858   ALKPHOS 53 12/10/2016 0530   ALKPHOS 43 08/07/2014 0858   BILITOT 1.1 12/10/2016 0530   BILITOT 0.36 08/07/2014 0858   GFRNONAA 29 (L) 12/11/2016 0656   GFRAA 34 (L) 12/11/2016 0656   Lipase     Component Value Date/Time   LIPASE 17 12/08/2016 1253       Studies/Results: Ct Biopsy  Result Date: 12/10/2016 CLINICAL DATA:  History of fallopian tube cancer. Large pelvic mass. EXAM: CT GUIDED CORE BIOPSY OF PELVIC MASS ANESTHESIA/SEDATION: Intravenous Fentanyl and Versed were administered as conscious sedation during continuous monitoring of the patient's level of consciousness and physiological / cardiorespiratory status by the radiology RN, with a total moderate sedation time of 6 minutes. PROCEDURE: The procedure risks, benefits, and alternatives were explained to the patient. Questions regarding the procedure were encouraged and answered. The patient understands and consents to the procedure.  Select axial scans through the pelvis were obtained. The mass was localized and an appropriate skin entry site was determined and marked. The operative field was prepped with chlorhexidinein a sterile fashion, and a sterile drape was applied covering the operative field. A sterile gown and sterile gloves were used for the procedure. Local anesthesia was provided with 1% Lidocaine. Under CT fluoroscopic guidance, a 17 gauge trocar needle was advanced to the margin of the lesion. Once needle tip position was confirmed, coaxial 18-gauge core biopsy samples were obtained, submitted in formalin to  surgical pathology. The guide needle was removed. Postprocedure scans show no hemorrhage or other apparent complication. The patient tolerated the procedure well. COMPLICATIONS: None immediate FINDINGS: Large mass occupying most of the pelvis with central low-attenuation regions and gas, as noted on prior CT. Representative core biopsy samples of the peripheral right lower aspect of the lesion obtained without complication. IMPRESSION: 1. Technically successful CT-guided core biopsy,  pelvic mass Electronically Signed   By: Lucrezia Europe M.D.   On: 12/10/2016 16:36    Anti-infectives: Anti-infectives    Start     Dose/Rate Route Frequency Ordered Stop   12/08/16 1700  cefTRIAXone (ROCEPHIN) 1 g in dextrose 5 % 50 mL IVPB     1 g 100 mL/hr over 30 Minutes Intravenous Every 24 hours 12/08/16 1606         Assessment/Plan GI bleed with melena Massive abdominal tumor and bowel obstruction             - CT scan shows 30.9 x 20.2 x 23.9 cm predominantly solid mass in the lower abdomen and pelvis, continuous with vaginal cuff and associated with multiple small bowel loops and with the sigmoid colon; probable direct fistulization of the mass with the mid sigmoid lumen; No evidence of metastatic disease  - 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 She is hopefully getting a G-tube in IR today.  Unfortunately general surgery has nothing more  to offer this patient.  We will sign off, but please call back with any questions.   LOS: 4 days    Jerrye Beavers , [facility] Surgery 12/12/2016, 9:51 AM Pager: [phone] Consults: [phone] Mon-Fri 7:00 am-4:30 pm Sat-Sun 7:00 am-11:30 am  Agree with above.  Alphonsa Overall, MD, [facility] Pager: [phone] Office phone:  [phone]

## 2016-12-12 NOTE — Progress Notes (Signed)
PROGRESS NOTE    Joanne Copeland  XBJ:[id number] DOB: 01/09/1941 DOA: 12/08/2016 PCP: Myriam Jacobson, MD    Brief Narrative:  76 year old female history of fallopian tube cancer treated 3 years ago, hypertension, hyperlipidemia who presented with dark stools. Patient found to have a hemoglobin of 6.9. Patient was admitted for GI bleed and possible ascites. Ultrasound was done showing a huge pelvic mass. GI was consulted for EGD. Patient also now has possible hydronephrosis, small bowel obstruction. Gynecology oncology, oncology, general surgery, urology consulted.  Assessment & Plan:   Principal Problem:   Melena Active Problems:   Fallopian tube cancer, carcinoma (HCC)   Hypokalemia   Acute blood loss anemia   Thrombocytosis (HCC)   Serum total bilirubin elevated   High anion gap metabolic acidosis   Prolonged QT interval   Ascites   Pedal edema   Abdominal pain   Palliative care encounter   Pelvic mass   Goals of care, counseling/discussion  Pelvic Mass -With history of fallopian tube cancer 3 years ago -Patient noted to have marked distention on physical exam -Abdominal ultrasound showed large solid pelvic mass with maximum diameter of 28 cm, patient with bilateral moderate hydronephrosis, right angiomyolipoma  -Oncology, Dr. Alvy Bimler. Patient seen and case discussed with Dr. Alvy Bimler and Dr. Lucia Gaskins. Little to offer per Dr. Lucia Gaskins. Per Dr. Alvy Bimler, not candidate for chemo -IR guided biopsy obtained 4/25, pending results -Plan for focus on palliation. Discussed with patient and Palliative Care. Will remove NG today. -G-tube for decompression placed 4/27 by IR  GI bleed/acute blood loss anemia -Hemoglobin on admission 6.9 -Noted to be FOBT positive -Patient received 2 units PRBC, hemoglobin currently 9.1 -Gastroenterology consult and appreciated, pt underwent EGD 4/24. No significant lesions in upper GI tract -Currently stable  Small bowel obstruction with  nausea/vomiting -Abdominal x-ray showed small bowel dilatation with multiple air-fluid levels concerning for small bowel obstruction versus enteritis -NG removed for comfort -IR has placed g tube for palliation per above  Acute kidney injury/bilateral hydronephrosis -Likely secondary to pelvic mass -Baseline creatinine 1, upon admission, creatinine 1.63 -Urology consulted and appreciated -Discussed case with Dr. Tresa Moore. No plan for neph tubes or stents -focus on comfort at this time. Stable  Hypokalemia -Replaced - stable currently  Fever/Leukocytosis (SIRS vs Sepsis) -Likely secondary to the above -Blood culture showed no growth to date -Patient had been continued on empiric ceftriaxone -leukocytosis resolved. Pt afebrile  High anion gap metabolic acidosis -Lactic acid 1.4 -Suspect due to SBO -Currently at present  Thrombocytosis -Resolved -Stable currently  Pedal edema -Possibly related to the above -Echocardiogram was performed  Prolong QT  -Likely due to hypokalemia, continue telemetry monitoring -Stable currently  Hypoalbuminemia/weight loss -Likely secondary to metastatic disease -Will allow comfort feeds when no longer NPO per IR  DVT prophylaxis: Lovenox subQ Code Status: DNR Family Communication: Pt in room, family at bedside Disposition Plan: Uncertain at this time  Consultants:   Urology  IR  Oncology  Gyn Onc  GI  General Surgery  Procedures:   EGD 4/24  IR guided biopsy of mass 4/25  Antimicrobials: Anti-infectives    Start     Dose/Rate Route Frequency Ordered Stop   12/08/16 1700  cefTRIAXone (ROCEPHIN) 1 g in dextrose 5 % 50 mL IVPB     1 g 100 mL/hr over 30 Minutes Intravenous Every 24 hours 12/08/16 1606        Subjective: Eager to eat regular food  Objective: Vitals:   12/12/16 1236 12/12/16  1240 12/12/16 1246 12/12/16 1534  BP: (!) 108/52 (!) 108/52 (!) 103/49 (!) 99/51  Pulse: 95 94 95 87  Resp: (!) 21  (!) 23 16 16   Temp:    97.9 F (36.6 C)  TempSrc:    Oral  SpO2: 98% 99% 97% 97%  Weight:      Height:        Intake/Output Summary (Last 24 hours) at 12/12/16 1612 Last data filed at 12/12/16 3295  Gross per 24 hour  Intake          2026.67 ml  Output                0 ml  Net          2026.67 ml   Filed Weights   12/10/16 0500 12/11/16 0609 12/12/16 0432  Weight: 76.2 kg (167 lb 15.9 oz) 77.3 kg (170 lb 6.7 oz) 80 kg (176 lb 5.9 oz)    Examination:  General exam: laying in bed, in nad Respiratory system: normal chest rise, no wheezing on auscultation Cardiovascular system: regular rhythm, s1-2 Gastrointestinal system: firm, distended, mildly tender on deeper palpation Central nervous system: no seizures, no tremors Extremities: no cyanosis, no joint deformities Skin: normal skin turgor, no notable skin lesions seen Psychiatry: affect normal// no auditory hallucinations   Data Reviewed: I have personally reviewed following labs and imaging studies  CBC:  Recent Labs Lab 12/08/16 1253 12/08/16 2330 12/09/16 0611 12/09/16 1208 12/10/16 0530 12/11/16 0656  WBC 10.0 13.2* 12.5* 13.3* 12.6* 10.5  NEUTROABS 7.8*  --   --   --   --   --   HGB 6.9* 8.3* 9.7* 9.6* 9.1* 8.7*  HCT 21.5* 25.7* 30.1* 29.3* 27.8* 27.3*  MCV 82.7 82.4 83.4 83.0 83.2 83.7  PLT 419* 387 384 370 370 188   Basic Metabolic Panel:  Recent Labs Lab 12/08/16 1253 12/08/16 1549 12/08/16 2330 12/09/16 0611 12/10/16 0530 12/11/16 0656  NA 136  --  135 136 138 141  K 2.6*  --  3.2* 3.5 3.7 3.5  CL 101  --  102 101 105 108  CO2 17*  --  14* 17* 18* 17*  GLUCOSE 76  --  98 102* 92 86  BUN 25*  --  26* 27* 26* 36*  CREATININE 1.63*  --  1.61* 1.60* 1.50* 1.69*  CALCIUM 8.0*  --  8.3* 8.3* 8.1* 8.3*  MG  --  1.4*  --  1.4*  --   --    GFR: Estimated Creatinine Clearance: 29.7 mL/min (A) (by C-G formula based on SCr of 1.69 mg/dL (H)). Liver Function Tests:  Recent Labs Lab 12/08/16 1253  12/09/16 0611 12/10/16 0530  AST 25 26 25   ALT 7* 8* 8*  ALKPHOS 42 50 53  BILITOT 1.3* 1.3* 1.1  PROT 5.4* 5.7* 5.4*  ALBUMIN 2.5* 2.5* 2.2*    Recent Labs Lab 12/08/16 1253  LIPASE 17    Recent Labs Lab 12/08/16 1253  AMMONIA 16   Coagulation Profile:  Recent Labs Lab 12/08/16 1253  INR 1.11   Cardiac Enzymes:  Recent Labs Lab 12/08/16 1253  TROPONINI <0.03   BNP (last 3 results) No results for input(s): PROBNP in the last 8760 hours. HbA1C: No results for input(s): HGBA1C in the last 72 hours. CBG: No results for input(s): GLUCAP in the last 168 hours. Lipid Profile: No results for input(s): CHOL, HDL, LDLCALC, TRIG, CHOLHDL, LDLDIRECT in the last 72 hours. Thyroid Function Tests:  No results for input(s): TSH, T4TOTAL, FREET4, T3FREE, THYROIDAB in the last 72 hours. Anemia Panel: No results for input(s): VITAMINB12, FOLATE, FERRITIN, TIBC, IRON, RETICCTPCT in the last 72 hours. Sepsis Labs:  Recent Labs Lab 12/08/16 1551  LATICACIDVEN 1.4    Recent Results (from the past 240 hour(s))  Culture, blood (Routine X 2) w Reflex to ID Panel     Status: Abnormal   Collection Time: 12/08/16  4:06 PM  Result Value Ref Range Status   Specimen Description LEFT ANTECUBITAL  Final   Special Requests   Final    BOTTLES DRAWN AEROBIC AND ANAEROBIC Blood Culture adequate volume   Culture  Setup Time   Final    GRAM POSITIVE COCCI IN CLUSTERS AEROBIC BOTTLE ONLY CRITICAL RESULT CALLED TO, READ BACK BY AND VERIFIED WITH: Melodye Ped Pharm.D. 17:10 12/09/16 ([email or web address])    Culture (A)  Final    STAPHYLOCOCCUS SPECIES (COAGULASE NEGATIVE) THE SIGNIFICANCE OF ISOLATING THIS ORGANISM FROM A SINGLE SET OF BLOOD CULTURES WHEN MULTIPLE SETS ARE DRAWN IS UNCERTAIN. PLEASE NOTIFY THE MICROBIOLOGY DEPARTMENT WITHIN ONE WEEK IF SPECIATION AND SENSITIVITIES ARE REQUIRED. Performed at [facility], [place] [address], [place], [place] [address]    Report Status 12/10/2016  FINAL  Final  Blood Culture ID Panel (Reflexed)     Status: Abnormal   Collection Time: 12/08/16  4:06 PM  Result Value Ref Range Status   Enterococcus species NOT DETECTED NOT DETECTED Final   Listeria monocytogenes NOT DETECTED NOT DETECTED Final   Staphylococcus species DETECTED (A) NOT DETECTED Final    Comment: Methicillin (oxacillin) susceptible coagulase negative staphylococcus. Possible blood culture contaminant (unless isolated from more than one blood culture draw or clinical case suggests pathogenicity). No antibiotic treatment is indicated for blood  culture contaminants. CRITICAL RESULT CALLED TO, READ BACK BY AND VERIFIED WITH: J. Scherrie November Pharm.D. 17:10 12/09/16 ([email or web address])    Staphylococcus aureus NOT DETECTED NOT DETECTED Final   Methicillin resistance NOT DETECTED NOT DETECTED Final   Streptococcus species NOT DETECTED NOT DETECTED Final   Streptococcus agalactiae NOT DETECTED NOT DETECTED Final   Streptococcus pneumoniae NOT DETECTED NOT DETECTED Final   Streptococcus pyogenes NOT DETECTED NOT DETECTED Final   Acinetobacter baumannii NOT DETECTED NOT DETECTED Final   Enterobacteriaceae species NOT DETECTED NOT DETECTED Final   Enterobacter cloacae complex NOT DETECTED NOT DETECTED Final   Escherichia coli NOT DETECTED NOT DETECTED Final   Klebsiella oxytoca NOT DETECTED NOT DETECTED Final   Klebsiella pneumoniae NOT DETECTED NOT DETECTED Final   Proteus species NOT DETECTED NOT DETECTED Final   Serratia marcescens NOT DETECTED NOT DETECTED Final   Haemophilus influenzae NOT DETECTED NOT DETECTED Final   Neisseria meningitidis NOT DETECTED NOT DETECTED Final   Pseudomonas aeruginosa NOT DETECTED NOT DETECTED Final   Candida albicans NOT DETECTED NOT DETECTED Final   Candida glabrata NOT DETECTED NOT DETECTED Final   Candida krusei NOT DETECTED NOT DETECTED Final   Candida parapsilosis NOT DETECTED NOT DETECTED Final   Candida tropicalis NOT DETECTED NOT DETECTED Final     Comment: Performed at [facility], [place] [address] [place], [place] [address]  Culture, blood (Routine X 2) w Reflex to ID Panel     Status: None (Preliminary result)   Collection Time: 12/08/16  4:10 PM  Result Value Ref Range Status   Specimen Description BLOOD RIGHT ANTECUBITAL  Final   Special Requests   Final    BOTTLES DRAWN AEROBIC AND ANAEROBIC Blood  Culture adequate volume   Culture   Final    NO GROWTH 4 DAYS Performed at [facility], [place] [address] [place], [place] [address]    Report Status PENDING  Incomplete  MRSA PCR Screening     Status: None   Collection Time: 12/08/16  9:49 PM  Result Value Ref Range Status   MRSA by PCR NEGATIVE NEGATIVE Final    Comment:        The GeneXpert MRSA Assay (FDA approved for NASAL specimens only), is one component of a comprehensive MRSA colonization surveillance program. It is not intended to diagnose MRSA infection nor to guide or monitor treatment for MRSA infections.      Radiology Studies: No results found.  Scheduled Meds: . feeding supplement  1 Container Oral TID BM  . potassium chloride  40 mEq Oral Once  . sodium chloride flush  3 mL Intravenous Q12H   Continuous Infusions: . sodium chloride 125 mL/hr at 12/12/16 1311  . sodium chloride    . cefTRIAXone (ROCEPHIN)  IV Stopped (12/11/16 1819)     LOS: 4 days   Kaien Pezzullo, Orpah Melter, MD [facility] Pager [phone]  If 7PM-7AM, please contact night-coverage [email or web address] Password [facility] 12/12/2016, 4:12 PM

## 2016-12-12 NOTE — Care Management Important Message (Signed)
Important Message  Patient Details  Name: Joanne Copeland MRN: [id number] Date of Birth: [birth year]   Medicare Important Message Given:  Yes    Kerin Salen 12/12/2016, 10:40 AMImportant Message  Patient Details  Name: Joanne Copeland MRN: [id number] Date of Birth: [birth year]   Medicare Important Message Given:  Yes    Kerin Salen 12/12/2016, 10:40 AM

## 2016-12-12 NOTE — Procedures (Signed)
Interventional Radiology Procedure Note  Procedure: Placement of percutaneous 1F Wills-Ogelsby g-tube for gastric decompression Complications: None Recommendations: - NPO except for sips and chips remainder of today and overnight - Maintain G-tube to LWS until tomorrow morning  - May advance diet as tolerated and begin using tube tomorrow morning  Signed,  Criselda Peaches, MD

## 2016-12-12 NOTE — Progress Notes (Signed)
Pt. Had a small bright red BM. On call NP paged and made aware. Will continue to monitor.

## 2016-12-12 NOTE — NC FL2 (Signed)
[place] MEDICAID FL2 LEVEL OF CARE SCREENING TOOL     IDENTIFICATION  Patient Name: Joanne Copeland Birthdate: [birth year] Sex: female Admission Date (Current Location): 12/08/2016  [facility] and [place] Number:  Herbalist and Address:  [facility],  [place], [place]      Provider Number: [id number]  Attending Physician Name and Address:  Donne Hazel, MD  Relative Name and Phone Number:       Current Level of Care: Hospital Recommended Level of Care: [place] Prior Approval Number:    Date Approved/Denied:   PASRR Number: [id number]  Discharge Plan: SNF    Current Diagnoses: Patient Active Problem List   Diagnosis Date Noted  . Pelvic mass   . Goals of care, counseling/discussion   . Abdominal pain   . Palliative care encounter   . Hypokalemia 12/08/2016  . Acute blood loss anemia 12/08/2016  . Thrombocytosis ([place]) 12/08/2016  . Serum total bilirubin elevated 12/08/2016  . High anion gap metabolic acidosis 03/54/6568  . Prolonged QT interval 12/08/2016  . Ascites 12/08/2016  . Pedal edema 12/08/2016  . Melena 12/08/2016  . 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 Antineoplastic chemotherapy induced anemia 06/05/2014  . Steroid-induced hyperglycemia 03/27/2014  . Fallopian tube cancer, carcinoma ([place]) 01/05/2014    Orientation RESPIRATION BLADDER Height & Weight     Self, Time, Situation, Place  Normal Incontinent Weight: 176 lb 5.9 oz (80 kg) Height:  5\' 3"  (160 cm)  BEHAVIORAL SYMPTOMS/MOOD NEUROLOGICAL BOWEL NUTRITION STATUS      Incontinent Diet (see DC summary)  AMBULATORY STATUS COMMUNICATION OF NEEDS Skin   Extensive Assist Verbally Normal                       Personal Care Assistance Level of Assistance  Bathing, Dressing Bathing Assistance: Maximum assistance   Dressing Assistance: Maximum assistance     Functional Limitations Info              SPECIAL CARE FACTORS FREQUENCY                       Contractures      Additional Factors Info  Code Status, Allergies Code Status Info: DNR Allergies Info: Food, Latex           Current Medications (12/12/2016):  This is the current hospital active medication list Current Facility-Administered Medications  Medication Dose Route Frequency Provider Last Rate Last Dose  . 0.9 %  sodium chloride infusion   Intravenous Continuous Isla Pence, MD 125 mL/hr at 12/12/16 1311    . 0.9 %  sodium chloride infusion  250 mL Intravenous PRN Debbe Odea, MD      . acetaminophen (TYLENOL) tablet 650 mg  650 mg Oral Q6H PRN Debbe Odea, MD       Or  . acetaminophen (TYLENOL) suppository 650 mg  650 mg Rectal Q6H PRN Debbe Odea, MD      . cefTRIAXone (ROCEPHIN) 1 g in dextrose 5 % 50 mL IVPB  1 g Intravenous Q24H Debbe Odea, MD 100 mL/hr at 12/12/16 1621 1 g at 12/12/16 1621  . feeding supplement (BOOST / RESOURCE BREEZE) liquid 1 Container  1 Container Oral TID BM Debbe Odea, MD   1 Container at 12/11/16 1400  . iopamidol (ISOVUE-300) 61 % injection 15 mL  15 mL Oral BID PRN Maryann Mikhail, DO      .  lidocaine (XYLOCAINE) 2 % jelly 1 application  1 application Other V0J PRN Donne Hazel, MD   1 application at 50/09/38 1959  . morphine CONCENTRATE 10 MG/0.5ML oral solution 5 mg  5 mg Sublingual H8E PRN Pershing Proud, NP       Or  . Morphine Sulfate (PF) SOLN 2 mg  2 mg Intravenous X9B PRN Pershing Proud, NP   2 mg at 12/12/16 1626  . ondansetron (ZOFRAN) injection 4-8 mg  4-8 mg Intravenous Q8H PRN Ronald Lobo, MD   8 mg at 12/08/16 1915  . phenol (CHLORASEPTIC) mouth spray 1 spray  1 spray Mouth/Throat PRN Cristal Ford, DO   1 spray at 12/10/16 1959  . potassium chloride SA (K-DUR,KLOR-CON) CR tablet 40 mEq  40 mEq Oral Once Debbe Odea, MD      . sodium chloride flush (NS) 0.9 % injection 3 mL  3 mL Intravenous Q12H Debbe Odea, MD   3 mL at 12/11/16  2122  . sodium chloride flush (NS) 0.9 % injection 3 mL  3 mL Intravenous PRN Debbe Odea, MD         Discharge Medications: Please see discharge summary for a list of discharge medications.  Relevant Imaging Results:  Relevant Lab Results:   Additional Information SS#: [id number], pt has closed system drain 1 anterior stomach 12 Fr, will need hospice to follow at SNF  Valor Quaintance, Connye Burkitt, LCSW

## 2016-12-13 DIAGNOSIS — K311 Adult hypertrophic pyloric stenosis: Secondary | ICD-10-CM

## 2016-12-13 DIAGNOSIS — Z515 Encounter for palliative care: Secondary | ICD-10-CM

## 2016-12-13 DIAGNOSIS — R1011 Right upper quadrant pain: Secondary | ICD-10-CM

## 2016-12-13 LAB — CULTURE, BLOOD (ROUTINE X 2)
Culture: NO GROWTH
SPECIAL REQUESTS: ADEQUATE

## 2016-12-13 MED ORDER — DEXAMETHASONE SODIUM PHOSPHATE 10 MG/ML IJ SOLN
6.0000 mg | Freq: Once | INTRAMUSCULAR | Status: DC
Start: 2016-12-13 — End: 2016-12-13

## 2016-12-13 MED ORDER — MORPHINE SULFATE (PF) 10 MG/ML IV SOLN
2.0000 mg | Freq: Once | INTRAVENOUS | Status: AC
  Administered 2016-12-13: 2 mg via INTRAVENOUS
  Filled 2016-12-13: qty 1

## 2016-12-13 MED ORDER — OXYCODONE HCL 5 MG/5ML PO SOLN
5.0000 mg | Freq: Once | ORAL | Status: DC
  Filled 2016-12-13: qty 5

## 2016-12-13 MED ORDER — MORPHINE SULFATE (CONCENTRATE) 10 MG/0.5ML PO SOLN
5.0000 mg | ORAL | Status: AC
  Administered 2016-12-13: 5 mg via ORAL
  Filled 2016-12-13: qty 0.5

## 2016-12-13 MED ORDER — MORPHINE SULFATE (CONCENTRATE) 10 MG/0.5ML PO SOLN: 5.0000 mg | ORAL | 0 refills | Status: DC | PRN

## 2016-12-13 MED ORDER — MORPHINE SULFATE (PF) 10 MG/ML IV SOLN
2.0000 mg | INTRAVENOUS | Status: DC | PRN
  Administered 2016-12-14: 4 mg via INTRAVENOUS
  Filled 2016-12-13: qty 1

## 2016-12-13 MED ORDER — DEXAMETHASONE 4 MG PO TABS: 4.0000 mg | ORAL_TABLET | Freq: Two times a day (BID) | ORAL | Status: DC

## 2016-12-13 MED ORDER — MORPHINE SULFATE (CONCENTRATE) 10 MG/0.5ML PO SOLN
10.0000 mg | ORAL | Status: DC | PRN
  Administered 2016-12-13 – 2016-12-14 (×4): 10 mg via SUBLINGUAL
  Filled 2016-12-13 (×4): qty 0.5

## 2016-12-13 MED ORDER — MORPHINE SULFATE (PF) 4 MG/ML IV SOLN: 2.0000 mg | INTRAVENOUS | Status: DC | PRN

## 2016-12-13 MED ORDER — BOOST / RESOURCE BREEZE PO LIQD: 1.0000 | Freq: Three times a day (TID) | ORAL | 0 refills | Status: AC

## 2016-12-13 MED ORDER — MORPHINE SULFATE (PF) 10 MG/ML IV SOLN
4.0000 mg | INTRAVENOUS | Status: AC
  Administered 2016-12-13: 4 mg via INTRAVENOUS
  Filled 2016-12-13: qty 1

## 2016-12-13 MED ORDER — MORPHINE SULFATE (CONCENTRATE) 10 MG/0.5ML PO SOLN: 5.0000 mg | ORAL | Status: DC | PRN

## 2016-12-13 MED ORDER — ONDANSETRON HCL 4 MG PO TABS: 4.0000 mg | ORAL_TABLET | Freq: Three times a day (TID) | ORAL | 0 refills | Status: AC | PRN

## 2016-12-13 NOTE — Progress Notes (Signed)
Palliative care progress note  Reason for consult: Goals of care, disposition, uncontrolled pain  Joanne Copeland is an unfortunate 76 year old woman with advanced cancer who is s/p venting PEG tube with plan to transition to SNF with hospice support.  I received call from attending that she has been having increase in pain today.  On examination, she was moaning and reports that pain is severe, she is nauseous, and begging "please help me."  Diffuse abdominal tenderness on exam.  She has been receiving morphine concentrate 5mg  every 4 hours, but had required additional doses prior to the 4 hour mark to control her pain.  She has also had PEG tube disconnected from suction and plugged to see how she tolerates.  In the past couple of hours, pain has escalated significantly.  I called and discussed with Dr. Wyline Copas.  Discharge cancelled today.    Plan: 1) Morphine 4mg  IV now 2) Increase morphine concentrate to 10-15mg  every 2 hours as needed. 3) Consider addition of steroids, however with recent GI bleed, will see if pain can be controlled with increased narcotic regimen. 4) Reconnect g-tube to suction until pain controlled.  At that point, I would connect to bag to drainage and leave open to drain unless she gets medications.  Would clamp bag for 30 minutes after meds and then reopen.  Eventually, she may tolerate clamping of tube and just opening to vent when bloated or pain starts.  I would just leave connected to bag to gravity for now. 5) Plan to re-evaluate in AM.  I am concerned that this acute change may be transitioning to actively dying.  If she continues to decline, would consider residential hospice placement rather than SNF with hospice. 6) Will f/u tomorrow.  Total Time: 45 minutes Greater than 50%  of this time was spent counseling and coordinating care related to the above assessment and plan.  Joanne Rough, MD [facility] [phone]

## 2016-12-13 NOTE — Clinical Social Work Note (Signed)
Patient was scheduled for d/c today to [facility]. DC cancelled by MD due to medical issues. Nursing and family aware. Notified Kelly- admissions at [facility].  Lorie Phenix. Pauline Good, [place]

## 2016-12-13 NOTE — Progress Notes (Signed)
Referring Physician(s):  Dr. Veverly Fells  Supervising Physician: Markus Daft  Patient Status:  [facility] - In-pt  Chief Complaint:  Gastric distention  Subjective: Resting comfortably.  Feeling better.  Ready for discharge.   Allergies: Food and Latex  Medications: Prior to Admission medications   Medication Sig Start Date End Date Taking? Authorizing Provider  amLODipine (NORVASC) 5 MG tablet Take 5 mg by mouth at bedtime.   Yes Historical Provider, MD  aspirin EC 325 MG tablet Take 325 mg by mouth at bedtime.   Yes Historical Provider, MD  cholecalciferol (VITAMIN D) 1000 UNITS tablet Take 1,000 Units by mouth daily.   Yes Historical Provider, MD  folic acid (FOLVITE) 1 MG tablet Take 1 mg by mouth daily.   Yes Historical Provider, MD  lactobacillus acidophilus (BACID) TABS tablet Take 1 tablet by mouth daily.   Yes Historical Provider, MD  omega-3 acid ethyl esters (LOVAZA) 1 g capsule Take 1 g by mouth daily.   Yes Historical Provider, MD  pravastatin (PRAVACHOL) 10 MG tablet Take 10 mg by mouth at bedtime.    Yes Historical Provider, MD  Prenatal Vit-Fe Fumarate-FA (PRENATAL MULTIVITAMIN) TABS tablet Take 1 tablet by mouth daily.    Yes Historical Provider, MD  sennosides-docusate sodium (SENOKOT-S) 8.6-50 MG tablet Take 1-2 tablets by mouth 2 (two) times daily as needed for constipation.    Yes Lennis Marion Downer, MD  vitamin B-12 (CYANOCOBALAMIN) 1000 MCG tablet Take 1,000 mcg by mouth daily.   Yes Historical Provider, MD  Vitamin E 100 UNITS TABS Take 100 Units by mouth daily.    Yes Historical Provider, MD  feeding supplement (BOOST / RESOURCE BREEZE) LIQD Take 1 Container by mouth 3 (three) times daily between meals. 12/13/16   Donne Hazel, MD  Morphine Sulfate (MORPHINE CONCENTRATE) 10 MG/0.5ML SOLN concentrated solution Place 0.25 mLs (5 mg total) under the tongue every 4 (four) hours as needed for severe pain or shortness of breath. 12/13/16   Donne Hazel, MD  ondansetron  (ZOFRAN) 4 MG tablet Take 1 tablet (4 mg total) by mouth every 8 (eight) hours as needed for nausea or vomiting. 12/13/16   Donne Hazel, MD     Vital Signs: BP (!) 101/52 (BP Location: Right Arm)   Pulse 87   Temp 97.5 F (36.4 C) (Oral)   Resp 16   Ht 5\' 3"  (1.6 m)   Wt 183 lb 3.2 oz (83.1 kg)   SpO2 99%   BMI 32.45 kg/m   Physical Exam  Constitutional: She appears well-developed.  Abdominal: She exhibits distension. There is no tenderness.  G-tube in place.  Insertion site clean, dry. Secured in place.   Nursing note and vitals reviewed.   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 Patient admitted with abdominal distention, bowel obstruction, bilateral hydronephrosis and large pelvic mass at sonography. EXAM: CT CHEST, ABDOMEN AND PELVIS WITHOUT CONTRAST TECHNIQUE: Multidetector CT imaging of the chest, abdomen and pelvis was performed following the standard protocol without IV contrast. COMPARISON:  Abdominal sonogram from earlier today. 06/05/2015 chest CT. 08/07/2014 CT abdomen/pelvis. FINDINGS: CT CHEST FINDINGS Cardiovascular: Normal heart size. Trace pericardial effusion/ thickening is not appreciably changed. Left anterior descending coronary atherosclerosis. Right internal jugular MediPort terminates in the middle third of the superior vena cava. Atherosclerotic nonaneurysmal thoracic aorta. Normal caliber pulmonary  arteries. Mediastinum/Nodes: No discrete thyroid nodules. Enteric tube terminates in the body of the stomach. Oral contrast in the mid to lower thoracic esophagus suggests mild gastroesophageal reflux. No pathologically enlarged axillary, mediastinal or gross hilar lymph nodes, noting limited sensitivity for the detection of hilar adenopathy on this noncontrast study. Lungs/Pleura: No pneumothorax. Trace  dependent bilateral pleural effusions. Subsegmental compressive atelectasis in the dependent lower lobes bilaterally. Otherwise no acute consolidative airspace disease. No lung masses or significant pulmonary nodules in the aerated portions of the lungs. Musculoskeletal: No aggressive appearing focal osseous lesions. Mild thoracic spondylosis. CT ABDOMEN PELVIS FINDINGS Hepatobiliary: Normal liver with no liver mass. Mildly distended gallbladder. No radiopaque cholelithiasis. No definite gallbladder wall thickening. No pericholecystic fluid. No biliary ductal dilatation. Pancreas: Normal, with no mass or duct dilation. Spleen: Normal size. No mass. Adrenals/Urinary Tract: Normal adrenals. Moderate to marked bilateral hydroureteronephrosis to the level of the lower lumbar ureters bilaterally. No urolithiasis. Simple 1.6 cm renal cyst in the anterior interpolar right kidney. Mixed density 2.5 x 2.3 cm renal cortical mass in the posterior upper right kidney (series 2/ image 58) with significant internal regions of macroscopic fat density, increased from 1.8 x 1.6 cm, compatible with a growing renal angiomyolipoma. Simple partially exophytic 1.5 cm renal cyst in the lateral lower left kidney. No additional contour deforming renal lesions. Compressed, nearly collapsed and grossly normal bladder. Stomach/Bowel: Collapsed and grossly normal stomach noting enteric tube tip in the distal body of the stomach. There is a very large predominantly solid 30.9 x 20.2 x 23.9 cm mass filling much of the lower abdomen and pelvis (series 2/ image 82), which is new since 08/07/2014, which is continuous inferiorly with the vaginal cuff, which contains heterogeneous internal density including significant central regions of gas. This large mass is intimately associated with multiple small bowel loops and with the sigmoid colon. There is a suggestion of direct fistulization of the mass with the mid sigmoid lumen (series 5/images 59-61).  The small bowel is decompressed with no significantly dilated small bowel loops. Oral contrast extends only to the mid small bowel. Distal small bowel loops are collapsed. Large bowel is relatively collapsed with mild stool throughout the large bowel. No regions of small or large bowel wall thickening. Vascular/Lymphatic: Atherosclerotic nonaneurysmal abdominal aorta. No pathologically enlarged lymph nodes in the abdomen or pelvis. Reproductive: Status post hysterectomy and bilateral oophorectomy. Other: No free intraperitoneal air. No focal fluid collections. There is compression of the common iliac veins bilaterally by the mass with anasarca throughout the pelvis. Musculoskeletal: No aggressive appearing focal osseous lesions. Mild lumbar spondylosis. IMPRESSION: 1. Very large predominantly solid 30.9 x 20.2 x 23.9 cm mass filling much of the lower abdomen and pelvis, continuous inferiorly with the vaginal cuff, intimately associated with multiple small bowel loops and with the sigmoid colon, with probable direct fistulization of the mass with the mid sigmoid lumen, with large amount of gas centrally within the mass. Findings are compatible with recurrent neoplasm. 2. No evidence of metastatic disease in the chest. No evidence of nodal, visceral or osseous metastatic disease. 3. Bilateral hydroureteronephrosis due to distal lumbar ureteral obstruction by the mass. 4. Trace dependent bilateral pleural effusions. Compression of the common iliac veins by the mass with pelvic anasarca. 5. 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 6. Aortic atherosclerosis.  One vessel coronary atherosclerosis. Electronically Signed   By: Ilona Sorrel M.D.   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 Patient admitted with abdominal  distention, bowel obstruction, bilateral hydronephrosis and large pelvic mass at sonography. EXAM: CT CHEST, ABDOMEN AND PELVIS WITHOUT CONTRAST TECHNIQUE: Multidetector CT imaging of the chest, abdomen and pelvis was performed following the standard protocol without IV contrast. COMPARISON:  Abdominal sonogram from earlier today. 06/05/2015 chest CT. 08/07/2014 CT abdomen/pelvis. FINDINGS: CT CHEST FINDINGS Cardiovascular: Normal heart size. Trace pericardial effusion/ thickening is not appreciably changed. Left anterior descending coronary atherosclerosis. Right internal jugular MediPort terminates in the middle third of the superior vena cava. Atherosclerotic nonaneurysmal thoracic aorta. Normal caliber pulmonary arteries. Mediastinum/Nodes: No discrete thyroid nodules. Enteric tube terminates in the body of the stomach. Oral contrast in the mid to lower thoracic esophagus suggests mild gastroesophageal reflux. No pathologically enlarged axillary, mediastinal or gross hilar lymph nodes, noting limited sensitivity for the detection of hilar adenopathy on this noncontrast study. Lungs/Pleura: No pneumothorax. Trace dependent bilateral pleural effusions. Subsegmental compressive atelectasis in the dependent lower lobes bilaterally. Otherwise no acute consolidative airspace disease. No lung masses or significant pulmonary nodules in the aerated portions of the lungs. Musculoskeletal: No aggressive appearing focal osseous lesions. Mild thoracic spondylosis. CT ABDOMEN PELVIS FINDINGS Hepatobiliary: Normal liver with no liver mass. Mildly distended gallbladder. No radiopaque cholelithiasis. No definite gallbladder wall thickening. No pericholecystic fluid. No biliary ductal dilatation. Pancreas: Normal, with no mass or duct dilation. Spleen: Normal size. No mass. Adrenals/Urinary Tract: Normal adrenals. Moderate to marked bilateral hydroureteronephrosis to the level of the lower lumbar ureters bilaterally. No  urolithiasis. Simple 1.6 cm renal cyst in the anterior interpolar right kidney. Mixed density 2.5 x 2.3 cm renal cortical mass in the posterior upper right kidney (series 2/ image 58) with significant internal regions of macroscopic fat density, increased from 1.8 x 1.6 cm, compatible with a growing renal angiomyolipoma. Simple partially exophytic 1.5 cm renal cyst in the lateral lower left kidney. No additional contour deforming renal lesions. Compressed, nearly collapsed and grossly normal bladder. Stomach/Bowel: Collapsed and grossly normal stomach noting enteric tube tip in the distal body of the stomach. There is a very large predominantly solid 30.9 x 20.2 x 23.9 cm mass filling much of the lower abdomen and pelvis (series 2/ image 82), which is new since 08/07/2014, which is continuous inferiorly with the vaginal cuff, which contains heterogeneous internal density including significant central regions of gas. This large mass is intimately associated with multiple small bowel loops and with the sigmoid colon. There is a suggestion of direct fistulization of the mass with the mid sigmoid lumen (series 5/images 59-61). The small bowel is decompressed with no significantly dilated small bowel loops. Oral contrast extends only to the mid small bowel. Distal small bowel loops are collapsed. Large bowel is relatively collapsed with mild stool throughout the large bowel. No regions of small or large bowel wall thickening. Vascular/Lymphatic: Atherosclerotic nonaneurysmal abdominal aorta. No pathologically enlarged lymph nodes in the abdomen or pelvis. Reproductive: Status post hysterectomy and bilateral oophorectomy. Other: No free intraperitoneal air. No focal fluid collections. There is compression of the common iliac veins bilaterally by the mass with anasarca throughout the pelvis. Musculoskeletal: No aggressive appearing focal osseous lesions. Mild lumbar spondylosis. IMPRESSION: 1. Very large predominantly  solid 30.9 x 20.2 x 23.9 cm mass filling much of the lower abdomen and pelvis, continuous inferiorly with the vaginal cuff, intimately associated with multiple  small bowel loops and with the sigmoid colon, with probable direct fistulization of the mass with the mid sigmoid lumen, with large amount of gas centrally within the mass. Findings are compatible with recurrent neoplasm. 2. No evidence of metastatic disease in the chest. No evidence of nodal, visceral or osseous metastatic disease. 3. Bilateral hydroureteronephrosis due to distal lumbar ureteral obstruction by the mass. 4. Trace dependent bilateral pleural effusions. Compression of the common iliac veins by the mass with pelvic anasarca. 5. 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 6. Aortic atherosclerosis.  One vessel coronary atherosclerosis. Electronically Signed   By: Ilona Sorrel M.D.   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 She presents for placement of a palliative EXAM: Combined fluoroscopic and CT-guided placement of a percutaneous gastrostomy tube MEDICATIONS: Patient is on scheduled intravenous Rocephin. No additional antibiotic prophylaxis was administered. ANESTHESIA/SEDATION: Versed 1 mg IV; Fentanyl 50 mcg IV Moderate Sedation Time:  30 The patient was continuously monitored during the procedure by the interventional radiology nurse under my direct supervision. CONTRAST:  0 - administered into the gastric lumen. FLUOROSCOPY TIME:  Fluoroscopy Time: 0 minutes 6 seconds (1 mGy). COMPLICATIONS: None immediate. PROCEDURE: Informed written consent was obtained from the patient after a thorough discussion of the procedural risks, benefits and alternatives. All questions were addressed. Maximal Sterile Barrier Technique was utilized including caps,  mask, sterile gowns, sterile gloves, sterile drape, hand hygiene and skin antiseptic. A timeout was performed prior to the initiation of the procedure. The patient was initially taken to Interventional Radiology and a Kumpe the catheter was then introduced through the mouth and into the stomach. Catheter placement within the stomach was confirmed with fluoroscopic saved image. The patient was then transported to CT which she was placed supine on the CT gantry. Stomach was insufflated with air and a planning axial CT scan was performed. There is sufficient displacement of the small bowel and colon after gastric insufflation to allow for gastrostomy tube placement. Therefore, a suitable skin entry site was selected and marked. Skin was prepped and draped in the standard fashion using chlorhexidine skin prep. Local anesthesia was attained by infiltration with 1% lidocaine. A small dermatotomy was made. An 18 gauge sheath needle was then advanced to the abdominal wall and into the stomach. Needle placement within the stomach without transgress shadowing and the adjacent organs was confirmed with CT imaging. A 0.035 wire was then advanced through the sheath needle and the needle was removed. The skin tract was then dilated to 63 [place] and a 12 [place] Wills-Ogelsby gastrostomy tube was advanced over the wire and formed within the stomach. A final postprocedural CT scan of the abdomen and pelvis was obtained confirming tube location within the stomach. The tube was then secured to the skin with 0 Prolene suture. The patient tolerated the procedure well. IMPRESSION: Successful placement of a percutaneous 35 French gastrostomy tube for gastric decompression using a combination of fluoroscopy and CT guidance. Tube is ready for use as a gastric decompression device immediately. Signed, Criselda Peaches, MD [facility] Electronically Signed   By: Jacqulynn Cadet  M.D.   On: 12/12/2016 17:20   Ct Biopsy  Result Date: 12/10/2016 CLINICAL DATA:  History of fallopian tube cancer. Large pelvic mass. EXAM: CT GUIDED CORE BIOPSY OF PELVIC  MASS ANESTHESIA/SEDATION: Intravenous Fentanyl and Versed were administered as conscious sedation during continuous monitoring of the patient's level of consciousness and physiological / cardiorespiratory status by the radiology RN, with a total moderate sedation time of 6 minutes. PROCEDURE: The procedure risks, benefits, and alternatives were explained to the patient. Questions regarding the procedure were encouraged and answered. The patient understands and consents to the procedure. Select axial scans through the pelvis were obtained. The mass was localized and an appropriate skin entry site was determined and marked. The operative field was prepped with chlorhexidinein a sterile fashion, and a sterile drape was applied covering the operative field. A sterile gown and sterile gloves were used for the procedure. Local anesthesia was provided with 1% Lidocaine. Under CT fluoroscopic guidance, a 17 gauge trocar needle was advanced to the margin of the lesion. Once needle tip position was confirmed, coaxial 18-gauge core biopsy samples were obtained, submitted in formalin to surgical pathology. The guide needle was removed. Postprocedure scans show no hemorrhage or other apparent complication. The patient tolerated the procedure well. COMPLICATIONS: None immediate FINDINGS: Large mass occupying most of the pelvis with central low-attenuation regions and gas, as noted on prior CT. Representative core biopsy samples of the peripheral right lower aspect of the lesion obtained without complication. IMPRESSION: 1. Technically successful CT-guided core biopsy,  pelvic mass Electronically Signed   By: Lucrezia Europe M.D.   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 She presents for placement of a palliative EXAM: Combined fluoroscopic and CT-guided placement of a percutaneous gastrostomy tube MEDICATIONS: Patient is on scheduled intravenous Rocephin. No additional antibiotic prophylaxis was administered. ANESTHESIA/SEDATION: Versed 1 mg IV; Fentanyl 50 mcg IV Moderate Sedation Time:  30 The patient was continuously monitored during the procedure by the interventional radiology nurse under my direct supervision. CONTRAST:  0 - administered into the gastric lumen. FLUOROSCOPY TIME:  Fluoroscopy Time: 0 minutes 6 seconds (1 mGy). COMPLICATIONS: None immediate. PROCEDURE: Informed written consent was obtained from the patient after a thorough discussion of the procedural risks, benefits and alternatives. All questions were addressed. Maximal Sterile Barrier Technique was utilized including caps, mask, sterile gowns, sterile gloves, sterile drape, hand hygiene and skin antiseptic. A timeout was performed prior to the initiation of the procedure. The patient was initially taken to Interventional Radiology and a Kumpe the catheter was then introduced through the mouth and into the stomach. Catheter placement within the stomach was confirmed with fluoroscopic saved image. The patient was then transported to CT which she was placed supine on the CT gantry. Stomach was insufflated with air and a planning axial CT scan was performed. There is sufficient displacement of the small bowel and colon after gastric insufflation to allow for gastrostomy tube placement. Therefore, a suitable skin entry site was selected and marked. Skin was prepped and draped in the standard fashion using chlorhexidine skin prep. Local anesthesia was attained by infiltration with 1% lidocaine. A small dermatotomy was made. An 18 gauge sheath needle was then advanced to the abdominal wall and into the stomach. Needle placement  within the stomach without transgress shadowing and the adjacent organs was confirmed with CT imaging. A 0.035 wire was then advanced through the sheath needle and the needle was removed. The skin tract was then dilated to 81 [place] and a 12 [place] Wills-Ogelsby gastrostomy tube was advanced over  the wire and formed within the stomach. A final postprocedural CT scan of the abdomen and pelvis was obtained confirming tube location within the stomach. The tube was then secured to the skin with 0 Prolene suture. The patient tolerated the procedure well. IMPRESSION: Successful placement of a percutaneous 66 French gastrostomy tube for gastric decompression using a combination of fluoroscopy and CT guidance. Tube is ready for use as a gastric decompression device immediately. Signed, Criselda Peaches, MD [facility] Electronically Signed   By: Jacqulynn Cadet M.D.   On: 12/12/2016 17:20    Labs:  CBC:  Recent Labs  12/09/16 0611 12/09/16 1208 12/10/16 0530 12/11/16 0656  WBC 12.5* 13.3* 12.6* 10.5  HGB 9.7* 9.6* 9.1* 8.7*  HCT 30.1* 29.3* 27.8* 27.3*  PLT 384 370 370 337    COAGS:  Recent Labs  12/08/16 1253 12/09/16 0611  INR 1.11  --   APTT  --  32    BMP:  Recent Labs  12/08/16 2330 12/09/16 0611 12/10/16 0530 12/11/16 0656  NA 135 136 138 141  K 3.2* 3.5 3.7 3.5  CL 102 101 105 108  CO2 14* 17* 18* 17*  GLUCOSE 98 102* 92 86  BUN 26* 27* 26* 36*  CALCIUM 8.3* 8.3* 8.1* 8.3*  CREATININE 1.61* 1.60* 1.50* 1.69*  GFRNONAA 31* 31* 33* 29*  GFRAA 36* 36* 39* 34*    LIVER FUNCTION TESTS:  Recent Labs  12/08/16 1253 12/09/16 0611 12/10/16 0530  BILITOT 1.3* 1.3* 1.1  AST 25 26 25   ALT 7* 8* 8*  ALKPHOS 42 50 53  PROT 5.4* 5.7* 5.4*  ALBUMIN 2.5* 2.5* 2.2*    Assessment and Plan: Large intraabdominal carcinoma causing bowel obstruction Patient s/p G-tube for decompression 4/28 by Dr. Laurence Ferrari.    Patient resting comfortably at time of visit.  Plan is for d/c to Hospice services today.  Insertion site intact and secured with sutures.  Ok to use.   Electronically Signed: Docia Barrier 12/13/2016, 12:43 PM   I spent a total of 15 Minutes at the the patient's bedside AND on the patient's hospital floor or unit, greater than 50% of which was counseling/coordinating care for small bowel obstruction.

## 2016-12-13 NOTE — Discharge Summary (Addendum)
Physician Discharge Summary  Joanne Copeland IEP:[id number] DOB: 09-02-1940 DOA: 12/08/2016  PCP: Myriam Jacobson, MD  Admit date: 12/08/2016 Discharge date: 12/14/2016  Admitted From: [facility] Disposition:  [facility]  Recommendations for Outpatient Follow-up:  1. Follow up with PCP as needed 2. Follow up with hospice staff   Discharge Condition:Stable CODE STATUS:DNR Diet recommendation: Full Comfort   Brief/Interim Summary: 76 year old female history of fallopian tube cancer treated 3 years ago, hypertension, hyperlipidemia who presented with dark stools. Patient found to have a hemoglobin of 6.9. Patient was admitted for GI bleed and possible ascites. Ultrasound was done showing a huge pelvic mass. GI was consulted for EGD. Patient also now has possible hydronephrosis, small bowel obstruction. Gynecology oncology, oncology, general surgery, urology consulted.  Pelvic Mass -With history of fallopian tube cancer 3 years ago -Patient noted to have marked distention on physical exam -Abdominal ultrasound showed large solid pelvic mass with maximum diameter of 28 cm, patient with bilateral moderate hydronephrosis, right angiomyolipoma  -Oncology, Dr. Alvy Bimler. Patient seen and case discussed with Dr. Alvy Bimler and Dr. Lucia Gaskins. Little to offer per Dr. Lucia Gaskins. Per Dr. 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 Patient became anuric and more lethargic. Discussed with Palliative care. Prognosis is likely less than one week. Recommend disposition at residential hospice  GI bleed/acute blood loss anemia -Hemoglobin on admission 6.9 -Noted to be FOBT positive -Patient received 2 units PRBC, hemoglobin currently 9.1 -Gastroenterology consult and appreciated, pt  underwent EGD 4/24. No significant lesions in upper GI tract  Small bowel obstruction with nausea/vomiting -Abdominal x-ray showed small bowel dilatation with multiple air-fluid levels concerning for small bowel obstruction versus enteritis -NG removed for comfort -IR has placed g tube for palliation per above  Acute kidney injury/bilateral hydronephrosis -Likely secondary to pelvic mass -Baseline creatinine 1, upon admission, creatinine 1.63 -Urology consulted and appreciated -Discussed case with Dr. Tresa Moore. No plan for neph tubes or stents -focus on comfort at this time. Stable  Hypokalemia -Replaced - stable currently  Fever/Leukocytosis(SIRS vs Sepsis) -Likely secondary to the above -Blood culture showed no growth to date -Patient had been continued on empiric ceftriaxone -d/c antibiotic at discharge  High anion gap metabolic acidosis -Lactic acid 1.4 -Suspect due to SBO  Thrombocytosis -Resolved -Stable currently  Pedal edema -Possibly related to the above -Echocardiogram was performed  Prolong QT  -Likely due to hypokalemia, continue telemetry monitoring -Stable while admitted  Hypoalbuminemia/weight loss -Likely secondary to metastatic disease -Will allow comfort feeds when no longer NPO per IR  Discharge Diagnoses:  Principal Problem:   Melena Active Problems:   Fallopian tube cancer, carcinoma (HCC)   Hypokalemia   Acute blood loss anemia   Thrombocytosis (HCC)   Serum total bilirubin elevated   High anion gap metabolic acidosis   Prolonged QT interval   Ascites   Pedal edema   Abdominal pain   Palliative care encounter   Pelvic mass   Goals of care, counseling/discussion    Discharge Instructions   Allergies as of 12/14/2016      Reactions   Food Anaphylaxis, Other (See Comments)   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    Latex Rash      Medication List    STOP taking these medications   amLODipine 5 MG tablet Commonly known as:  NORVASC   aspirin EC 325 MG tablet   cholecalciferol 1000 units tablet Commonly known as:  VITAMIN D   lactobacillus acidophilus Tabs tablet   omega-3 acid ethyl esters 1 g capsule Commonly known as:  LOVAZA   pravastatin 10 MG tablet Commonly known as:  PRAVACHOL   prenatal multivitamin Tabs tablet   vitamin B-12 1000 MCG tablet Commonly known as:  CYANOCOBALAMIN   Vitamin E 100 units Tabs     TAKE these medications   feeding supplement Liqd Take 1 Container by mouth 3 (three) times daily between meals.   folic acid 1 MG tablet Commonly known as:  FOLVITE Take 1 mg by mouth daily.   morphine CONCENTRATE 10 MG/0.5ML Soln concentrated solution Place 0.5-0.75 mLs (10-15 mg total) under the tongue every 2 (two) hours as needed for severe pain or shortness of breath.   ondansetron 4 MG tablet Commonly known as:  ZOFRAN Take 1 tablet (4 mg total) by mouth every 8 (eight) hours as needed for nausea or vomiting.   sennosides-docusate sodium 8.6-50 MG tablet Commonly known as:  SENOKOT-S Take 1-2 tablets by mouth 2 (two) times daily as needed for constipation.      Follow-up Information    ROBERTS, Sharol Given, MD. Schedule an appointment as soon as possible for a visit.   Specialty:  Internal Medicine Why:  as needed Contact information: [address], [address] [place] [address] [phone]          Allergies  Allergen Reactions  . 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   . Latex Rash    Consultations:  Urology  General Surgery  Oncology  Gyn Onc  IR  Palliative Care  Procedures/Studies: Ct Abdomen Pelvis Wo Contrast  Result Date: 12/09/2016 CLINICAL DATA:  76 year old female with a history of TAHBSO in 2015 with 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 Patient admitted with abdominal distention, bowel obstruction, bilateral hydronephrosis and large pelvic mass at sonography. EXAM: CT  CHEST, ABDOMEN AND PELVIS WITHOUT CONTRAST TECHNIQUE: Multidetector CT imaging of the chest, abdomen and pelvis was performed following the standard protocol without IV contrast. COMPARISON:  Abdominal sonogram from earlier today. 06/05/2015 chest CT. 08/07/2014 CT abdomen/pelvis. FINDINGS: CT CHEST FINDINGS Cardiovascular: Normal heart size. Trace pericardial effusion/ thickening is not appreciably changed. Left anterior descending coronary atherosclerosis. Right internal jugular MediPort terminates in the middle third of the superior vena cava. Atherosclerotic nonaneurysmal thoracic aorta. Normal caliber pulmonary arteries. Mediastinum/Nodes: No discrete thyroid nodules. Enteric tube terminates in the body of the stomach. Oral contrast in the mid to lower thoracic esophagus suggests mild gastroesophageal reflux. No pathologically enlarged axillary, mediastinal or gross hilar lymph nodes, noting limited sensitivity for the detection of hilar adenopathy on this noncontrast study. Lungs/Pleura: No pneumothorax. Trace dependent bilateral pleural effusions. Subsegmental compressive atelectasis in the dependent lower lobes bilaterally. Otherwise no acute consolidative airspace disease. No lung masses or significant pulmonary nodules in the aerated portions of the lungs. Musculoskeletal: No aggressive appearing focal osseous lesions. Mild thoracic spondylosis. CT ABDOMEN PELVIS FINDINGS Hepatobiliary: Normal liver with no liver mass. Mildly distended gallbladder. No radiopaque cholelithiasis. No definite gallbladder wall thickening. No pericholecystic fluid. No biliary ductal dilatation. Pancreas: Normal, with no mass or duct dilation. Spleen: Normal size. No mass. Adrenals/Urinary Tract: Normal adrenals. Moderate to marked bilateral hydroureteronephrosis to the level of the lower lumbar ureters bilaterally. No urolithiasis. Simple 1.6 cm renal cyst in the anterior interpolar right  kidney. Mixed density 2.5 x 2.3 cm  renal cortical mass in the posterior upper right kidney (series 2/ image 58) with significant internal regions of macroscopic fat density, increased from 1.8 x 1.6 cm, compatible with a growing renal angiomyolipoma. Simple partially exophytic 1.5 cm renal cyst in the lateral lower left kidney. No additional contour deforming renal lesions. Compressed, nearly collapsed and grossly normal bladder. Stomach/Bowel: Collapsed and grossly normal stomach noting enteric tube tip in the distal body of the stomach. There is a very large predominantly solid 30.9 x 20.2 x 23.9 cm mass filling much of the lower abdomen and pelvis (series 2/ image 82), which is new since 08/07/2014, which is continuous inferiorly with the vaginal cuff, which contains heterogeneous internal density including significant central regions of gas. This large mass is intimately associated with multiple small bowel loops and with the sigmoid colon. There is a suggestion of direct fistulization of the mass with the mid sigmoid lumen (series 5/images 59-61). The small bowel is decompressed with no significantly dilated small bowel loops. Oral contrast extends only to the mid small bowel. Distal small bowel loops are collapsed. Large bowel is relatively collapsed with mild stool throughout the large bowel. No regions of small or large bowel wall thickening. Vascular/Lymphatic: Atherosclerotic nonaneurysmal abdominal aorta. No pathologically enlarged lymph nodes in the abdomen or pelvis. Reproductive: Status post hysterectomy and bilateral oophorectomy. Other: No free intraperitoneal air. No focal fluid collections. There is compression of the common iliac veins bilaterally by the mass with anasarca throughout the pelvis. Musculoskeletal: No aggressive appearing focal osseous lesions. Mild lumbar spondylosis. IMPRESSION: 1. Very large predominantly solid 30.9 x 20.2 x 23.9 cm mass filling much of the lower abdomen and pelvis, continuous inferiorly with the  vaginal cuff, intimately associated with multiple small bowel loops and with the sigmoid colon, with probable direct fistulization of the mass with the mid sigmoid lumen, with large amount of gas centrally within the mass. Findings are compatible with recurrent neoplasm. 2. No evidence of metastatic disease in the chest. No evidence of nodal, visceral or osseous metastatic disease. 3. Bilateral hydroureteronephrosis due to distal lumbar ureteral obstruction by the mass. 4. Trace dependent bilateral pleural effusions. Compression of the common iliac veins by the mass with pelvic anasarca. 5. 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 6. Aortic atherosclerosis.  One vessel coronary atherosclerosis. Electronically Signed   By: Ilona Sorrel M.D.   On: 12/09/2016 20:59   Ct Chest Wo Contrast  Result Date: 12/09/2016 CLINICAL DATA:  76 year old female with a history of TAHBSO in 2015 with 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 Patient admitted with abdominal distention, bowel obstruction, bilateral hydronephrosis and large pelvic mass at sonography. EXAM: CT CHEST, ABDOMEN AND PELVIS WITHOUT CONTRAST TECHNIQUE: Multidetector CT imaging of the chest, abdomen and pelvis was performed following the standard protocol without IV contrast. COMPARISON:  Abdominal sonogram from earlier today. 06/05/2015 chest CT. 08/07/2014 CT abdomen/pelvis. FINDINGS: CT CHEST FINDINGS Cardiovascular: Normal heart size. Trace pericardial effusion/ thickening is not appreciably changed. Left anterior descending coronary atherosclerosis. Right internal jugular MediPort terminates in the middle third of the superior vena cava. Atherosclerotic nonaneurysmal thoracic aorta. Normal caliber pulmonary arteries. Mediastinum/Nodes: No discrete thyroid nodules. Enteric tube terminates in the body of the stomach. Oral contrast in the mid to lower thoracic esophagus suggests mild gastroesophageal  reflux. No pathologically enlarged axillary, mediastinal or gross hilar lymph nodes, noting limited sensitivity for the detection of hilar  adenopathy on this noncontrast study. Lungs/Pleura: No pneumothorax. Trace dependent bilateral pleural effusions. Subsegmental compressive atelectasis in the dependent lower lobes bilaterally. Otherwise no acute consolidative airspace disease. No lung masses or significant pulmonary nodules in the aerated portions of the lungs. Musculoskeletal: No aggressive appearing focal osseous lesions. Mild thoracic spondylosis. CT ABDOMEN PELVIS FINDINGS Hepatobiliary: Normal liver with no liver mass. Mildly distended gallbladder. No radiopaque cholelithiasis. No definite gallbladder wall thickening. No pericholecystic fluid. No biliary ductal dilatation. Pancreas: Normal, with no mass or duct dilation. Spleen: Normal size. No mass. Adrenals/Urinary Tract: Normal adrenals. Moderate to marked bilateral hydroureteronephrosis to the level of the lower lumbar ureters bilaterally. No urolithiasis. Simple 1.6 cm renal cyst in the anterior interpolar right kidney. Mixed density 2.5 x 2.3 cm renal cortical mass in the posterior upper right kidney (series 2/ image 58) with significant internal regions of macroscopic fat density, increased from 1.8 x 1.6 cm, compatible with a growing renal angiomyolipoma. Simple partially exophytic 1.5 cm renal cyst in the lateral lower left kidney. No additional contour deforming renal lesions. Compressed, nearly collapsed and grossly normal bladder. Stomach/Bowel: Collapsed and grossly normal stomach noting enteric tube tip in the distal body of the stomach. There is a very large predominantly solid 30.9 x 20.2 x 23.9 cm mass filling much of the lower abdomen and pelvis (series 2/ image 82), which is new since 08/07/2014, which is continuous inferiorly with the vaginal cuff, which contains heterogeneous internal density including significant central regions of gas.  This large mass is intimately associated with multiple small bowel loops and with the sigmoid colon. There is a suggestion of direct fistulization of the mass with the mid sigmoid lumen (series 5/images 59-61). The small bowel is decompressed with no significantly dilated small bowel loops. Oral contrast extends only to the mid small bowel. Distal small bowel loops are collapsed. Large bowel is relatively collapsed with mild stool throughout the large bowel. No regions of small or large bowel wall thickening. Vascular/Lymphatic: Atherosclerotic nonaneurysmal abdominal aorta. No pathologically enlarged lymph nodes in the abdomen or pelvis. Reproductive: Status post hysterectomy and bilateral oophorectomy. Other: No free intraperitoneal air. No focal fluid collections. There is compression of the common iliac veins bilaterally by the mass with anasarca throughout the pelvis. Musculoskeletal: No aggressive appearing focal osseous lesions. Mild lumbar spondylosis. IMPRESSION: 1. Very large predominantly solid 30.9 x 20.2 x 23.9 cm mass filling much of the lower abdomen and pelvis, continuous inferiorly with the vaginal cuff, intimately associated with multiple small bowel loops and with the sigmoid colon, with probable direct fistulization of the mass with the mid sigmoid lumen, with large amount of gas centrally within the mass. Findings are compatible with recurrent neoplasm. 2. No evidence of metastatic disease in the chest. No evidence of nodal, visceral or osseous metastatic disease. 3. Bilateral hydroureteronephrosis due to distal lumbar ureteral obstruction by the mass. 4. Trace dependent bilateral pleural effusions. Compression of the common iliac veins by the mass with pelvic anasarca. 5. 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 6. Aortic atherosclerosis.  One vessel coronary atherosclerosis. Electronically Signed   By: Ilona Sorrel M.D.   On: 12/09/2016 20:59   US  Abdomen Complete  Result Date: 12/09/2016 CLINICAL DATA:  Abdominal distention.  Ovarian cancer . EXAM: ABDOMEN ULTRASOUND COMPLETE COMPARISON:  K[facility] 12/08/2016.  CT 08/07/2014 . FINDINGS: Gallbladder: No gallstones or wall thickening visualized. No sonographic Murphy sign noted by sonographer. Common bile duct: Diameter: 6.9 mm  Liver: No focal lesion identified. Within normal limits in parenchymal echogenicity. IVC: No abnormality visualized. Pancreas: Not visualized. Spleen: Size and appearance within normal limits. Right Kidney: Length: 10.4 cm. Echogenicity within normal limits. Moderate hydronephrosis noted . 3.2 x 2.6 x 2.7 cm solid hyperechoic lesion right upper renal pole . The lesion has increased in size from 1.9 cm. 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 1.8 x 1.9 x 2.4 cm simple cyst lower pole right kidney again noted. Left Kidney: Length: 9.8 cm. Echogenicity within normal limits. Moderate hydronephrosis noted. 1.1 x 0.7 x 0.9 cm simple cyst. Abdominal aorta: No aneurysm visualized. Other findings: Large solid pelvic mass measuring 28.0 x 17.0 x 24.3 cm IMPRESSION: 1. Large solid pelvic mass with maximum diameter of 28.0 cm noted. 2. Bilateral moderate hydronephrosis. 2 3.2 cm hyperechoic lesion right upper renal pole. 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 Electronically Signed   By: Marcello Moores  Register   On: 12/09/2016 11:39   Ir Fluoro Rm 30-60 Min  Result Date: 12/12/2016 INDICATION: 76 year old female with small bowel obstruction secondary to fallopian tube carcinoma and peritoneal carcinomatosis. She presents for placement of a palliative EXAM: Combined fluoroscopic and CT-guided placement of a percutaneous gastrostomy tube MEDICATIONS: Patient is on scheduled intravenous Rocephin. No additional antibiotic prophylaxis was administered.  ANESTHESIA/SEDATION: Versed 1 mg IV; Fentanyl 50 mcg IV Moderate Sedation Time:  30 The patient was continuously monitored during the procedure by the interventional radiology nurse under my direct supervision. CONTRAST:  0 - administered into the gastric lumen. FLUOROSCOPY TIME:  Fluoroscopy Time: 0 minutes 6 seconds (1 mGy). COMPLICATIONS: None immediate. PROCEDURE: Informed written consent was obtained from the patient after a thorough discussion of the procedural risks, benefits and alternatives. All questions were addressed. Maximal Sterile Barrier Technique was utilized including caps, mask, sterile gowns, sterile gloves, sterile drape, hand hygiene and skin antiseptic. A timeout was performed prior to the initiation of the procedure. The patient was initially taken to Interventional Radiology and a [place] the catheter was then introduced through the mouth and into the stomach. Catheter placement within the stomach was confirmed with fluoroscopic saved image. The patient was then transported to CT which she was placed supine on the CT gantry. Stomach was insufflated with air and a planning axial CT scan was performed. There is sufficient displacement of the small bowel and colon after gastric insufflation to allow for gastrostomy tube placement. Therefore, a suitable skin entry site was selected and marked. Skin was prepped and draped in the standard fashion using chlorhexidine skin prep. Local anesthesia was attained by infiltration with 1% lidocaine. A small dermatotomy was made. An 18 gauge sheath needle was then advanced to the abdominal wall and into the stomach. Needle placement within the stomach without transgress shadowing and the adjacent organs was confirmed with CT imaging. A 0.035 wire was then advanced through the sheath needle and the needle was removed. The skin tract was then dilated to 42 [place] and a 12 [place] Wills-Ogelsby gastrostomy tube was advanced over the wire and formed within the  stomach. A final postprocedural CT scan of the abdomen and pelvis was obtained confirming tube location within the stomach. The tube was then secured to the skin with 0 Prolene suture. The patient tolerated the procedure well. IMPRESSION: Successful placement of a percutaneous 36 French gastrostomy tube for gastric decompression using a combination of  fluoroscopy and CT guidance. Tube is ready for use as a gastric decompression device immediately. Signed, Criselda Peaches, MD [facility] Radiology Electronically Signed   By: Jacqulynn Cadet M.D.   On: 12/12/2016 17:20   Ct Biopsy  Result Date: 12/10/2016 CLINICAL DATA:  History of fallopian tube cancer. Large pelvic mass. EXAM: CT GUIDED CORE BIOPSY OF PELVIC MASS ANESTHESIA/SEDATION: Intravenous Fentanyl and Versed were administered as conscious sedation during continuous monitoring of the patient's level of consciousness and physiological / cardiorespiratory status by the radiology RN, with a total moderate sedation time of 6 minutes. PROCEDURE: The procedure risks, benefits, and alternatives were explained to the patient. Questions regarding the procedure were encouraged and answered. The patient understands and consents to the procedure. Select axial scans through the pelvis were obtained. The mass was localized and an appropriate skin entry site was determined and marked. The operative field was prepped with chlorhexidinein a sterile fashion, and a sterile drape was applied covering the operative field. A sterile gown and sterile gloves were used for the procedure. Local anesthesia was provided with 1% Lidocaine. Under CT fluoroscopic guidance, a 17 gauge trocar needle was advanced to the margin of the lesion. Once needle tip position was confirmed, coaxial 18-gauge core biopsy samples were obtained, submitted in formalin to surgical pathology. The guide needle was removed. Postprocedure scans show no  hemorrhage or other apparent complication. The patient tolerated the procedure well. COMPLICATIONS: None immediate FINDINGS: Large mass occupying most of the pelvis with central low-attenuation regions and gas, as noted on prior CT. Representative core biopsy samples of the peripheral right lower aspect of the lesion obtained without complication. IMPRESSION: 1. Technically successful CT-guided core biopsy,  pelvic mass Electronically Signed   By: Lucrezia Europe M.D.   On: 12/10/2016 16:36   Dg Abd Acute W/chest  Result Date: 12/08/2016 CLINICAL DATA:  Patient has had abdominal distention for 2 months. Went to her PCP and they told her her bowels were locked up and gave her a prescription for xanax. Patient stated it helped and she started using the bathroom again. EXAM: DG ABDOMEN ACUTE W/ 1V CHEST COMPARISON:  None. FINDINGS: Small bowel dilatation with multiple air-fluid levels. Small bowel measures up to 3.3 cm. Large amount of stool in the ascending colon. No radiopaque calculi or other significant radiographic abnormality is seen. Heart size and mediastinal contours are within normal limits. Both lungs are clear. Right-sided Port-A-Cath in satisfactory position. IMPRESSION: Small bowel dilatation with multiple air-fluid levels concerning for small bowel obstruction versus enteritis. No acute cardiopulmonary disease. Electronically Signed   By: Kathreen Devoid   On: 12/08/2016 14:23   Dg Abd Portable 1v  Result Date: 12/08/2016 CLINICAL DATA:  Nasogastric tube placement.  Initial encounter. EXAM: PORTABLE ABDOMEN - 1 VIEW COMPARISON:  Abdominal radiograph performed earlier today at 2:07 p.m. FINDINGS: The patient's enteric tube is noted coiled at the body of the stomach, ending at the gastroesophageal junction. The stomach is relatively decompressed. There appears to be displacement of bowel by a large lower abdominal mass, likely reflecting the patient's known ovarian cancer. No free intra-abdominal air is  seen. The visualized lung bases are clear. Mild degenerative change is noted at the lower lumbar spine. IMPRESSION: 1. Enteric tube noted coiled at the body of the stomach, ending at the gastroesophageal junction. The stomach is relatively decompressed. 2. Displacement of bowel by a large lower abdominal mass, likely reflecting the patient's known ovarian cancer. No free  intra-abdominal air seen. Electronically Signed   By: Garald Balding M.D.   On: 12/08/2016 19:44   Ct Image Guided Fluid Drain By Catheter  Result Date: 12/12/2016 INDICATION: 76 year old female with small bowel obstruction secondary to fallopian tube carcinoma and peritoneal carcinomatosis. She presents for placement of a palliative EXAM: Combined fluoroscopic and CT-guided placement of a percutaneous gastrostomy tube MEDICATIONS: Patient is on scheduled intravenous Rocephin. No additional antibiotic prophylaxis was administered. ANESTHESIA/SEDATION: Versed 1 mg IV; Fentanyl 50 mcg IV Moderate Sedation Time:  30 The patient was continuously monitored during the procedure by the interventional radiology nurse under my direct supervision. CONTRAST:  0 - administered into the gastric lumen. FLUOROSCOPY TIME:  Fluoroscopy Time: 0 minutes 6 seconds (1 mGy). COMPLICATIONS: None immediate. PROCEDURE: Informed written consent was obtained from the patient after a thorough discussion of the procedural risks, benefits and alternatives. All questions were addressed. Maximal Sterile Barrier Technique was utilized including caps, mask, sterile gowns, sterile gloves, sterile drape, hand hygiene and skin antiseptic. A timeout was performed prior to the initiation of the procedure. The patient was initially taken to Interventional Radiology and a [place] the catheter was then introduced through the mouth and into the stomach. Catheter placement within the stomach was confirmed with fluoroscopic saved image. The patient was then transported to CT which she was  placed supine on the CT gantry. Stomach was insufflated with air and a planning axial CT scan was performed. There is sufficient displacement of the small bowel and colon after gastric insufflation to allow for gastrostomy tube placement. Therefore, a suitable skin entry site was selected and marked. Skin was prepped and draped in the standard fashion using chlorhexidine skin prep. Local anesthesia was attained by infiltration with 1% lidocaine. A small dermatotomy was made. An 18 gauge sheath needle was then advanced to the abdominal wall and into the stomach. Needle placement within the stomach without transgress shadowing and the adjacent organs was confirmed with CT imaging. A 0.035 wire was then advanced through the sheath needle and the needle was removed. The skin tract was then dilated to 16 [place] and a 12 [place] Wills-Ogelsby gastrostomy tube was advanced over the wire and formed within the stomach. A final postprocedural CT scan of the abdomen and pelvis was obtained confirming tube location within the stomach. The tube was then secured to the skin with 0 Prolene suture. The patient tolerated the procedure well. IMPRESSION: Successful placement of a percutaneous 89 French gastrostomy tube for gastric decompression using a combination of fluoroscopy and CT guidance. Tube is ready for use as a gastric decompression device immediately. Signed, Criselda Peaches, MD [facility] Radiology Electronically Signed   By: Jacqulynn Cadet M.D.   On: 12/12/2016 17:20    Subjective: Complaining of abd pain  Discharge Exam: Vitals:   12/13/16 2203 12/14/16 0521  BP: 113/68 (!) 101/54  Pulse: 98 99  Resp: 16 18  Temp: 97.8 F (36.6 C) 98.1 F (36.7 C)   Vitals:   12/13/16 0456 12/13/16 1240 12/13/16 2203 12/14/16 0521  BP:  (!) 101/52 113/68 (!) 101/54  Pulse:  87 98 99  Resp:  16 16 18   Temp:  97.5 F (36.4 C) 97.8 F (36.6 C) 98.1 F (36.7 C)   TempSrc:  Oral Oral Axillary  SpO2:  99% 97% 98%  Weight: 83.1 kg (183 lb 3.2 oz)     Height:        General: Lethargic, arousable, grimacing in  pain Cardiovascular: RRR, S1/S2 +, no rubs, no gallops Respiratory: CTA bilaterally, no wheezing, no rhonchi Abdominal: Soft, NT, ND, bowel sounds + Extremities: no edema, no cyanosis   The results of significant diagnostics from this hospitalization (including imaging, microbiology, ancillary and laboratory) are listed below for reference.     Microbiology: Recent Results (from the past 240 hour(s))  Culture, blood (Routine X 2) w Reflex to ID Panel     Status: Abnormal   Collection Time: 12/08/16  4:06 PM  Result Value Ref Range Status   Specimen Description LEFT ANTECUBITAL  Final   Special Requests   Final    BOTTLES DRAWN AEROBIC AND ANAEROBIC Blood Culture adequate volume   Culture  Setup Time   Final    GRAM POSITIVE COCCI IN CLUSTERS AEROBIC BOTTLE ONLY CRITICAL RESULT CALLED TO, READ BACK BY AND VERIFIED WITH: Melodye Ped Pharm.D. 17:10 12/09/16 ([email or web address])    Culture (A)  Final    STAPHYLOCOCCUS SPECIES (COAGULASE NEGATIVE) THE SIGNIFICANCE OF ISOLATING THIS ORGANISM FROM A SINGLE SET OF BLOOD CULTURES WHEN MULTIPLE SETS ARE DRAWN IS UNCERTAIN. PLEASE NOTIFY THE MICROBIOLOGY DEPARTMENT WITHIN ONE WEEK IF SPECIATION AND SENSITIVITIES ARE REQUIRED. Performed at [facility], [place] [address] [place], [place] [address]    Report Status 12/10/2016 FINAL  Final  Blood Culture ID Panel (Reflexed)     Status: Abnormal   Collection Time: 12/08/16  4:06 PM  Result Value Ref Range Status   Enterococcus species NOT DETECTED NOT DETECTED Final   Listeria monocytogenes NOT DETECTED NOT DETECTED Final   Staphylococcus species DETECTED (A) NOT DETECTED Final    Comment: Methicillin (oxacillin) susceptible coagulase negative staphylococcus. Possible blood culture contaminant (unless isolated from more than one blood culture draw or  clinical case suggests pathogenicity). No antibiotic treatment is indicated for blood  culture contaminants. CRITICAL RESULT CALLED TO, READ BACK BY AND VERIFIED WITH: J. Scherrie November Pharm.D. 17:10 12/09/16 ([email or web address])    Staphylococcus aureus NOT DETECTED NOT DETECTED Final   Methicillin resistance NOT DETECTED NOT DETECTED Final   Streptococcus species NOT DETECTED NOT DETECTED Final   Streptococcus agalactiae NOT DETECTED NOT DETECTED Final   Streptococcus pneumoniae NOT DETECTED NOT DETECTED Final   Streptococcus pyogenes NOT DETECTED NOT DETECTED Final   Acinetobacter baumannii NOT DETECTED NOT DETECTED Final   Enterobacteriaceae species NOT DETECTED NOT DETECTED Final   Enterobacter cloacae complex NOT DETECTED NOT DETECTED Final   Escherichia coli NOT DETECTED NOT DETECTED Final   Klebsiella oxytoca NOT DETECTED NOT DETECTED Final   Klebsiella pneumoniae NOT DETECTED NOT DETECTED Final   Proteus species NOT DETECTED NOT DETECTED Final   Serratia marcescens NOT DETECTED NOT DETECTED Final   Haemophilus influenzae NOT DETECTED NOT DETECTED Final   Neisseria meningitidis NOT DETECTED NOT DETECTED Final   Pseudomonas aeruginosa NOT DETECTED NOT DETECTED Final   Candida albicans NOT DETECTED NOT DETECTED Final   Candida glabrata NOT DETECTED NOT DETECTED Final   Candida krusei NOT DETECTED NOT DETECTED Final   Candida parapsilosis NOT DETECTED NOT DETECTED Final   Candida tropicalis NOT DETECTED NOT DETECTED Final    Comment: Performed at [facility], [place] [address], [place], [place] [address]  Culture, blood (Routine X 2) w Reflex to ID Panel     Status: None   Collection Time: 12/08/16  4:10 PM  Result Value Ref Range Status   Specimen Description BLOOD RIGHT ANTECUBITAL  Final   Special Requests   Final  BOTTLES DRAWN AEROBIC AND ANAEROBIC Blood Culture adequate volume   Culture   Final    NO GROWTH 5 DAYS Performed at [facility], [place] [address] [place], [place]  [address]    Report Status 12/13/2016 FINAL  Final  MRSA PCR Screening     Status: None   Collection Time: 12/08/16  9:49 PM  Result Value Ref Range Status   MRSA by PCR NEGATIVE NEGATIVE Final    Comment:        The GeneXpert MRSA Assay (FDA approved for NASAL specimens only), is one component of a comprehensive MRSA colonization surveillance program. It is not intended to diagnose MRSA infection nor to guide or monitor treatment for MRSA infections.      Labs: BNP (last 3 results) No results for input(s): BNP in the last 8760 hours. Basic Metabolic Panel:  Recent Labs Lab 12/08/16 1253 12/08/16 1549 12/08/16 2330 12/09/16 0611 12/10/16 0530 12/11/16 0656  NA 136  --  135 136 138 141  K 2.6*  --  3.2* 3.5 3.7 3.5  CL 101  --  102 101 105 108  CO2 17*  --  14* 17* 18* 17*  GLUCOSE 76  --  98 102* 92 86  BUN 25*  --  26* 27* 26* 36*  CREATININE 1.63*  --  1.61* 1.60* 1.50* 1.69*  CALCIUM 8.0*  --  8.3* 8.3* 8.1* 8.3*  MG  --  1.4*  --  1.4*  --   --    Liver Function Tests:  Recent Labs Lab 12/08/16 1253 12/09/16 0611 12/10/16 0530  AST 25 26 25   ALT 7* 8* 8*  ALKPHOS 42 50 53  BILITOT 1.3* 1.3* 1.1  PROT 5.4* 5.7* 5.4*  ALBUMIN 2.5* 2.5* 2.2*    Recent Labs Lab 12/08/16 1253  LIPASE 17    Recent Labs Lab 12/08/16 1253  AMMONIA 16   CBC:  Recent Labs Lab 12/08/16 1253 12/08/16 2330 12/09/16 0611 12/09/16 1208 12/10/16 0530 12/11/16 0656  WBC 10.0 13.2* 12.5* 13.3* 12.6* 10.5  NEUTROABS 7.8*  --   --   --   --   --   HGB 6.9* 8.3* 9.7* 9.6* 9.1* 8.7*  HCT 21.5* 25.7* 30.1* 29.3* 27.8* 27.3*  MCV 82.7 82.4 83.4 83.0 83.2 83.7  PLT 419* 387 384 370 370 337   Cardiac Enzymes:  Recent Labs Lab 12/08/16 1253  TROPONINI <0.03   BNP: Invalid input(s): POCBNP CBG: No results for input(s): GLUCAP in the last 168 hours. D-Dimer No results for input(s): DDIMER in the last 72 hours. Hgb A1c No results for input(s): HGBA1C in the last  72 hours. Lipid Profile No results for input(s): CHOL, HDL, LDLCALC, TRIG, CHOLHDL, LDLDIRECT in the last 72 hours. Thyroid function studies No results for input(s): TSH, T4TOTAL, T3FREE, THYROIDAB in the last 72 hours.  Invalid input(s): FREET3 Anemia work up No results for input(s): VITAMINB12, FOLATE, FERRITIN, TIBC, IRON, RETICCTPCT in the last 72 hours. Urinalysis    Component Value Date/Time   COLORURINE YELLOW 12/08/2016 1457   APPEARANCEUR HAZY (A) 12/08/2016 1457   LABSPEC 1.009 12/08/2016 1457   PHURINE 5.0 12/08/2016 1457   GLUCOSEU NEGATIVE 12/08/2016 1457   HGBUR LARGE (A) 12/08/2016 1457   BILIRUBINUR NEGATIVE 12/08/2016 1457   KETONESUR 20 (A) 12/08/2016 1457   PROTEINUR NEGATIVE 12/08/2016 1457   UROBILINOGEN 1.0 09/21/2010 1220   NITRITE NEGATIVE 12/08/2016 1457   LEUKOCYTESUR LARGE (A) 12/08/2016 1457   Sepsis Labs  Invalid input(s): PROCALCITONIN,  WBC,  LACTICIDVEN Microbiology Recent Results (from the past 240 hour(s))  Culture, blood (Routine X 2) w Reflex to ID Panel     Status: Abnormal   Collection Time: 12/08/16  4:06 PM  Result Value Ref Range Status   Specimen Description LEFT ANTECUBITAL  Final   Special Requests   Final    BOTTLES DRAWN AEROBIC AND ANAEROBIC Blood Culture adequate volume   Culture  Setup Time   Final    GRAM POSITIVE COCCI IN CLUSTERS AEROBIC BOTTLE ONLY CRITICAL RESULT CALLED TO, READ BACK BY AND VERIFIED WITH: Melodye Ped Pharm.D. 17:10 12/09/16 ([email or web address])    Culture (A)  Final    STAPHYLOCOCCUS SPECIES (COAGULASE NEGATIVE) THE SIGNIFICANCE OF ISOLATING THIS ORGANISM FROM A SINGLE SET OF BLOOD CULTURES WHEN MULTIPLE SETS ARE DRAWN IS UNCERTAIN. PLEASE NOTIFY THE MICROBIOLOGY DEPARTMENT WITHIN ONE WEEK IF SPECIATION AND SENSITIVITIES ARE REQUIRED. Performed at [facility], [place] [address] [place], [place] [address]    Report Status 12/10/2016 FINAL  Final  Blood Culture ID Panel (Reflexed)     Status: Abnormal    Collection Time: 12/08/16  4:06 PM  Result Value Ref Range Status   Enterococcus species NOT DETECTED NOT DETECTED Final   Listeria monocytogenes NOT DETECTED NOT DETECTED Final   Staphylococcus species DETECTED (A) NOT DETECTED Final    Comment: Methicillin (oxacillin) susceptible coagulase negative staphylococcus. Possible blood culture contaminant (unless isolated from more than one blood culture draw or clinical case suggests pathogenicity). No antibiotic treatment is indicated for blood  culture contaminants. CRITICAL RESULT CALLED TO, READ BACK BY AND VERIFIED WITH: J. Scherrie November Pharm.D. 17:10 12/09/16 ([email or web address])    Staphylococcus aureus NOT DETECTED NOT DETECTED Final   Methicillin resistance NOT DETECTED NOT DETECTED Final   Streptococcus species NOT DETECTED NOT DETECTED Final   Streptococcus agalactiae NOT DETECTED NOT DETECTED Final   Streptococcus pneumoniae NOT DETECTED NOT DETECTED Final   Streptococcus pyogenes NOT DETECTED NOT DETECTED Final   Acinetobacter baumannii NOT DETECTED NOT DETECTED Final   Enterobacteriaceae species NOT DETECTED NOT DETECTED Final   Enterobacter cloacae complex NOT DETECTED NOT DETECTED Final   Escherichia coli NOT DETECTED NOT DETECTED Final   Klebsiella oxytoca NOT DETECTED NOT DETECTED Final   Klebsiella pneumoniae NOT DETECTED NOT DETECTED Final   Proteus species NOT DETECTED NOT DETECTED Final   Serratia marcescens NOT DETECTED NOT DETECTED Final   Haemophilus influenzae NOT DETECTED NOT DETECTED Final   Neisseria meningitidis NOT DETECTED NOT DETECTED Final   Pseudomonas aeruginosa NOT DETECTED NOT DETECTED Final   Candida albicans NOT DETECTED NOT DETECTED Final   Candida glabrata NOT DETECTED NOT DETECTED Final   Candida krusei NOT DETECTED NOT DETECTED Final   Candida parapsilosis NOT DETECTED NOT DETECTED Final   Candida tropicalis NOT DETECTED NOT DETECTED Final    Comment: Performed at [facility], [place] [address],  [place], [place] [address]  Culture, blood (Routine X 2) w Reflex to ID Panel     Status: None   Collection Time: 12/08/16  4:10 PM  Result Value Ref Range Status   Specimen Description BLOOD RIGHT ANTECUBITAL  Final   Special Requests   Final    BOTTLES DRAWN AEROBIC AND ANAEROBIC Blood Culture adequate volume   Culture   Final    NO GROWTH 5 DAYS Performed at [facility], [place] [address], [place], [place] [address]    Report Status 12/13/2016 FINAL  Final  MRSA PCR Screening     Status: None   Collection Time: 12/08/16  9:49 PM  Result Value Ref Range Status   MRSA by PCR NEGATIVE NEGATIVE Final    Comment:        The GeneXpert MRSA Assay (FDA approved for NASAL specimens only), is one component of a comprehensive MRSA colonization surveillance program. It is not intended to diagnose MRSA infection nor to guide or monitor treatment for MRSA infections.      SIGNED:   Donne Hazel, MD  [facility] 12/14/2016, 11:28 AM  If 7PM-7AM, please contact night-coverage [email or web address] Password [email or web address]

## 2016-12-14 DIAGNOSIS — Z7189 Other specified counseling: Secondary | ICD-10-CM

## 2016-12-14 MED ORDER — MORPHINE SULFATE (CONCENTRATE) 10 MG/0.5ML PO SOLN: 10.0000 mg | ORAL | 0 refills | Status: AC | PRN

## 2016-12-14 NOTE — Progress Notes (Addendum)
PROGRESS NOTE    Joanne Copeland  GYJ:[id number] DOB: [birth year] DOA: 12/08/2016 PCP: Myriam Jacobson, MD    Brief Narrative:  76 year old female history of fallopian tube cancer treated 3 years ago, hypertension, hyperlipidemia who presented with dark stools. Patient found to have a hemoglobin of 6.9. Patient was admitted for GI bleed and possible ascites. Ultrasound was done showing a huge pelvic mass. GI was consulted for EGD. Patient also now has possible hydronephrosis, small bowel obstruction. Gynecology oncology, oncology, general surgery, urology consulted.  Assessment & Plan:   Principal Problem:   Melena Active Problems:   Fallopian tube cancer, carcinoma (HCC)   Hypokalemia   Acute blood loss anemia   Thrombocytosis (HCC)   Serum total bilirubin elevated   High anion gap metabolic acidosis   Prolonged QT interval   Ascites   Pedal edema   Abdominal pain   Palliative care encounter   Pelvic mass   Goals of care, counseling/discussion  Pelvic Mass -With history of fallopian tube cancer 3 years ago -Patient noted to have marked distention on physical exam -Abdominal ultrasound showed large solid pelvic mass with maximum diameter of 28 cm, patient with bilateral moderate hydronephrosis, right angiomyolipoma  -Oncology, Dr. Alvy Bimler. Patient seen and case discussed with Dr. Alvy Bimler and Dr. Lucia Gaskins. Little to offer per Dr. Lucia Gaskins. Per Dr. 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 Patient became anuric and more lethargic. Discussed with Palliative care. Prognosis is likely less than one week. Recommend disposition at residential hospice  GI bleed/acute blood loss anemia -Hemoglobin on admission 6.9 -Noted to be FOBT positive -Patient received 2  units PRBC, hemoglobin currently 9.1 -Gastroenterology consult and appreciated, pt underwent EGD 4/24. No significant lesions in upper GI tract  Small bowel obstruction with nausea/vomiting -Abdominal x-ray showed small bowel dilatation with multiple air-fluid levels concerning for small bowel obstruction versus enteritis -NG removed for comfort -IR has placed g tube for palliation per above  Acute kidney injury/bilateral hydronephrosis -Likely secondary to pelvic mass -Baseline creatinine 1, upon admission, creatinine 1.63 -Urology consulted and appreciated -Discussed case with Dr. Tresa Moore. No plan for neph tubes or stents -focus on comfort at this time. Stable  Hypokalemia -Replaced - stable currently  Fever/Leukocytosis(SIRS vs Sepsis) -Likely secondary to the above -Blood culture showed no growth to date -Patient had been continued on empiric ceftriaxone -d/c antibiotic at discharge  High anion gap metabolic acidosis -Lactic acid 1.4 -Suspect due to SBO  Thrombocytosis -Resolved -Stable currently  Pedal edema -Possibly related to the above -Echocardiogram was performed  Prolong QT  -Likely due to hypokalemia, continue telemetry monitoring -Stablewhile admitted  Hypoalbuminemia/weight loss// severe protein calorie malnutrition -Likely secondary to metastatic disease -Will allow comfort feeds when no longer NPO per IR  DVT prophylaxis: Lovenox subQ Code Status: DNR Family Communication: Pt in room, family at bedside Disposition Plan: Uncertain at this time  Consultants:   Urology  IR  Oncology  Gyn Onc  GI  General Surgery  Procedures:   EGD 4/24  IR guided biopsy of mass 4/25  Antimicrobials: Anti-infectives    Start     Dose/Rate Route Frequency Ordered Stop   12/08/16 1700  cefTRIAXone (ROCEPHIN) 1 g in dextrose 5 % 50 mL IVPB     1 g 100 mL/hr over 30 Minutes Intravenous  Every 24 hours 12/08/16 1606         Subjective: Complaining of abd pain  Objective: Vitals:   12/13/16 0456 12/13/16 1240 12/13/16 2203 12/14/16 0521  BP:  (!) 101/52 113/68 (!) 101/54  Pulse:  87 98 99  Resp:  16 16 18   Temp:  97.5 F (36.4 C) 97.8 F (36.6 C) 98.1 F (36.7 C)  TempSrc:  Oral Oral Axillary  SpO2:  99% 97% 98%  Weight: 83.1 kg (183 lb 3.2 oz)     Height:        Intake/Output Summary (Last 24 hours) at 12/14/16 1128 Last data filed at 12/14/16 0600  Gross per 24 hour  Intake               50 ml  Output                0 ml  Net               50 ml   Filed Weights   12/11/16 0609 12/12/16 0432 12/13/16 0456  Weight: 77.3 kg (170 lb 6.7 oz) 80 kg (176 lb 5.9 oz) 83.1 kg (183 lb 3.2 oz)    Examination:  General exam: lethargic, arousable, appears to be in pain Respiratory system: normal resp effort, no audible wheezing Cardiovascular system: regular rhythm, s1-2 Gastrointestinal system: firm, distended, mildly tender on deeper palpation Central nervous system: no seizures, no tremors Extremities: no cyanosis, no joint deformities Skin: normal skin turgor, no notable skin lesions seen Psychiatry: unable to assess given lethargy  Data Reviewed: I have personally reviewed following labs and imaging studies  CBC:  Recent Labs Lab 12/08/16 1253 12/08/16 2330 12/09/16 0611 12/09/16 1208 12/10/16 0530 12/11/16 0656  WBC 10.0 13.2* 12.5* 13.3* 12.6* 10.5  NEUTROABS 7.8*  --   --   --   --   --   HGB 6.9* 8.3* 9.7* 9.6* 9.1* 8.7*  HCT 21.5* 25.7* 30.1* 29.3* 27.8* 27.3*  MCV 82.7 82.4 83.4 83.0 83.2 83.7  PLT 419* 387 384 370 370 591   Basic Metabolic Panel:  Recent Labs Lab 12/08/16 1253 12/08/16 1549 12/08/16 2330 12/09/16 0611 12/10/16 0530 12/11/16 0656  NA 136  --  135 136 138 141  K 2.6*  --  3.2* 3.5 3.7 3.5  CL 101  --  102 101 105 108  CO2 17*  --  14* 17* 18* 17*  GLUCOSE 76  --  98 102* 92 86  BUN 25*  --  26* 27* 26* 36*  CREATININE 1.63*  --  1.61* 1.60*  1.50* 1.69*  CALCIUM 8.0*  --  8.3* 8.3* 8.1* 8.3*  MG  --  1.4*  --  1.4*  --   --    GFR: Estimated Creatinine Clearance: 30.3 mL/min (A) (by C-G formula based on SCr of 1.69 mg/dL (H)). Liver Function Tests:  Recent Labs Lab 12/08/16 1253 12/09/16 0611 12/10/16 0530  AST 25 26 25   ALT 7* 8* 8*  ALKPHOS 42 50 53  BILITOT 1.3* 1.3* 1.1  PROT 5.4* 5.7* 5.4*  ALBUMIN 2.5* 2.5* 2.2*    Recent Labs Lab 12/08/16 1253  LIPASE 17    Recent Labs Lab 12/08/16 1253  AMMONIA 16   Coagulation Profile:  Recent Labs Lab 12/08/16 1253  INR 1.11   Cardiac Enzymes:  Recent Labs Lab 12/08/16 1253  TROPONINI <0.03   BNP (last 3 results) No results for input(s): PROBNP in the last 8760 hours.  HbA1C: No results for input(s): HGBA1C in the last 72 hours. CBG: No results for input(s): GLUCAP in the last 168 hours. Lipid Profile: No results for input(s): CHOL, HDL, LDLCALC, TRIG, CHOLHDL, LDLDIRECT in the last 72 hours. Thyroid Function Tests: No results for input(s): TSH, T4TOTAL, FREET4, T3FREE, THYROIDAB in the last 72 hours. Anemia Panel: No results for input(s): VITAMINB12, FOLATE, FERRITIN, TIBC, IRON, RETICCTPCT in the last 72 hours. Sepsis Labs:  Recent Labs Lab 12/08/16 1551  LATICACIDVEN 1.4    Recent Results (from the past 240 hour(s))  Culture, blood (Routine X 2) w Reflex to ID Panel     Status: Abnormal   Collection Time: 12/08/16  4:06 PM  Result Value Ref Range Status   Specimen Description LEFT ANTECUBITAL  Final   Special Requests   Final    BOTTLES DRAWN AEROBIC AND ANAEROBIC Blood Culture adequate volume   Culture  Setup Time   Final    GRAM POSITIVE COCCI IN CLUSTERS AEROBIC BOTTLE ONLY CRITICAL RESULT CALLED TO, READ BACK BY AND VERIFIED WITH: Melodye Ped Pharm.D. 17:10 12/09/16 ([email or web address])    Culture (A)  Final    STAPHYLOCOCCUS SPECIES (COAGULASE NEGATIVE) THE SIGNIFICANCE OF ISOLATING THIS ORGANISM FROM A SINGLE SET OF BLOOD CULTURES WHEN  MULTIPLE SETS ARE DRAWN IS UNCERTAIN. PLEASE NOTIFY THE MICROBIOLOGY DEPARTMENT WITHIN ONE WEEK IF SPECIATION AND SENSITIVITIES ARE REQUIRED. Performed at [facility], [place] [address] [place], [place] [address]    Report Status 12/10/2016 FINAL  Final  Blood Culture ID Panel (Reflexed)     Status: Abnormal   Collection Time: 12/08/16  4:06 PM  Result Value Ref Range Status   Enterococcus species NOT DETECTED NOT DETECTED Final   Listeria monocytogenes NOT DETECTED NOT DETECTED Final   Staphylococcus species DETECTED (A) NOT DETECTED Final    Comment: Methicillin (oxacillin) susceptible coagulase negative staphylococcus. Possible blood culture contaminant (unless isolated from more than one blood culture draw or clinical case suggests pathogenicity). No antibiotic treatment is indicated for blood  culture contaminants. CRITICAL RESULT CALLED TO, READ BACK BY AND VERIFIED WITH: J. Scherrie November Pharm.D. 17:10 12/09/16 ([email or web address])    Staphylococcus aureus NOT DETECTED NOT DETECTED Final   Methicillin resistance NOT DETECTED NOT DETECTED Final   Streptococcus species NOT DETECTED NOT DETECTED Final   Streptococcus agalactiae NOT DETECTED NOT DETECTED Final   Streptococcus pneumoniae NOT DETECTED NOT DETECTED Final   Streptococcus pyogenes NOT DETECTED NOT DETECTED Final   Acinetobacter baumannii NOT DETECTED NOT DETECTED Final   Enterobacteriaceae species NOT DETECTED NOT DETECTED Final   Enterobacter cloacae complex NOT DETECTED NOT DETECTED Final   Escherichia coli NOT DETECTED NOT DETECTED Final   Klebsiella oxytoca NOT DETECTED NOT DETECTED Final   Klebsiella pneumoniae NOT DETECTED NOT DETECTED Final   Proteus species NOT DETECTED NOT DETECTED Final   Serratia marcescens NOT DETECTED NOT DETECTED Final   Haemophilus influenzae NOT DETECTED NOT DETECTED Final   Neisseria meningitidis NOT DETECTED NOT DETECTED Final   Pseudomonas aeruginosa NOT DETECTED NOT DETECTED Final   Candida  albicans NOT DETECTED NOT DETECTED Final   Candida glabrata NOT DETECTED NOT DETECTED Final   Candida krusei NOT DETECTED NOT DETECTED Final   Candida parapsilosis NOT DETECTED NOT DETECTED Final   Candida tropicalis NOT DETECTED NOT DETECTED Final    Comment: Performed at [facility], [place] [address], [place],  [address]  Culture, blood (Routine X 2) w Reflex to ID Panel     Status:  None   Collection Time: 12/08/16  4:10 PM  Result Value Ref Range Status   Specimen Description BLOOD RIGHT ANTECUBITAL  Final   Special Requests   Final    BOTTLES DRAWN AEROBIC AND ANAEROBIC Blood Culture adequate volume   Culture   Final    NO GROWTH 5 DAYS Performed at [facility], [address], [place], [place] [address]    Report Status 12/13/2016 FINAL  Final  MRSA PCR Screening     Status: None   Collection Time: 12/08/16  9:49 PM  Result Value Ref Range Status   MRSA by PCR NEGATIVE NEGATIVE Final    Comment:        The GeneXpert MRSA Assay (FDA approved for NASAL specimens only), is one component of a comprehensive MRSA colonization surveillance program. It is not intended to diagnose MRSA infection nor to guide or monitor treatment for MRSA infections.      Radiology Studies: Ir Fluoro Rm 30-60 Min  Result Date: 12/12/2016 INDICATION: 76 year old female with small bowel obstruction secondary to fallopian tube carcinoma and peritoneal carcinomatosis. She presents for placement of a palliative EXAM: Combined fluoroscopic and CT-guided placement of a percutaneous gastrostomy tube MEDICATIONS: Patient is on scheduled intravenous Rocephin. No additional antibiotic prophylaxis was administered. ANESTHESIA/SEDATION: Versed 1 mg IV; Fentanyl 50 mcg IV Moderate Sedation Time:  30 The patient was continuously monitored during the procedure by the interventional radiology nurse under my direct supervision. CONTRAST:  0 - administered into the gastric lumen. FLUOROSCOPY TIME:   Fluoroscopy Time: 0 minutes 6 seconds (1 mGy). COMPLICATIONS: None immediate. PROCEDURE: Informed written consent was obtained from the patient after a thorough discussion of the procedural risks, benefits and alternatives. All questions were addressed. Maximal Sterile Barrier Technique was utilized including caps, mask, sterile gowns, sterile gloves, sterile drape, hand hygiene and skin antiseptic. A timeout was performed prior to the initiation of the procedure. The patient was initially taken to Interventional Radiology and a Kumpe the catheter was then introduced through the mouth and into the stomach. Catheter placement within the stomach was confirmed with fluoroscopic saved image. The patient was then transported to CT which she was placed supine on the CT gantry. Stomach was insufflated with air and a planning axial CT scan was performed. There is sufficient displacement of the small bowel and colon after gastric insufflation to allow for gastrostomy tube placement. Therefore, a suitable skin entry site was selected and marked. Skin was prepped and draped in the standard fashion using chlorhexidine skin prep. Local anesthesia was attained by infiltration with 1% lidocaine. A small dermatotomy was made. An 18 gauge sheath needle was then advanced to the abdominal wall and into the stomach. Needle placement within the stomach without transgress shadowing and the adjacent organs was confirmed with CT imaging. A 0.035 wire was then advanced through the sheath needle and the needle was removed. The skin tract was then dilated to 39 [place] and a 12 [place] Wills-Ogelsby gastrostomy tube was advanced over the wire and formed within the stomach. A final postprocedural CT scan of the abdomen and pelvis was obtained confirming tube location within the stomach. The tube was then secured to the skin with 0 Prolene suture. The patient tolerated the procedure well. IMPRESSION: Successful placement of a percutaneous 45  French gastrostomy tube for gastric decompression using a combination of fluoroscopy and CT guidance. Tube is ready for use as a gastric decompression device immediately. Signed, Criselda Peaches, MD [facility] Radiology Electronically Signed   By: Jacqulynn Cadet M.D.   On: 12/12/2016 17:20   Ct Image Guided Fluid Drain By Catheter  Result Date: 12/12/2016 INDICATION: 76 year old female with small bowel obstruction secondary to fallopian tube carcinoma and peritoneal carcinomatosis. She presents for placement of a palliative EXAM: Combined fluoroscopic and CT-guided placement of a percutaneous gastrostomy tube MEDICATIONS: Patient is on scheduled intravenous Rocephin. No additional antibiotic prophylaxis was administered. ANESTHESIA/SEDATION: Versed 1 mg IV; Fentanyl 50 mcg IV Moderate Sedation Time:  30 The patient was continuously monitored during the procedure by the interventional radiology nurse under my direct supervision. CONTRAST:  0 - administered into the gastric lumen. FLUOROSCOPY TIME:  Fluoroscopy Time: 0 minutes 6 seconds (1 mGy). COMPLICATIONS: None immediate. PROCEDURE: Informed written consent was obtained from the patient after a thorough discussion of the procedural risks, benefits and alternatives. All questions were addressed. Maximal Sterile Barrier Technique was utilized including caps, mask, sterile gowns, sterile gloves, sterile drape, hand hygiene and skin antiseptic. A timeout was performed prior to the initiation of the procedure. The patient was initially taken to Interventional Radiology and a Kumpe the catheter was then introduced through the mouth and into the stomach. Catheter placement within the stomach was confirmed with fluoroscopic saved image. The patient was then transported to CT which she was placed supine on the CT gantry. Stomach was insufflated with air and a planning axial CT scan was performed. There is  sufficient displacement of the small bowel and colon after gastric insufflation to allow for gastrostomy tube placement. Therefore, a suitable skin entry site was selected and marked. Skin was prepped and draped in the standard fashion using chlorhexidine skin prep. Local anesthesia was attained by infiltration with 1% lidocaine. A small dermatotomy was made. An 18 gauge sheath needle was then advanced to the abdominal wall and into the stomach. Needle placement within the stomach without transgress shadowing and the adjacent organs was confirmed with CT imaging. A 0.035 wire was then advanced through the sheath needle and the needle was removed. The skin tract was then dilated to 15 [place] and a 12 [place] Wills-Ogelsby gastrostomy tube was advanced over the wire and formed within the stomach. A final postprocedural CT scan of the abdomen and pelvis was obtained confirming tube location within the stomach. The tube was then secured to the skin with 0 Prolene suture. The patient tolerated the procedure well. IMPRESSION: Successful placement of a percutaneous 61 French gastrostomy tube for gastric decompression using a combination of fluoroscopy and CT guidance. Tube is ready for use as a gastric decompression device immediately. Signed, Criselda Peaches, MD [facility] Interventional [facility] Radiology Electronically Signed   By: Jacqulynn Cadet M.D.   On: 12/12/2016 17:20    Scheduled Meds: . feeding supplement  1 Container Oral TID BM  . potassium chloride  40 mEq Oral Once  . sodium chloride flush  3 mL Intravenous Q12H   Continuous Infusions: . sodium chloride    . cefTRIAXone (ROCEPHIN)  IV Stopped (12/13/16 1841)     LOS: 6 days   Ellouise Mcwhirter, Orpah Melter, MD [facility] Pager [phone]  If 7PM-7AM, please contact night-coverage [email or web address] Password [facility] 12/14/2016, 11:28 AM

## 2016-12-14 NOTE — Progress Notes (Signed)
Patient is set to discharge to [facility] today. Patient & daughter, Joanne Copeland aware. Discharge packet given to RN, Lesly Rubenstein. [facility] called for transport.     Raynaldo Opitz, [facility] Clinical Social Worker cell #: [phone]

## 2016-12-14 NOTE — Progress Notes (Signed)
Palliative care progress note  Reason for consult: Goals of care, disposition, uncontrolled pain  [name] is an unfortunate 76 year old woman with advanced cancer who is s/p venting PEG tube with plan to transition to SNF with hospice support.  Discharge cancelled due to change in condition with worsening pain.  On examination, she is much less interactive and was reported to be largely non-responsive overnight.  She partially made eye contact but would not verbalize  Diffuse abdominal tenderness on exam.  She has stopped making urine.  She has been receiving morphine concentrate 10mg  overnight, but she is going to continue to require medication titration to ensure her comfort.  She will best be served by residential hospice and I discussed this with her daughter.  I called and discussed with Dr. Wyline Copas who is in agreement with recommendation for residential hospice and prognosis of < 2 weeks.    Plan: 1) Continue morphine concentrate to 10-15mg  every 2 hours as needed.  Backup IV medication available as needed as well. 2) Connect g-tube to bag to drainage and leave open to drain unless she gets medications.  Would clamp bag for 30 minutes after meds and then reopen.  Residential hospice will be able to manage venting g-tube well. 3) This is an acute change in the last 24 hours.  I believe she is transitioning to actively dying.  I anticipate her prognosis to be days to < 2 weeks at best.  As she has continued to decline while having increasing symptom burden, I strongly recommend residential hospice placement rather than SNF if it can be arranged.  Total Time: 40 minutes Greater than 50%  of this time was spent counseling and coordinating care related to the above assessment and plan.  Joanne Rough, MD [facility] [phone]

## 2016-12-14 NOTE — Progress Notes (Signed)
Report called to Quincy Simmonds, RN at [facility]. Awaiting transportation. Will continue to monitor.

## 2016-12-14 NOTE — Progress Notes (Signed)
CSW received call from Dr. Domingo Cocking that patient is appropriate for residential hospice & that patient's daughter is agreeable with plan. CSW confirmed with patient's daughter, Bonnita Nasuti (cell#: [phone]) and that first choice would be [facility]. CSW made referral to Erling Conte, [place] Liaison - awaiting response re: bed availability/eligibility.   Raynaldo Opitz, LCSW Clinical Social Worker cell #: [phone] (weekend coverage)

## 2016-12-14 NOTE — Consult Note (Signed)
[facility] room available for patient today. Met with daughter to complete paper work. RN has called report. Discharge summary has been faxed.   Thank you,  Erling Conte, LCSW [phone]

## 2016-12-15 NOTE — Anesthesia Postprocedure Evaluation (Signed)
Anesthesia Post Note  Patient: Joanne Copeland  Procedure(s) Performed: Procedure(s) (LRB): ESOPHAGOGASTRODUODENOSCOPY (EGD) WITH PROPOFOL (N/A)  Patient location during evaluation: PACU Anesthesia Type: MAC Level of consciousness: awake and alert Pain management: pain level controlled Vital Signs Assessment: post-procedure vital signs reviewed and stable Respiratory status: spontaneous breathing, nonlabored ventilation, respiratory function stable and patient connected to nasal cannula oxygen Cardiovascular status: stable and blood pressure returned to baseline Anesthetic complications: no       Last Vitals:  Vitals:   12/13/16 2203 12/14/16 0521  BP: 113/68 (!) 101/54  Pulse: 98 99  Resp: 16 18  Temp: 36.6 C 36.7 C    Last Pain:  Vitals:   12/14/16 0621  TempSrc:   PainSc: Asleep                 Semisi Biela S

## 2016-12-16 DEATH — deceased

## 2017-01-19 NOTE — Anesthesia Postprocedure Evaluation (Signed)
Anesthesia Post Note  Patient: Joanne Copeland  Procedure(s) Performed: Procedure(s) (LRB): ESOPHAGOGASTRODUODENOSCOPY (EGD) WITH PROPOFOL (N/A)     Anesthesia Post Evaluation  Last Vitals:  Vitals:   12/13/16 2203 12/14/16 0521  BP: 113/68 (!) 101/54  Pulse: 98 99  Resp: 16 18  Temp: 36.6 C 36.7 C    Last Pain:  Vitals:   12/14/16 0621  TempSrc:   PainSc: Asleep                 Nil Bolser S

## 2017-01-19 NOTE — Addendum Note (Signed)
Addendum  created 01/19/17 1342 by Nannette Zill, MD   Sign clinical note    

## 2023-05-26 ENCOUNTER — Other Ambulatory Visit (HOSPITAL_COMMUNITY): Payer: Self-pay
